# Patient Record
Sex: Male | Born: 1935 | Race: White | Hispanic: No | Smoking: Former smoker
Health system: Southern US, Community
[De-identification: ages and names within clinical notes are randomized; demographics above are authoritative.]

## PROBLEM LIST (undated history)

## (undated) DIAGNOSIS — I10 Essential (primary) hypertension: Secondary | ICD-10-CM

## (undated) DIAGNOSIS — K219 Gastro-esophageal reflux disease without esophagitis: Secondary | ICD-10-CM

## (undated) HISTORY — PX: INNER EAR SURGERY: SHX679

## (undated) HISTORY — PX: TONSILLECTOMY: SHX5217

## (undated) HISTORY — DX: Gastro-esophageal reflux disease without esophagitis: K21.9

## (undated) HISTORY — DX: Essential (primary) hypertension: I10

---

## 2007-04-30 ENCOUNTER — Ambulatory Visit: Payer: Self-pay | Admitting: Gastroenterology

## 2008-07-25 HISTORY — PX: COLONOSCOPY: SHX174

## 2009-11-05 ENCOUNTER — Observation Stay: Payer: Self-pay | Admitting: Internal Medicine

## 2011-04-11 ENCOUNTER — Ambulatory Visit: Payer: Self-pay | Admitting: Surgery

## 2011-04-25 ENCOUNTER — Ambulatory Visit: Payer: Self-pay | Admitting: Surgery

## 2012-02-06 ENCOUNTER — Ambulatory Visit: Payer: Self-pay | Admitting: Family Medicine

## 2012-02-22 ENCOUNTER — Ambulatory Visit: Payer: Self-pay | Admitting: Family Medicine

## 2015-05-18 ENCOUNTER — Ambulatory Visit (INDEPENDENT_AMBULATORY_CARE_PROVIDER_SITE_OTHER): Payer: Medicare Other | Admitting: Family Medicine

## 2015-05-18 ENCOUNTER — Encounter: Payer: Self-pay | Admitting: Family Medicine

## 2015-05-18 VITALS — BP 118/80 | HR 78 | Ht 73.0 in | Wt 176.0 lb

## 2015-05-18 DIAGNOSIS — K219 Gastro-esophageal reflux disease without esophagitis: Secondary | ICD-10-CM

## 2015-05-18 DIAGNOSIS — Z23 Encounter for immunization: Secondary | ICD-10-CM

## 2015-05-18 DIAGNOSIS — I10 Essential (primary) hypertension: Secondary | ICD-10-CM

## 2015-05-18 DIAGNOSIS — R079 Chest pain, unspecified: Secondary | ICD-10-CM

## 2015-05-18 DIAGNOSIS — F430 Acute stress reaction: Secondary | ICD-10-CM

## 2015-05-18 MED ORDER — RANITIDINE HCL 300 MG PO CAPS: 300.0000 mg | ORAL_CAPSULE | Freq: Every evening | ORAL | Status: DC

## 2015-05-18 MED ORDER — LISINOPRIL 10 MG PO TABS: 10.0000 mg | ORAL_TABLET | Freq: Every day | ORAL | Status: DC

## 2015-05-18 NOTE — Progress Notes (Signed)
Name: Jonathan Salinas   MRN: [id number]    DOB: 09-Mar-1936   Date:05/18/2015       Progress Note  Subjective  Chief Complaint  Chief Complaint  Patient presents with  . Depression  . Gastroesophageal Reflux    started taking acid reflux med- improved symptoms  . Chest Pain    Depression        This is a chronic problem.  The current episode started more than 1 month ago.   The onset quality is gradual.   The problem occurs daily.  The problem has been waxing and waning since onset.  Associated symptoms include irritable, indigestion and sad.  Associated symptoms include no decreased concentration, no fatigue, no helplessness, no hopelessness, does not have insomnia, no restlessness, no decreased interest, no appetite change, no body aches, no myalgias, no headaches and no suicidal ideas.( irritated with political circumstances)     The symptoms are aggravated by family issues.  Past treatments include nothing.  Past medical history includes anxiety.     Pertinent negatives include no chronic fatigue syndrome and no chronic pain. Gastroesophageal Reflux He complains of chest pain, coughing, heartburn and wheezing. He reports no abdominal pain, no belching, no choking, no dysphagia, no early satiety, no globus sensation, no hoarse voice, no nausea, no sore throat or no stridor. This is a recurrent problem. The current episode started more than 1 year ago. The problem occurs occasionally. The problem has been waxing and waning. The heartburn duration is several minutes. The symptoms are aggravated by certain foods. Pertinent negatives include no anemia, fatigue, melena, muscle weakness, orthopnea or weight loss. He has tried a histamine-2 antagonist for the symptoms. The treatment provided moderate relief. Past procedures include an EGD. 2008.  Chest Pain  This is a recurrent problem. The current episode started in the past 7 days. The onset quality is gradual. The problem occurs intermittently. The  problem has been waxing and waning. The pain is present in the lateral region. Quality: soreness. Associated symptoms include a cough. Pertinent negatives include no abdominal pain, back pain, claudication, diaphoresis, dizziness, exertional chest pressure, fever, headaches, hemoptysis, irregular heartbeat, leg pain, lower extremity edema, malaise/fatigue, nausea, near-syncope, numbness, orthopnea, palpitations, PND, shortness of breath, sputum production, syncope, vomiting or weakness. The cough is non-productive. Nothing worsens the cough.  His past medical history is significant for hypertension.  Pertinent negatives for past medical history include no muscle weakness and no PVD.  Hypertension This is a chronic problem. The current episode started more than 1 year ago. The problem has been gradually improving since onset. The problem is controlled. Associated symptoms include anxiety and chest pain. Pertinent negatives include no blurred vision, headaches, malaise/fatigue, neck pain, orthopnea, palpitations, peripheral edema, PND, shortness of breath or sweats. There are no associated agents to hypertension. Past treatments include ACE inhibitors. The current treatment provides moderate improvement. There are no compliance problems.  There is no history of angina, kidney disease, CVA, heart failure, PVD or retinopathy.    No problem-specific assessment & plan notes found for this encounter.   Past Medical History  Diagnosis Date  . GERD (gastroesophageal reflux disease)   . Hypertension     Past Surgical History  Procedure Laterality Date  . Inner ear surgery      had a perforated eardrum  . Tonsillectomy    . Colonoscopy  2010    cleared for 10 yrs    Family History  Problem Relation Age of Onset  .  Cancer Mother   . Cancer Father     Social History   Social History  . Marital Status: Divorced    Spouse Name: N/A  . Number of Children: N/A  . Years of Education: N/A    Occupational History  . Not on file.   Social History Main Topics  . Smoking status: Former Games developermoker  . Smokeless tobacco: Not on file  . Alcohol Use: No  . Drug Use: No  . Sexual Activity: Yes   Other Topics Concern  . Not on file   Social History Narrative  . No narrative on file    Allergies  Allergen Reactions  . Penicillins      Review of Systems  Constitutional: Negative for fever, chills, weight loss, malaise/fatigue, diaphoresis, appetite change and fatigue.  HENT: Negative for hoarse voice and sore throat.   Eyes: Negative for blurred vision.  Respiratory: Positive for cough and wheezing. Negative for hemoptysis, sputum production, choking and shortness of breath.   Cardiovascular: Positive for chest pain. Negative for palpitations, orthopnea, claudication, leg swelling, syncope, PND and near-syncope.  Gastrointestinal: Positive for heartburn. Negative for dysphagia, nausea, vomiting, abdominal pain, blood in stool and melena.  Musculoskeletal: Negative for myalgias, back pain, muscle weakness and neck pain.  Skin: Negative for rash.  Neurological: Negative for dizziness, weakness, numbness and headaches.  Psychiatric/Behavioral: Positive for depression. Negative for suicidal ideas and decreased concentration. The patient does not have insomnia.      Objective  Filed Vitals:   05/18/15 0931  BP: 118/80  Pulse: 78  Height: 6\' 1"  (1.854 m)  Weight: 176 lb (79.833 kg)    Physical Exam  Constitutional: He is oriented to person, place, and time and well-developed, well-nourished, and in no distress. He is irritable.  HENT:  Head: Normocephalic.  Right Ear: External ear normal.  Left Ear: External ear normal.  Nose: Nose normal.  Mouth/Throat: Oropharynx is clear and moist.  Eyes: Conjunctivae and EOM are normal. Pupils are equal, round, and reactive to light. Right eye exhibits no discharge. Left eye exhibits no discharge. No scleral icterus.  Neck:  Normal range of motion. Neck supple. No JVD present. No tracheal deviation present. No thyromegaly present.  Cardiovascular: Normal rate, regular rhythm, normal heart sounds and intact distal pulses.  Exam reveals no gallop and no friction rub.   No murmur heard. Pulmonary/Chest: Breath sounds normal. No respiratory distress. He has no wheezes. He has no rales.  Abdominal: Soft. Bowel sounds are normal. He exhibits no mass. There is no hepatosplenomegaly. There is no tenderness. There is no rebound, no guarding and no CVA tenderness.  Musculoskeletal: Normal range of motion. He exhibits no edema or tenderness.  Lymphadenopathy:    He has no cervical adenopathy.  Neurological: He is alert and oriented to person, place, and time. He has normal sensation, normal strength, normal reflexes and intact cranial nerves. No cranial nerve deficit.  Skin: Skin is warm. No rash noted.  Psychiatric: Mood and affect normal.      Assessment & Plan  Problem List Items Addressed This Visit    None    Visit Diagnoses    Chest pain, unspecified chest pain type    -  Primary    Relevant Orders    EKG 12-Lead    Gastroesophageal reflux disease, esophagitis presence not specified        Relevant Medications    ranitidine (ZANTAC) 300 MG capsule    Essential hypertension  Relevant Medications    lisinopril (PRINIVIL,ZESTRIL) 10 MG tablet    Other Relevant Orders    Renal Function Panel    Transient situational disturbance        death of brother    Need for influenza vaccination        Relevant Orders    Flu Vaccine QUAD 36+ mos PF IM (Fluarix & Fluzone Quad PF)         Dr. Hayden [facility]  05/18/2015

## 2015-05-19 LAB — RENAL FUNCTION PANEL
ALBUMIN: 4.6 g/dL (ref 3.5–4.8)
BUN/Creatinine Ratio: 13 (ref 10–22)
BUN: 19 mg/dL (ref 8–27)
CO2: 21 mmol/L (ref 18–29)
Calcium: 9.4 mg/dL (ref 8.6–10.2)
Chloride: 102 mmol/L (ref 97–106)
Creatinine, Ser: 1.41 mg/dL — ABNORMAL HIGH (ref 0.76–1.27)
GFR calc non Af Amer: 48 mL/min/{1.73_m2} — ABNORMAL LOW (ref >59)
GFR, EST AFRICAN AMERICAN: 55 mL/min/{1.73_m2} — AB (ref >59)
Glucose: 91 mg/dL (ref 65–99)
PHOSPHORUS: 2.5 mg/dL (ref 2.5–4.5)
Potassium: 4 mmol/L (ref 3.5–5.2)
Sodium: 141 mmol/L (ref 136–144)

## 2015-08-25 ENCOUNTER — Ambulatory Visit (INDEPENDENT_AMBULATORY_CARE_PROVIDER_SITE_OTHER): Payer: Medicare Other | Admitting: Family Medicine

## 2015-08-25 ENCOUNTER — Encounter: Payer: Self-pay | Admitting: Family Medicine

## 2015-08-25 VITALS — BP 120/80 | HR 80 | Temp 97.8°F | Ht 73.0 in | Wt 175.0 lb

## 2015-08-25 DIAGNOSIS — J40 Bronchitis, not specified as acute or chronic: Secondary | ICD-10-CM

## 2015-08-25 DIAGNOSIS — J0100 Acute maxillary sinusitis, unspecified: Secondary | ICD-10-CM

## 2015-08-25 MED ORDER — AZITHROMYCIN 250 MG PO TABS: ORAL_TABLET | ORAL | Status: DC

## 2015-08-25 NOTE — Progress Notes (Signed)
Name: Jonathan Salinas   MRN: [id number]    DOB: 1935/09/06   Date:08/25/2015       Progress Note  Subjective  Chief Complaint  Chief Complaint  Patient presents with  . Sinusitis    cough and cong- clear/ thick production and headache across front of forehead on brow line    Sinusitis This is a recurrent problem. The current episode started 1 to 4 weeks ago. The problem has been waxing and waning since onset. There has been no fever. The pain is mild. Pertinent negatives include no chills, congestion, coughing, diaphoresis, ear pain, headaches, hoarse voice, neck pain, shortness of breath, sinus pressure, sneezing, sore throat or swollen glands. Past treatments include nothing. The treatment provided moderate relief.  Cough This is a new problem. The current episode started in the past 7 days. The problem has been gradually worsening. The cough is non-productive. Associated symptoms include nasal congestion. Pertinent negatives include no chest pain, chills, ear congestion, ear pain, fever, headaches, heartburn, hemoptysis, myalgias, rash, sore throat, shortness of breath, weight loss or wheezing. The treatment provided mild relief. There is no history of environmental allergies.    No problem-specific assessment & plan notes found for this encounter.   Past Medical History  Diagnosis Date  . GERD (gastroesophageal reflux disease)   . Hypertension     Past Surgical History  Procedure Laterality Date  . Inner ear surgery      had a perforated eardrum  . Tonsillectomy    . Colonoscopy  2010    cleared for 10 yrs    Family History  Problem Relation Age of Onset  . Cancer Mother   . Cancer Father     Social History   Social History  . Marital Status: Divorced    Spouse Name: N/A  . Number of Children: N/A  . Years of Education: N/A   Occupational History  . Not on file.   Social History Main Topics  . Smoking status: Former Games developer  . Smokeless tobacco: Not on file   . Alcohol Use: No  . Drug Use: No  . Sexual Activity: Yes   Other Topics Concern  . Not on file   Social History Narrative    Allergies  Allergen Reactions  . Penicillins      Review of Systems  Constitutional: Negative for fever, chills, weight loss, malaise/fatigue and diaphoresis.  HENT: Negative for congestion, ear discharge, ear pain, hoarse voice, sinus pressure, sneezing and sore throat.   Eyes: Negative for blurred vision.  Respiratory: Negative for cough, hemoptysis, sputum production, shortness of breath and wheezing.   Cardiovascular: Negative for chest pain, palpitations and leg swelling.  Gastrointestinal: Negative for heartburn, nausea, abdominal pain, diarrhea, constipation, blood in stool and melena.  Genitourinary: Negative for dysuria, urgency, frequency and hematuria.  Musculoskeletal: Negative for myalgias, back pain, joint pain and neck pain.  Skin: Negative for rash.  Neurological: Negative for dizziness, tingling, sensory change, focal weakness and headaches.  Endo/Heme/Allergies: Negative for environmental allergies and polydipsia. Does not bruise/bleed easily.  Psychiatric/Behavioral: Negative for depression and suicidal ideas. The patient is not nervous/anxious and does not have insomnia.      Objective  Filed Vitals:   08/25/15 0941  BP: 120/80  Pulse: 80  Temp: 97.8 F (36.6 C)  TempSrc: Oral  Height:  (1.854 m)  Weight: 175 lb (79.379 kg)    Physical Exam  Constitutional: He is oriented to person, place, and time and well-developed, well-nourished,  and in no distress.  HENT:  Head: Normocephalic.  Right Ear: External ear normal.  Left Ear: External ear normal.  Nose: Nose normal.  Mouth/Throat: Oropharynx is clear and moist.  Eyes: Conjunctivae and EOM are normal. Pupils are equal, round, and reactive to light. Right eye exhibits no discharge. Left eye exhibits no discharge. No scleral icterus.  Neck: Normal range of motion.  Neck supple. No JVD present. No tracheal deviation present. No thyromegaly present.  Cardiovascular: Normal rate, regular rhythm, normal heart sounds and intact distal pulses.  Exam reveals no gallop and no friction rub.   No murmur heard. Pulmonary/Chest: Breath sounds normal. No respiratory distress. He has no wheezes. He has no rales.  Abdominal: Soft. Bowel sounds are normal. He exhibits no mass. There is no hepatosplenomegaly. There is no tenderness. There is no rebound, no guarding and no CVA tenderness.  Musculoskeletal: Normal range of motion. He exhibits no edema or tenderness.  Lymphadenopathy:    He has no cervical adenopathy.  Neurological: He is alert and oriented to person, place, and time. He has normal sensation, normal strength, normal reflexes and intact cranial nerves. No cranial nerve deficit.  Skin: Skin is warm. No rash noted.  Psychiatric: Mood and affect normal.  Nursing note and vitals reviewed.     Assessment & Plan  Problem List Items Addressed This Visit    None    Visit Diagnoses    Acute maxillary sinusitis, recurrence not specified    -  Primary    Relevant Medications    azithromycin (ZITHROMAX) 250 MG tablet    Bronchitis        Relevant Medications    azithromycin (ZITHROMAX) 250 MG tablet         Dr. Hayden [facility]  08/25/2015

## 2015-11-03 ENCOUNTER — Other Ambulatory Visit: Payer: Self-pay | Admitting: Family Medicine

## 2015-11-20 ENCOUNTER — Ambulatory Visit (INDEPENDENT_AMBULATORY_CARE_PROVIDER_SITE_OTHER): Payer: Medicare Other | Admitting: Family Medicine

## 2015-11-20 ENCOUNTER — Encounter: Payer: Self-pay | Admitting: Family Medicine

## 2015-11-20 VITALS — BP 130/80 | HR 76 | Ht 73.0 in | Wt 175.0 lb

## 2015-11-20 DIAGNOSIS — S338XXA Sprain of other parts of lumbar spine and pelvis, initial encounter: Secondary | ICD-10-CM | POA: Diagnosis not present

## 2015-11-20 DIAGNOSIS — S39012A Strain of muscle, fascia and tendon of lower back, initial encounter: Secondary | ICD-10-CM

## 2015-11-20 LAB — POCT URINALYSIS DIPSTICK
BILIRUBIN UA: NEGATIVE
Blood, UA: NEGATIVE
GLUCOSE UA: NEGATIVE
Ketones, UA: NEGATIVE
LEUKOCYTES UA: NEGATIVE
NITRITE UA: NEGATIVE
PH UA: 6
PROTEIN UA: NEGATIVE
Spec Grav, UA: 1.02
Urobilinogen, UA: 0.2

## 2015-11-20 MED ORDER — MELOXICAM 15 MG PO TABS: 15.0000 mg | ORAL_TABLET | Freq: Every day | ORAL | Status: DC

## 2015-11-20 MED ORDER — CYCLOBENZAPRINE HCL 10 MG PO TABS: 10.0000 mg | ORAL_TABLET | Freq: Three times a day (TID) | ORAL | Status: DC | PRN

## 2015-11-20 NOTE — Progress Notes (Signed)
Name: Jonathan Salinas   MRN: [id number]    DOB: [birth year]   Date:11/20/2015       Progress Note  Subjective  Chief Complaint  Chief Complaint  Patient presents with  . Back Pain    "bending over a car to put a motor in on Monday"- has hurt in lower back around to front    Back Pain This is a new problem. The current episode started in the past 7 days. The problem occurs daily. The problem has been gradually worsening since onset. The pain is present in the lumbar spine. The quality of the pain is described as aching. The pain does not radiate. The pain is at a severity of 8/10. The pain is mild. The pain is the same all the time. The symptoms are aggravated by bending, sitting and twisting. Stiffness is present in the morning. Pertinent negatives include no abdominal pain, bladder incontinence, bowel incontinence, chest pain, dysuria, fever, headaches, numbness, paresis, paresthesias, tingling, weakness or weight loss. He has tried NSAIDs and muscle relaxant for the symptoms. The treatment provided no relief.    No problem-specific assessment & plan notes found for this encounter.   Past Medical History  Diagnosis Date  . GERD (gastroesophageal reflux disease)   . Hypertension     Past Surgical History  Procedure Laterality Date  . Inner ear surgery      had a perforated eardrum  . Tonsillectomy    . Colonoscopy  2010    cleared for 10 yrs    Family History  Problem Relation Age of Onset  . Cancer Mother   . Cancer Father     Social History   Social History  . Marital Status: Divorced    Spouse Name: N/A  . Number of Children: N/A  . Years of Education: N/A   Occupational History  . Not on file.   Social History Main Topics  . Smoking status: Former Games developermoker  . Smokeless tobacco: Not on file  . Alcohol Use: No  . Drug Use: No  . Sexual Activity: Yes   Other Topics Concern  . Not on file   Social History Narrative    Allergies  Allergen Reactions  .  Penicillins      Review of Systems  Constitutional: Negative for fever, chills, weight loss and malaise/fatigue.  HENT: Negative for ear discharge, ear pain and sore throat.   Eyes: Negative for blurred vision.  Respiratory: Negative for cough, sputum production, shortness of breath and wheezing.   Cardiovascular: Negative for chest pain, palpitations and leg swelling.  Gastrointestinal: Negative for heartburn, nausea, abdominal pain, diarrhea, constipation, blood in stool, melena and bowel incontinence.  Genitourinary: Negative for bladder incontinence, dysuria, urgency, frequency and hematuria.  Musculoskeletal: Positive for back pain. Negative for myalgias, joint pain and neck pain.  Skin: Negative for rash.  Neurological: Negative for dizziness, tingling, sensory change, focal weakness, weakness, numbness, headaches and paresthesias.  Endo/Heme/Allergies: Negative for environmental allergies and polydipsia. Does not bruise/bleed easily.  Psychiatric/Behavioral: Negative for depression and suicidal ideas. The patient is not nervous/anxious and does not have insomnia.      Objective  Filed Vitals:   11/20/15 1429  BP: 130/80  Pulse: 76  Height: 6\' 1"  (1.854 m)  Weight: 175 lb (79.379 kg)    Physical Exam  Constitutional: He is oriented to person, place, and time and well-developed, well-nourished, and in no distress.  HENT:  Head: Normocephalic.  Right Ear: External ear normal.  Left  Ear: External ear normal.  Nose: Nose normal.  Mouth/Throat: Oropharynx is clear and moist.  Eyes: Conjunctivae and EOM are normal. Pupils are equal, round, and reactive to light. Right eye exhibits no discharge. Left eye exhibits no discharge. No scleral icterus.  Neck: Normal range of motion. Neck supple. No JVD present. No tracheal deviation present. No thyromegaly present.  Cardiovascular: Normal rate, regular rhythm, normal heart sounds and intact distal pulses.  Exam reveals no gallop and  no friction rub.   No murmur heard. Pulmonary/Chest: Breath sounds normal. No respiratory distress. He has no wheezes. He has no rales.  Abdominal: Soft. Bowel sounds are normal. He exhibits no mass. There is no hepatosplenomegaly. There is no tenderness. There is no rebound, no guarding and no CVA tenderness.  Genitourinary: Testes/scrotum normal.  Musculoskeletal: Normal range of motion. He exhibits no edema or tenderness.       Lumbar back: He exhibits spasm. He exhibits no tenderness.  Lymphadenopathy:    He has no cervical adenopathy.  Neurological: He is alert and oriented to person, place, and time. He has normal sensation, normal strength, normal reflexes and intact cranial nerves. No cranial nerve deficit.  Skin: Skin is warm. No rash noted.  Psychiatric: Mood and affect normal.  Nursing note and vitals reviewed.     Assessment & Plan  Problem List Items Addressed This Visit    None    Visit Diagnoses    Lumbosacral strain, initial encounter    -  Primary    Relevant Medications    meloxicam (MOBIC) 15 MG tablet    cyclobenzaprine (FLEXERIL) 10 MG tablet    Other Relevant Orders    POCT Urinalysis Dipstick (Completed)         Dr. Hayden [facility]  11/20/2015

## 2015-11-21 ENCOUNTER — Other Ambulatory Visit: Payer: Self-pay | Admitting: Family Medicine

## 2015-12-20 ENCOUNTER — Other Ambulatory Visit: Payer: Self-pay | Admitting: Family Medicine

## 2016-01-27 ENCOUNTER — Other Ambulatory Visit: Payer: Self-pay | Admitting: Family Medicine

## 2016-02-10 ENCOUNTER — Other Ambulatory Visit: Payer: Self-pay

## 2016-02-10 MED ORDER — MELOXICAM 15 MG PO TABS: 15.0000 mg | ORAL_TABLET | Freq: Every day | ORAL | Status: DC

## 2016-03-09 ENCOUNTER — Other Ambulatory Visit: Payer: Self-pay | Admitting: Family Medicine

## 2016-04-06 ENCOUNTER — Other Ambulatory Visit: Payer: Self-pay | Admitting: Family Medicine

## 2016-05-04 ENCOUNTER — Other Ambulatory Visit: Payer: Self-pay | Admitting: Family Medicine

## 2016-05-05 ENCOUNTER — Other Ambulatory Visit: Payer: Self-pay | Admitting: Family Medicine

## 2016-05-10 ENCOUNTER — Other Ambulatory Visit: Payer: Self-pay | Admitting: Family Medicine

## 2016-05-10 ENCOUNTER — Other Ambulatory Visit: Payer: Self-pay

## 2016-05-10 MED ORDER — LISINOPRIL 10 MG PO TABS: 10.0000 mg | ORAL_TABLET | Freq: Every day | ORAL | 0 refills | Status: DC

## 2016-06-08 ENCOUNTER — Other Ambulatory Visit: Payer: Self-pay | Admitting: Family Medicine

## 2016-06-28 ENCOUNTER — Ambulatory Visit (INDEPENDENT_AMBULATORY_CARE_PROVIDER_SITE_OTHER): Payer: Medicare Other | Admitting: Family Medicine

## 2016-06-28 ENCOUNTER — Encounter: Payer: Self-pay | Admitting: Family Medicine

## 2016-06-28 VITALS — BP 136/82 | HR 76 | Temp 98.0°F | Ht 73.0 in | Wt 174.0 lb

## 2016-06-28 DIAGNOSIS — I10 Essential (primary) hypertension: Secondary | ICD-10-CM | POA: Diagnosis not present

## 2016-06-28 DIAGNOSIS — Z1211 Encounter for screening for malignant neoplasm of colon: Secondary | ICD-10-CM | POA: Diagnosis not present

## 2016-06-28 DIAGNOSIS — N400 Enlarged prostate without lower urinary tract symptoms: Secondary | ICD-10-CM | POA: Diagnosis not present

## 2016-06-28 LAB — HEMOCCULT GUIAC POC 1CARD (OFFICE): Fecal Occult Blood, POC: POSITIVE — AB

## 2016-06-28 MED ORDER — LISINOPRIL 10 MG PO TABS: 10.0000 mg | ORAL_TABLET | Freq: Every day | ORAL | 3 refills | Status: DC

## 2016-06-28 NOTE — Progress Notes (Signed)
Name: Virgilio BellingJackie C Reece   MRN: [id number]    DOB: 11-22-1935   Date:06/28/2016       Progress Note  Subjective  Chief Complaint  Chief Complaint  Patient presents with  . Medication Refill    refill Rx lisinopril    Medication Refill  This is a chronic problem. The current episode started more than 1 year ago. The problem occurs daily. The problem has been unchanged. Pertinent negatives include no abdominal pain, chest pain, chills, coughing, fever, headaches, myalgias, nausea, neck pain, rash or sore throat. Nothing aggravates the symptoms. He has tried nothing for the symptoms. The treatment provided mild relief.  Hypertension  This is a chronic problem. The current episode started more than 1 year ago. The problem has been gradually improving since onset. The problem is controlled. Pertinent negatives include no anxiety, blurred vision, chest pain, headaches, malaise/fatigue, neck pain, orthopnea, palpitations, peripheral edema, PND, shortness of breath or sweats. There are no associated agents to hypertension. There are no known risk factors for coronary artery disease. Past treatments include ACE inhibitors. The current treatment provides mild improvement. There are no compliance problems.  There is no history of angina, kidney disease, CAD/MI, CVA, heart failure, left ventricular hypertrophy, PVD, renovascular disease or retinopathy. There is no history of chronic renal disease or a hypertension causing med.    No problem-specific Assessment & Plan notes found for this encounter.   Past Medical History:  Diagnosis Date  . GERD (gastroesophageal reflux disease)   . Hypertension     Past Surgical History:  Procedure Laterality Date  . COLONOSCOPY  2010   cleared for 10 yrs  . INNER EAR SURGERY     had a perforated eardrum  . TONSILLECTOMY      Family History  Problem Relation Age of Onset  . Cancer Mother   . Cancer Father     Social History   Social History  . Marital  status: Divorced    Spouse name: N/A  . Number of children: N/A  . Years of education: N/A   Occupational History  . Not on file.   Social History Main Topics  . Smoking status: Former Games developermoker  . Smokeless tobacco: Not on file  . Alcohol use No  . Drug use: No  . Sexual activity: Yes   Other Topics Concern  . Not on file   Social History Narrative  . No narrative on file    Allergies  Allergen Reactions  . Penicillins      Review of Systems  Constitutional: Negative for chills, fever, malaise/fatigue and weight loss.  HENT: Negative for ear discharge, ear pain and sore throat.   Eyes: Negative for blurred vision.  Respiratory: Negative for cough, sputum production, shortness of breath and wheezing.   Cardiovascular: Negative for chest pain, palpitations, orthopnea, leg swelling and PND.  Gastrointestinal: Negative for abdominal pain, blood in stool, constipation, diarrhea, heartburn, melena and nausea.  Genitourinary: Negative for dysuria, frequency, hematuria and urgency.  Musculoskeletal: Negative for back pain, joint pain, myalgias and neck pain.  Skin: Negative for rash.  Neurological: Negative for dizziness, tingling, sensory change, focal weakness and headaches.  Endo/Heme/Allergies: Negative for environmental allergies and polydipsia. Does not bruise/bleed easily.  Psychiatric/Behavioral: Negative for depression and suicidal ideas. The patient is not nervous/anxious and does not have insomnia.      Objective  Vitals:   06/28/16 1006  BP: 136/82  Pulse: 76  Temp: 98 F (36.7 C)  Weight: 174  lb (78.9 kg)  Height: 6\' 1"  (1.854 m)    Physical Exam  Constitutional: He is oriented to person, place, and time and well-developed, well-nourished, and in no distress.  HENT:  Head: Normocephalic.  Right Ear: External ear normal.  Left Ear: External ear normal.  Nose: Nose normal.  Mouth/Throat: Oropharynx is clear and moist.  Eyes: Conjunctivae and EOM are  normal. Pupils are equal, round, and reactive to light. Right eye exhibits no discharge. Left eye exhibits no discharge. No scleral icterus.  Neck: Normal range of motion. Neck supple. No JVD present. No tracheal deviation present. No thyromegaly present.  Cardiovascular: Normal rate, regular rhythm, normal heart sounds and intact distal pulses.  Exam reveals no gallop and no friction rub.   No murmur heard. Pulmonary/Chest: Breath sounds normal. No respiratory distress. He has no wheezes. He has no rales.  Abdominal: Soft. Bowel sounds are normal. He exhibits no mass. There is no hepatosplenomegaly. There is no tenderness. There is no rebound, no guarding and no CVA tenderness.  Genitourinary: Rectum normal. Prostate is enlarged.  Musculoskeletal: Normal range of motion. He exhibits no edema or tenderness.  Lymphadenopathy:    He has no cervical adenopathy.  Neurological: He is alert and oriented to person, place, and time. He has normal sensation, normal strength, normal reflexes and intact cranial nerves. No cranial nerve deficit.  Skin: Skin is warm. No rash noted.  Psychiatric: Mood and affect normal.  Nursing note and vitals reviewed.     Assessment & Plan  Problem List Items Addressed This Visit    None    Visit Diagnoses    Essential hypertension    -  Primary   Relevant Medications   lisinopril (PRINIVIL,ZESTRIL) 10 MG tablet   Other Relevant Orders   Renal Function Panel   Enlarged prostate       Relevant Orders   PSA   Screen for colon cancer       pt desires to wait before referral   Relevant Orders   POCT Occult Blood Stool (Completed)        Dr. Elizabeth Sauereanna Mollye [facility]  06/28/16

## 2016-06-29 LAB — RENAL FUNCTION PANEL
ALBUMIN: 4.4 g/dL (ref 3.5–4.8)
BUN/Creatinine Ratio: 14 (ref 10–24)
BUN: 19 mg/dL (ref 8–27)
CALCIUM: 9.5 mg/dL (ref 8.6–10.2)
CHLORIDE: 103 mmol/L (ref 96–106)
CO2: 24 mmol/L (ref 18–29)
Creatinine, Ser: 1.38 mg/dL — ABNORMAL HIGH (ref 0.76–1.27)
GFR calc Af Amer: 56 mL/min/{1.73_m2} — ABNORMAL LOW (ref >59)
GFR calc non Af Amer: 49 mL/min/{1.73_m2} — ABNORMAL LOW (ref >59)
GLUCOSE: 98 mg/dL (ref 65–99)
PHOSPHORUS: 2.6 mg/dL (ref 2.5–4.5)
POTASSIUM: 4.5 mmol/L (ref 3.5–5.2)
Sodium: 141 mmol/L (ref 134–144)

## 2016-06-29 LAB — PSA: PROSTATE SPECIFIC AG, SERUM: 5.1 ng/mL — AB (ref 0.0–4.0)

## 2016-06-30 ENCOUNTER — Other Ambulatory Visit: Payer: Self-pay | Admitting: Family Medicine

## 2016-06-30 ENCOUNTER — Other Ambulatory Visit: Payer: Self-pay | Admitting: *Deleted

## 2016-06-30 DIAGNOSIS — N400 Enlarged prostate without lower urinary tract symptoms: Secondary | ICD-10-CM

## 2016-07-04 ENCOUNTER — Other Ambulatory Visit: Payer: Self-pay

## 2016-07-04 DIAGNOSIS — Z1211 Encounter for screening for malignant neoplasm of colon: Secondary | ICD-10-CM

## 2016-07-29 ENCOUNTER — Other Ambulatory Visit: Payer: Self-pay | Admitting: Family Medicine

## 2016-07-29 ENCOUNTER — Encounter: Payer: Self-pay | Admitting: Urology

## 2016-07-29 ENCOUNTER — Ambulatory Visit (INDEPENDENT_AMBULATORY_CARE_PROVIDER_SITE_OTHER): Payer: Medicare Other | Admitting: Urology

## 2016-07-29 VITALS — BP 143/91 | HR 94 | Ht 72.0 in | Wt 175.0 lb

## 2016-07-29 DIAGNOSIS — R972 Elevated prostate specific antigen [PSA]: Secondary | ICD-10-CM | POA: Diagnosis not present

## 2016-07-29 DIAGNOSIS — N400 Enlarged prostate without lower urinary tract symptoms: Secondary | ICD-10-CM

## 2016-07-29 DIAGNOSIS — R351 Nocturia: Secondary | ICD-10-CM | POA: Diagnosis not present

## 2016-07-29 NOTE — Progress Notes (Signed)
07/29/2016 2:42 PM   Virgilio BellingJackie C Hanrahan [birth year] [id number]  Referring provider: Duanne Limerickeanna C Jones, MD [address] [place], [place] [address]  Chief Complaint  Patient presents with  . New Patient (Initial Visit)    HPI:  81 year old male referred for further evaluation of elevated PSA and enlarged prostate on rectal exam. He underwent routine screening by his primary care physician, Dr. Yetta BarreJones at which time an enlarged prostate was noted on digital rectal exam. In addition, he is found to have a PSA of 5.1 on 06/28/2016.  Unfortunately, there are no previous PSA values to review for comparison.  He notes that he had one several years ago which was not elevated.  No family history of prostate cancer.  He denies any significant urinary symptoms. He does have nocturia 2-3 but states this is from drinking water just before bedtime. He has minimal bother from this. Excellent urinary stream, he believes he empties his bladder well.  No urgency or frequency. No gross hematuria or urinary tract infections. No issues with bladder stones.  He does have baseline CKD stage III, which is stable 1.38, stable from 1.4 year ago.   PMH: Past Medical History:  Diagnosis Date  . GERD (gastroesophageal reflux disease)   . Hypertension     Surgical History: Past Surgical History:  Procedure Laterality Date  . COLONOSCOPY  2010   cleared for 10 yrs  . INNER EAR SURGERY     had a perforated eardrum  . TONSILLECTOMY      Home Medications:  Allergies as of 07/29/2016      Reactions   Penicillins       Medication List       Accurate as of 07/29/16  2:42 PM. Always use your most recent med list.          cyclobenzaprine 10 MG tablet Commonly known as:  FLEXERIL Take 1 tablet (10 mg total) by mouth 3 (three) times daily as needed for muscle spasms.   lisinopril 10 MG tablet Commonly known as:  PRINIVIL,ZESTRIL Take 1 tablet (10 mg total) by mouth daily.   meloxicam 15 MG  tablet Commonly known as:  MOBIC TAKE 1 TABLET (15 MG TOTAL) BY MOUTH DAILY.   ranitidine 300 MG capsule Commonly known as:  ZANTAC Take 1 capsule (300 mg total) by mouth every evening.   ranitidine 300 MG tablet Commonly known as:  ZANTAC TAKE 1 TABLET (300 MG TOTAL) BY MOUTH EVERY EVENING.       Allergies:  Allergies  Allergen Reactions  . Penicillins     Family History: Family History  Problem Relation Age of Onset  . Cancer Mother   . Cancer Father   . Prostate cancer Neg Hx   . Bladder Cancer Neg Hx   . Kidney cancer Neg Hx     Social History:  reports that he has quit smoking. He has never used smokeless tobacco. He reports that he does not drink alcohol or use drugs.  ROS: UROLOGY Frequent Urination?: No Hard to postpone urination?: No Burning/pain with urination?: No Get up at night to urinate?: No Leakage of urine?: No Urine stream starts and stops?: No Trouble starting stream?: No Do you have to strain to urinate?: No Blood in urine?: No Urinary tract infection?: No Sexually transmitted disease?: No Injury to kidneys or bladder?: No Painful intercourse?: No Weak stream?: No Erection problems?: No Penile pain?: No  Gastrointestinal Nausea?: No Vomiting?: No Indigestion/heartburn?: No Diarrhea?: No Constipation?:  No  Constitutional Fever: No Night sweats?: No Weight loss?: No Fatigue?: No  Skin Skin rash/lesions?: No Itching?: No  Eyes Blurred vision?: No Double vision?: No  Ears/Nose/Throat Sore throat?: No Sinus problems?: No  Hematologic/Lymphatic Swollen glands?: No Easy bruising?: No  Cardiovascular Leg swelling?: No Chest pain?: No  Respiratory Cough?: No Shortness of breath?: No  Endocrine Excessive thirst?: No  Musculoskeletal Back pain?: No Joint pain?: No  Neurological Headaches?: No Dizziness?: No  Psychologic Depression?: No Anxiety?: No  Physical Exam: BP (!) 143/91 (BP Location: Right Arm,  Patient Position: Sitting, Cuff Size: Normal)   Pulse 94   Ht 6' (1.829 m)   Wt 175 lb (79.4 kg)   BMI 23.73 kg/m   Constitutional:  Alert and oriented, No acute distress. HEENT:  AT, moist mucus membranes.  Trachea midline, no masses. Cardiovascular: No clubbing, cyanosis, or edema. Respiratory: Normal respiratory effort, no increased work of breathing. GI: Abdomen is soft, nontender, nondistended, no abdominal masses GU: No CVA tenderness.  Rectal exam: Normal sphincter tone. Enlarged 50+ cc prostate, nontender, no nodules. Skin: No rashes, bruises or suspicious lesions. Neurologic: Grossly intact, no focal deficits, moving all 4 extremities. Psychiatric: Normal mood and affect.  Laboratory Data:  Lab Results  Component Value Date   CREATININE 1.38 (H) 06/28/2016   Component     Latest Ref Rng & Units 06/28/2016  PSA     0.0 - 4.0 ng/mL 5.1 (H)    Urinalysis    Component Value Date/Time   BILIRUBINUR neg 11/20/2015 1511   PROTEINUR neg 11/20/2015 1511   UROBILINOGEN 0.2 11/20/2015 1511   NITRITE neg 11/20/2015 1511   LEUKOCYTESUR Negative 11/20/2015 1511    Pertinent Imaging: n/a  Assessment & Plan:    1. Elevated PSA Elevated PSA to 5.1, no previous PSA values for comparison   We reviewed the implications of an elevated PSA and the uncertainty surrounding it. In general, a man's PSA increases with age and is produced by both normal and cancerous prostate tissue. Differential for elevated PSA is BPH, prostate cancer, infection, recent intercourse/ejaculation, prostate infarction, recent urethroscopic manipulation (foley placement/cystoscopy) and prostatitis. Management of an elevated PSA can include observation or prostate biopsy and wediscussed this in detail.  We discussed that indications for prostate biopsy are defined by age and race specific PSA cutoffs as well as a PSA velocity of 0.75/year.  Generally, PSA screening is stopped around age 90-75 based on  the patient's overall health. In this situation with a slightly elevated PSA and unknown previous trend, I recommended follow-up PSA in 6 months for further observation. Rectal exam was enlarged, may be an appropriate PSA density.  He is agreeable with this plan.   2. Benign prostatic hyperplasia without lower urinary tract symptoms Enlarged gland on exam today, otherwise asymptomatic No intervention recommended given lack of symptoms  3. Nocturia Voids 2-3 times nightly, likely mostly behavioral Minimal bother    Return in about 6 months (around 01/26/2017) for PSA/ DRE (check labs 1 week prior to apt).  Vanna Scotland, MD  [facility] [address], [address] [place], [place] [address] [phone]

## 2017-02-09 ENCOUNTER — Other Ambulatory Visit
Admission: RE | Admit: 2017-02-09 | Discharge: 2017-02-09 | Disposition: A | Discharge status: Home / Self Care | Payer: Medicare Other | Source: Ambulatory Visit | Attending: Urology | Admitting: Urology

## 2017-02-09 DIAGNOSIS — R972 Elevated prostate specific antigen [PSA]: Secondary | ICD-10-CM | POA: Insufficient documentation

## 2017-02-09 LAB — PSA: Prostatic Specific Antigen: 4.78 ng/mL — ABNORMAL HIGH (ref 0.00–4.00)

## 2017-02-10 ENCOUNTER — Ambulatory Visit (INDEPENDENT_AMBULATORY_CARE_PROVIDER_SITE_OTHER): Payer: Medicare Other | Admitting: Urology

## 2017-02-10 ENCOUNTER — Encounter: Payer: Self-pay | Admitting: Urology

## 2017-02-10 VITALS — BP 140/77 | HR 80 | Ht 73.0 in | Wt 175.0 lb

## 2017-02-10 DIAGNOSIS — N400 Enlarged prostate without lower urinary tract symptoms: Secondary | ICD-10-CM

## 2017-02-10 DIAGNOSIS — R351 Nocturia: Secondary | ICD-10-CM | POA: Diagnosis not present

## 2017-02-10 DIAGNOSIS — R972 Elevated prostate specific antigen [PSA]: Secondary | ICD-10-CM | POA: Diagnosis not present

## 2017-02-10 NOTE — Progress Notes (Signed)
02/10/2017 1:55 PM   Jonathan Salinas 07-11-1936 [id number]  Referring provider: Duanne LimerickJones, Deanna C, MD [address] [place], [place] [address]  Chief Complaint  Patient presents with  . Elevated PSA    60month    HPI:  [age] year old male referred in 07/2016 for evaluation of elevated PSA .  At the time, his PSA of 5.1 was 06/28/2016.  Repeat PSA today is no 4.78, 6 months later. He returns today for routine follow-up.  Rectal exam on 07/2016 showed an enlarged 50+ cc prostate, nonnodular.  No family history of prostate cancer.  He denies any significant urinary symptoms. He does have nocturia 2-3 but states this is from drinking water just before bedtime. He has minimal bother from this. Excellent urinary stream, he believes he empties his bladder well.  No urgency or frequency. No gross hematuria or urinary tract infections. No issues with bladder stones.  This is unchanged from previous visit.  He does have baseline CKD stage III, which is stable 1.38, stable from 1.4 year ago.  He believes that his PSAs down because he stopped taking his other medicines except for lisinopril.  PMH: Past Medical History:  Diagnosis Date  . GERD (gastroesophageal reflux disease)   . Hypertension     Surgical History: Past Surgical History:  Procedure Laterality Date  . COLONOSCOPY  2010   cleared for 10 yrs  . INNER EAR SURGERY     had a perforated eardrum  . TONSILLECTOMY      Home Medications:  Allergies as of 02/10/2017      Reactions   Penicillins       Medication List       Accurate as of 02/10/17  1:55 PM. Always use your most recent med list.          lisinopril 10 MG tablet Commonly known as:  PRINIVIL,ZESTRIL Take 1 tablet (10 mg total) by mouth daily.   ranitidine 300 MG tablet Commonly known as:  ZANTAC TAKE 1 TABLET (300 MG TOTAL) BY MOUTH EVERY EVENING.       Allergies:  Allergies  Allergen Reactions  . Penicillins     Family  History: Family History  Problem Relation Age of Onset  . Cancer Mother   . Cancer Father   . Prostate cancer Neg Hx   . Bladder Cancer Neg Hx   . Kidney cancer Neg Hx     Social History:  reports that he has quit smoking. He has never used smokeless tobacco. He reports that he does not drink alcohol or use drugs.  ROS: UROLOGY Frequent Urination?: No Hard to postpone urination?: No Burning/pain with urination?: No Get up at night to urinate?: No Leakage of urine?: No Urine stream starts and stops?: No Trouble starting stream?: No Do you have to strain to urinate?: No Blood in urine?: No Urinary tract infection?: No Sexually transmitted disease?: No Injury to kidneys or bladder?: No Painful intercourse?: No Weak stream?: No Erection problems?: No Penile pain?: No  Gastrointestinal Nausea?: No Vomiting?: No Indigestion/heartburn?: No Diarrhea?: No Constipation?: No  Constitutional Fever: No Night sweats?: No Weight loss?: No Fatigue?: No  Skin Skin rash/lesions?: No Itching?: No  Eyes Blurred vision?: No Double vision?: No  Ears/Nose/Throat Sore throat?: No Sinus problems?: No  Hematologic/Lymphatic Swollen glands?: No Easy bruising?: No  Cardiovascular Leg swelling?: No Chest pain?: No  Respiratory Cough?: No Shortness of breath?: No  Endocrine Excessive thirst?: No  Musculoskeletal Back pain?: No Joint pain?:  No  Neurological Headaches?: No Dizziness?: No  Psychologic Depression?: No Anxiety?: No  Physical Exam: BP 140/77   Pulse 80   Ht 6\' 1"  (1.854 m)   Wt 175 lb (79.4 kg)   BMI 23.09 kg/m   Constitutional:  Alert and oriented, No acute distress. HEENT: [place] AT, moist mucus membranes.  Trachea midline, no masses. Cardiovascular: No clubbing, cyanosis, or edema. Respiratory: Normal respiratory effort, no increased work of breathing. GU: No CVA tenderness.  Skin: No rashes, bruises or suspicious lesions. Neurologic:  Grossly intact, no focal deficits, moving all 4 extremities. Psychiatric: Normal mood and affect.  Laboratory Data:  Lab Results  Component Value Date   CREATININE 1.38 (H) 06/28/2016   Component     Latest Ref Rng & Units 06/28/2016  PSA     0.0 - 4.0 ng/mL 5.1 (H)   Component     Latest Ref Rng & Units 02/09/2017  Prostatic Specific Antigen     0.00 - 4.00 ng/mL 4.78 (H)    Urinalysis    Component Value Date/Time   BILIRUBINUR neg 11/20/2015 1511   PROTEINUR neg 11/20/2015 1511   UROBILINOGEN 0.2 11/20/2015 1511   NITRITE neg 11/20/2015 1511   LEUKOCYTESUR Negative 11/20/2015 1511    Pertinent Imaging: n/a  Assessment & Plan:    1. Elevated PSA Elevated PSA to 5.1, repeat 4.78 -6 months later Prostate enlargement rectal exam Based on his gland size, suspect this is normal PSA density Will recheck in 1 year He is agreeable witht this plan  2. Benign prostatic hyperplasia without lower urinary tract symptoms Enlarged gland on exam today, otherwise asymptomatic No intervention recommended given lack of symptoms  3. Nocturia Voids 2-3 times nightly, likely mostly behavioral Minimal bother  Return in about 1 year (around 02/10/2018) for PSA / DRE.  Vanna Scotland, MD  [facility] [phone]

## 2017-07-04 ENCOUNTER — Other Ambulatory Visit: Payer: Self-pay | Admitting: Family Medicine

## 2017-07-04 DIAGNOSIS — I10 Essential (primary) hypertension: Secondary | ICD-10-CM

## 2017-07-17 ENCOUNTER — Other Ambulatory Visit: Payer: Self-pay | Admitting: Family Medicine

## 2017-07-17 DIAGNOSIS — I10 Essential (primary) hypertension: Secondary | ICD-10-CM

## 2017-07-19 ENCOUNTER — Ambulatory Visit (INDEPENDENT_AMBULATORY_CARE_PROVIDER_SITE_OTHER): Payer: Medicare Other | Admitting: Family Medicine

## 2017-07-19 ENCOUNTER — Other Ambulatory Visit: Payer: Self-pay

## 2017-07-19 ENCOUNTER — Encounter: Payer: Self-pay | Admitting: Family Medicine

## 2017-07-19 VITALS — BP 122/70 | HR 72 | Ht 73.0 in | Wt 174.0 lb

## 2017-07-19 DIAGNOSIS — I10 Essential (primary) hypertension: Secondary | ICD-10-CM | POA: Diagnosis not present

## 2017-07-19 MED ORDER — LISINOPRIL 10 MG PO TABS: ORAL_TABLET | ORAL | 1 refills | Status: DC

## 2017-07-19 NOTE — Progress Notes (Signed)
Name: Jonathan Salinas   MRN: [id number]    DOB: 1936/01/08   Date:07/19/2017       Progress Note  Subjective  Chief Complaint  Chief Complaint  Patient presents with  . Hypertension  . Gastroesophageal Reflux    Hypertension  This is a chronic problem. The current episode started more than 1 year ago. The problem has been waxing and waning since onset. The problem is controlled. Pertinent negatives include no anxiety, blurred vision, chest pain, headaches, malaise/fatigue, neck pain, orthopnea, palpitations, peripheral edema, PND, shortness of breath or sweats. There are no associated agents to hypertension. There are no known risk factors for coronary artery disease. Past treatments include ACE inhibitors. The current treatment provides moderate improvement. There are no compliance problems.  There is no history of angina, kidney disease, CAD/MI, CVA, heart failure, left ventricular hypertrophy, PVD or retinopathy. There is no history of chronic renal disease, a hypertension causing med or renovascular disease.  Gastroesophageal Reflux  He reports no abdominal pain, no belching, no chest pain, no choking, no coughing, no dysphagia, no early satiety, no globus sensation, no heartburn, no hoarse voice, no nausea, no sore throat, no stridor, no tooth decay or no wheezing. This is a chronic problem. The problem has been gradually improving. The symptoms are aggravated by certain foods. Pertinent negatives include no melena or weight loss.    No problem-specific Assessment & Plan notes found for this encounter.   Past Medical History:  Diagnosis Date  . GERD (gastroesophageal reflux disease)   . Hypertension     Past Surgical History:  Procedure Laterality Date  . COLONOSCOPY  2010   cleared for 10 yrs  . INNER EAR SURGERY     had a perforated eardrum  . TONSILLECTOMY      Family History  Problem Relation Age of Onset  . Cancer Mother   . Cancer Father   . Prostate cancer Neg Hx    . Bladder Cancer Neg Hx   . Kidney cancer Neg Hx     Social History   Socioeconomic History  . Marital status: Divorced    Spouse name: Not on file  . Number of children: Not on file  . Years of education: Not on file  . Highest education level: Not on file  Social Needs  . Financial resource strain: Not on file  . Food insecurity - worry: Not on file  . Food insecurity - inability: Not on file  . Transportation needs - medical: Not on file  . Transportation needs - non-medical: Not on file  Occupational History  . Not on file  Tobacco Use  . Smoking status: Former Games developermoker  . Smokeless tobacco: Never Used  Substance and Sexual Activity  . Alcohol use: No    Alcohol/week: 0.0 oz  . Drug use: No  . Sexual activity: Yes  Other Topics Concern  . Not on file  Social History Narrative  . Not on file    Allergies  Allergen Reactions  . Penicillins     Outpatient Medications Prior to Visit  Medication Sig Dispense Refill  . ranitidine (ZANTAC) 300 MG tablet TAKE 1 TABLET (300 MG TOTAL) BY MOUTH EVERY EVENING. 90 tablet 1  . lisinopril (PRINIVIL,ZESTRIL) 10 MG tablet TAKE 1 TABLET (10 MG TOTAL) BY MOUTH DAILY.NEEDS APPT 15 tablet 0   No facility-administered medications prior to visit.     Review of Systems  Constitutional: Negative for chills, fever, malaise/fatigue and weight loss.  HENT:  Negative for ear discharge, ear pain, hoarse voice and sore throat.   Eyes: Negative for blurred vision.  Respiratory: Negative for cough, sputum production, choking, shortness of breath and wheezing.   Cardiovascular: Negative for chest pain, palpitations, orthopnea, leg swelling and PND.  Gastrointestinal: Negative for abdominal pain, blood in stool, constipation, diarrhea, dysphagia, heartburn, melena and nausea.  Genitourinary: Negative for dysuria, frequency, hematuria and urgency.  Musculoskeletal: Negative for back pain, joint pain, myalgias and neck pain.  Skin: Negative for  rash.  Neurological: Negative for dizziness, tingling, sensory change, focal weakness and headaches.  Endo/Heme/Allergies: Negative for environmental allergies and polydipsia. Does not bruise/bleed easily.  Psychiatric/Behavioral: Negative for depression and suicidal ideas. The patient is not nervous/anxious and does not have insomnia.      Objective  Vitals:   07/19/17 0944  BP: 122/70  Pulse: 72  Weight: 174 lb (78.9 kg)  Height: 6\' 1"  (1.854 m)    Physical Exam  Constitutional: He is oriented to person, place, and time and well-developed, well-nourished, and in no distress.  HENT:  Head: Normocephalic.  Right Ear: External ear normal.  Left Ear: External ear normal.  Nose: Nose normal.  Mouth/Throat: Oropharynx is clear and moist.  Eyes: Conjunctivae and EOM are normal. Pupils are equal, round, and reactive to light. Right eye exhibits no discharge. Left eye exhibits no discharge. No scleral icterus.  Neck: Normal range of motion. Neck supple. No JVD present. No tracheal deviation present. No thyromegaly present.  Cardiovascular: Normal rate, regular rhythm, normal heart sounds and intact distal pulses. Exam reveals no gallop and no friction rub.  No murmur heard. Pulmonary/Chest: Breath sounds normal. No respiratory distress. He has no wheezes. He has no rales.  Abdominal: Soft. Bowel sounds are normal. He exhibits no mass. There is no hepatosplenomegaly. There is no tenderness. There is no rebound, no guarding and no CVA tenderness.  Musculoskeletal: Normal range of motion. He exhibits no edema or tenderness.  Lymphadenopathy:    He has no cervical adenopathy.  Neurological: He is alert and oriented to person, place, and time. He has normal sensation, normal strength, normal reflexes and intact cranial nerves. No cranial nerve deficit.  Skin: Skin is warm. No rash noted.  Psychiatric: Mood and affect normal.  Nursing note and vitals reviewed.     Assessment &  Plan  Problem List Items Addressed This Visit    None    Visit Diagnoses    Essential hypertension    -  Primary   Relevant Medications   lisinopril (PRINIVIL,ZESTRIL) 10 MG tablet   Other Relevant Orders   Renal function panel      Meds ordered this encounter  Medications  . lisinopril (PRINIVIL,ZESTRIL) 10 MG tablet    Sig: TAKE 1 TABLET (10 MG TOTAL) BY MOUTH DAILY.NEEDS APPT    Dispense:  90 tablet    Refill:  1    Needs appt      Dr. Elizabeth Sauereanna Jones [facility]  07/19/17

## 2017-07-20 LAB — RENAL FUNCTION PANEL
Albumin: 4.3 g/dL (ref 3.5–4.8)
BUN / CREAT RATIO: 17 (ref 10–24)
BUN: 21 mg/dL (ref 8–27)
CALCIUM: 9.5 mg/dL (ref 8.6–10.2)
CO2: 21 mmol/L (ref 20–29)
CREATININE: 1.27 mg/dL (ref 0.76–1.27)
Chloride: 106 mmol/L (ref 96–106)
GFR, EST AFRICAN AMERICAN: 62 mL/min/{1.73_m2} (ref >59)
GFR, EST NON AFRICAN AMERICAN: 53 mL/min/{1.73_m2} — AB (ref >59)
Glucose: 90 mg/dL (ref 65–99)
Phosphorus: 2.9 mg/dL (ref 2.5–4.5)
Potassium: 4.7 mmol/L (ref 3.5–5.2)
SODIUM: 143 mmol/L (ref 134–144)

## 2017-10-23 ENCOUNTER — Ambulatory Visit: Payer: Self-pay | Admitting: Family Medicine

## 2017-11-06 ENCOUNTER — Encounter: Payer: Self-pay | Admitting: Family Medicine

## 2017-11-06 ENCOUNTER — Ambulatory Visit (INDEPENDENT_AMBULATORY_CARE_PROVIDER_SITE_OTHER): Payer: Medicare Other | Admitting: Family Medicine

## 2017-11-06 VITALS — BP 138/80 | HR 68 | Ht 73.0 in | Wt 171.0 lb

## 2017-11-06 DIAGNOSIS — F329 Major depressive disorder, single episode, unspecified: Secondary | ICD-10-CM

## 2017-11-06 DIAGNOSIS — F430 Acute stress reaction: Secondary | ICD-10-CM

## 2017-11-06 DIAGNOSIS — F411 Generalized anxiety disorder: Secondary | ICD-10-CM

## 2017-11-06 MED ORDER — SERTRALINE HCL 50 MG PO TABS: 50.0000 mg | ORAL_TABLET | Freq: Every day | ORAL | 3 refills | Status: DC

## 2017-11-06 MED ORDER — ALPRAZOLAM 0.25 MG PO TABS: 0.2500 mg | ORAL_TABLET | Freq: Two times a day (BID) | ORAL | 0 refills | Status: DC | PRN

## 2017-11-06 NOTE — Patient Instructions (Signed)
Stress and Stress Management Stress is a normal reaction to life events. It is what you feel when life demands more than you are used to or more than you can handle. Some stress can be useful. For example, the stress reaction can help you catch the last bus of the day, study for a test, or meet a deadline at work. But stress that occurs too often or for too long can cause problems. It can affect your emotional health and interfere with relationships and normal daily activities. Too much stress can weaken your immune system and increase your risk for physical illness. If you already have a medical problem, stress can make it worse. What are the causes? All sorts of life events may cause stress. An event that causes stress for one person may not be stressful for another person. Major life events commonly cause stress. These may be positive or negative. Examples include losing your job, moving into a new home, getting married, having a baby, or losing a loved one. Less obvious life events may also cause stress, especially if they occur day after day or in combination. Examples include working long hours, driving in traffic, caring for children, being in debt, or being in a difficult relationship. What are the signs or symptoms? Stress may cause emotional symptoms including, the following:  Anxiety. This is feeling worried, afraid, on edge, overwhelmed, or out of control.  Anger. This is feeling irritated or impatient.  Depression. This is feeling sad, down, helpless, or guilty.  Difficulty focusing, remembering, or making decisions.  Stress may cause physical symptoms, including the following:  Aches and pains. These may affect your head, neck, back, stomach, or other areas of your body.  Tight muscles or clenched jaw.  Low energy or trouble sleeping.  Stress may cause unhealthy behaviors, including the following:  Eating to feel better (overeating) or skipping meals.  Sleeping too little,  too much, or both.  Working too much or putting off tasks (procrastination).  Smoking, drinking alcohol, or using drugs to feel better.  How is this diagnosed? Stress is diagnosed through an assessment by your health care provider. Your health care provider will ask questions about your symptoms and any stressful life events.Your health care provider will also ask about your medical history and may order blood tests or other tests. Certain medical conditions and medicine can cause physical symptoms similar to stress. Mental illness can cause emotional symptoms and unhealthy behaviors similar to stress. Your health care provider may refer you to a mental health professional for further evaluation. How is this treated? Stress management is the recommended treatment for stress.The goals of stress management are reducing stressful life events and coping with stress in healthy ways. Techniques for reducing stressful life events include the following:  Stress identification. Self-monitor for stress and identify what causes stress for you. These skills may help you to avoid some stressful events.  Time management. Set your priorities, keep a calendar of events, and learn to say "no." These tools can help you avoid making too many commitments.  Techniques for coping with stress include the following:  Rethinking the problem. Try to think realistically about stressful events rather than ignoring them or overreacting. Try to find the positives in a stressful situation rather than focusing on the negatives.  Exercise. Physical exercise can release both physical and emotional tension. The key is to find a form of exercise you enjoy and do it regularly.  Relaxation techniques. These relax the body and  mind. Examples include yoga, meditation, tai chi, biofeedback, deep breathing, progressive muscle relaxation, listening to music, being out in nature, journaling, and other hobbies. Again, the key is to find  one or more that you enjoy and can do regularly.  Healthy lifestyle. Eat a balanced diet, get plenty of sleep, and do not smoke. Avoid using alcohol or drugs to relax.  Strong support network. Spend time with family, friends, or other people you enjoy being around.Express your feelings and talk things over with someone you trust.  Counseling or talktherapy with a mental health professional may be helpful if you are having difficulty managing stress on your own. Medicine is typically not recommended for the treatment of stress.Talk to your health care provider if you think you need medicine for symptoms of stress. Follow these instructions at home:  Keep all follow-up visits as directed by your health care provider.  Take all medicines as directed by your health care provider. Contact a health care provider if:  Your symptoms get worse or you start having new symptoms.  You feel overwhelmed by your problems and can no longer manage them on your own. Get help right away if:  You feel like hurting yourself or someone else. This information is not intended to replace advice given to you by your health care provider. Make sure you discuss any questions you have with your health care provider. Document Released: 01/04/2001 Document Revised: 12/17/2015 Document Reviewed: 03/05/2013 Elsevier Interactive Patient Education  2017 [employer]  

## 2017-11-06 NOTE — Progress Notes (Signed)
Name: Jonathan Salinas   MRN: [id number]    DOB: 1935-12-10   Date:11/06/2017       Progress Note  Subjective  Chief Complaint  Chief Complaint  Patient presents with  . Anxiety    Anxiety  Presents for initial visit. Onset was 1 to 4 weeks ago. The problem has been waxing and waning. Symptoms include excessive worry, nervous/anxious behavior and panic. Patient reports no chest pain, compulsions, confusion, decreased concentration, depressed mood, dizziness, dry mouth, feeling of choking, hyperventilation, impotence, insomnia, irritability, malaise, muscle tension, nausea, obsessions, palpitations, restlessness, shortness of breath or suicidal ideas. The severity of symptoms is mild. Exacerbated by: stress. The quality of sleep is good.   Risk factors include a major life event. His past medical history is significant for anxiety/panic attacks. Past treatments include nothing. Compliance with prior treatments has been good.    No problem-specific Assessment & Plan notes found for this encounter.   Past Medical History:  Diagnosis Date  . GERD (gastroesophageal reflux disease)   . Hypertension     Past Surgical History:  Procedure Laterality Date  . COLONOSCOPY  2010   cleared for 10 yrs  . INNER EAR SURGERY     had a perforated eardrum  . TONSILLECTOMY      Family History  Problem Relation Age of Onset  . Cancer Mother   . Cancer Father   . Prostate cancer Neg Hx   . Bladder Cancer Neg Hx   . Kidney cancer Neg Hx     Social History   Socioeconomic History  . Marital status: Divorced    Spouse name: Not on file  . Number of children: Not on file  . Years of education: Not on file  . Highest education level: Not on file  Occupational History  . Not on file  Social Needs  . Financial resource strain: Not on file  . Food insecurity:    Worry: Not on file    Inability: Not on file  . Transportation needs:    Medical: Not on file    Non-medical: Not on file   Tobacco Use  . Smoking status: Former Games developer  . Smokeless tobacco: Never Used  Substance and Sexual Activity  . Alcohol use: No    Alcohol/week: 0.0 oz  . Drug use: No  . Sexual activity: Yes  Lifestyle  . Physical activity:    Days per week: Not on file    Minutes per session: Not on file  . Stress: Not on file  Relationships  . Social connections:    Talks on phone: Not on file    Gets together: Not on file    Attends religious service: Not on file    Active member of club or organization: Not on file    Attends meetings of clubs or organizations: Not on file    Relationship status: Not on file  . Intimate partner violence:    Fear of current or ex partner: Not on file    Emotionally abused: Not on file    Physically abused: Not on file    Forced sexual activity: Not on file  Other Topics Concern  . Not on file  Social History Narrative  . Not on file    Allergies  Allergen Reactions  . Penicillins     Outpatient Medications Prior to Visit  Medication Sig Dispense Refill  . lisinopril (PRINIVIL,ZESTRIL) 10 MG tablet TAKE 1 TABLET (10 MG TOTAL) BY MOUTH DAILY.NEEDS APPT 90  tablet 1  . ranitidine (ZANTAC) 300 MG tablet TAKE 1 TABLET (300 MG TOTAL) BY MOUTH EVERY EVENING. 90 tablet 1   No facility-administered medications prior to visit.     Review of Systems  Constitutional: Negative for chills, fever, irritability, malaise/fatigue and weight loss.  HENT: Negative for ear discharge, ear pain and sore throat.   Eyes: Negative for blurred vision.  Respiratory: Negative for cough, sputum production, shortness of breath and wheezing.   Cardiovascular: Negative for chest pain, palpitations and leg swelling.  Gastrointestinal: Negative for abdominal pain, blood in stool, constipation, diarrhea, heartburn, melena and nausea.  Genitourinary: Negative for dysuria, frequency, hematuria, impotence and urgency.  Musculoskeletal: Negative for back pain, joint pain, myalgias  and neck pain.  Skin: Negative for rash.  Neurological: Negative for dizziness, tingling, sensory change, focal weakness and headaches.  Endo/Heme/Allergies: Negative for environmental allergies and polydipsia. Does not bruise/bleed easily.  Psychiatric/Behavioral: Negative for confusion, decreased concentration, depression and suicidal ideas. The patient is nervous/anxious. The patient does not have insomnia.      Objective  Vitals:   11/06/17 0854  BP: 138/80  Pulse: 68  Weight: 171 lb (77.6 kg)  Height: 6\' 1"  (1.854 m)    Physical Exam  Constitutional: He is oriented to person, place, and time.  HENT:  Head: Normocephalic.  Right Ear: External ear normal.  Left Ear: External ear normal.  Nose: Nose normal.  Mouth/Throat: Oropharynx is clear and moist.  Eyes: Pupils are equal, round, and reactive to light. Conjunctivae and EOM are normal. Right eye exhibits no discharge. Left eye exhibits no discharge. No scleral icterus.  Neck: Normal range of motion. Neck supple. No JVD present. No tracheal deviation present. No thyromegaly present.  Cardiovascular: Normal rate, regular rhythm, normal heart sounds and intact distal pulses. Exam reveals no gallop and no friction rub.  No murmur heard. Pulmonary/Chest: Breath sounds normal. No respiratory distress. He has no wheezes. He has no rales.  Abdominal: Soft. Bowel sounds are normal. He exhibits no mass. There is no hepatosplenomegaly. There is no tenderness. There is no rebound, no guarding and no CVA tenderness.  Musculoskeletal: Normal range of motion. He exhibits no edema or tenderness.  Lymphadenopathy:    He has no cervical adenopathy.  Neurological: He is alert and oriented to person, place, and time. He has normal strength and normal reflexes. No cranial nerve deficit.  Skin: Skin is warm. No rash noted.  Nursing note and vitals reviewed.     Assessment & Plan  Problem List Items Addressed This Visit    None    Visit  Diagnoses    Reactive depression    -  Primary   Relevant Medications   sertraline (ZOLOFT) 50 MG tablet   ALPRAZolam (XANAX) 0.25 MG tablet   Anxiety as acute reaction to exceptional stress       Relevant Medications   sertraline (ZOLOFT) 50 MG tablet   ALPRAZolam (XANAX) 0.25 MG tablet      Meds ordered this encounter  Medications  . sertraline (ZOLOFT) 50 MG tablet    Sig: Take 1 tablet (50 mg total) by mouth daily. Start at one half tablet for 10 days    Dispense:  30 tablet    Refill:  3  . ALPRAZolam (XANAX) 0.25 MG tablet    Sig: Take 1 tablet (0.25 mg total) by mouth 2 (two) times daily as needed for anxiety. Mat take one half    Dispense:  20 tablet  Refill:  0      Dr. [facility]  11/06/17

## 2017-12-03 ENCOUNTER — Other Ambulatory Visit: Payer: Self-pay | Admitting: Family Medicine

## 2017-12-03 DIAGNOSIS — I10 Essential (primary) hypertension: Secondary | ICD-10-CM

## 2018-02-09 ENCOUNTER — Ambulatory Visit: Payer: Self-pay | Admitting: Urology

## 2018-03-06 ENCOUNTER — Other Ambulatory Visit: Payer: Self-pay | Admitting: Family Medicine

## 2018-03-06 DIAGNOSIS — I10 Essential (primary) hypertension: Secondary | ICD-10-CM

## 2018-04-30 ENCOUNTER — Encounter: Payer: Self-pay | Admitting: Family Medicine

## 2018-04-30 ENCOUNTER — Ambulatory Visit (INDEPENDENT_AMBULATORY_CARE_PROVIDER_SITE_OTHER): Payer: Medicare Other | Admitting: Family Medicine

## 2018-04-30 VITALS — BP 134/100 | HR 68 | Ht 73.0 in | Wt 172.0 lb

## 2018-04-30 DIAGNOSIS — R1031 Right lower quadrant pain: Secondary | ICD-10-CM | POA: Diagnosis not present

## 2018-04-30 DIAGNOSIS — Z1211 Encounter for screening for malignant neoplasm of colon: Secondary | ICD-10-CM

## 2018-04-30 LAB — HEMOCCULT GUIAC POC 1CARD (OFFICE): Fecal Occult Blood, POC: NEGATIVE

## 2018-04-30 MED ORDER — MELOXICAM 15 MG PO TABS
15.0000 mg | ORAL_TABLET | Freq: Every day | ORAL | 0 refills | Status: DC
Start: 2018-04-30 — End: 2018-05-23

## 2018-04-30 NOTE — Progress Notes (Signed)
Date:  04/30/2018   Name:  Jonathan Salinas   DOB:  [birth year]   MRN:  [id number]   Chief Complaint: Groin Pain (R) groin pain that makes him nauseated. Sometimes is on the L). No problems going to the BR) Groin Pain  The patient's pertinent negatives include no genital injury, genital itching, genital lesions, pelvic pain, scrotal swelling or testicular pain. This is a new problem. The current episode started 1 to 4 weeks ago (2-3 weeks ago). The problem occurs intermittently. The pain is mild. Associated symptoms include nausea. Pertinent negatives include no abdominal pain, chest pain, chills, constipation, coughing, diarrhea, dysuria, fever, flank pain, frequency, headaches, hematuria, hesitancy, joint pain, rash, shortness of breath, sore throat or urgency. Nothing ("I cannot say") aggravates the symptoms. The treatment provided no relief. His past medical history is significant for an inguinal hernia. There is no history of BPH. (Repaired bilateral 2010-2012)    Review of Systems  Constitutional: Negative for chills and fever.  HENT: Negative for drooling, ear discharge, ear pain and sore throat.   Respiratory: Negative for cough, shortness of breath and wheezing.   Cardiovascular: Negative for chest pain, palpitations and leg swelling.  Gastrointestinal: Positive for nausea. Negative for abdominal pain, blood in stool, constipation and diarrhea.  Endocrine: Negative for polydipsia.  Genitourinary: Negative for dysuria, flank pain, frequency, hematuria, hesitancy, pelvic pain, scrotal swelling, testicular pain and urgency.  Musculoskeletal: Negative for back pain, joint pain, myalgias and neck pain.  Skin: Negative for rash.  Allergic/Immunologic: Negative for environmental allergies.  Neurological: Negative for dizziness and headaches.  Hematological: Does not bruise/bleed easily.  Psychiatric/Behavioral: Negative for suicidal ideas. The patient is not nervous/anxious.     There  are no active problems to display for this patient.   Allergies  Allergen Reactions  . Penicillins     Past Surgical History:  Procedure Laterality Date  . COLONOSCOPY  2010   cleared for 10 yrs  . INNER EAR SURGERY     had a perforated eardrum  . TONSILLECTOMY      Social History   Tobacco Use  . Smoking status: Former Games developer  . Smokeless tobacco: Never Used  Substance Use Topics  . Alcohol use: No    Alcohol/week: 0.0 standard drinks  . Drug use: No     Medication list has been reviewed and updated.  Current Meds  Medication Sig  . lisinopril (PRINIVIL,ZESTRIL) 10 MG tablet TAKE 1 TABLET (10 MG TOTAL) BY MOUTH DAILY.NEEDS APPT  . [DISCONTINUED] ALPRAZolam (XANAX) 0.25 MG tablet Take 1 tablet (0.25 mg total) by mouth 2 (two) times daily as needed for anxiety. Mat take one half    PHQ 2/9 Scores 04/30/2018 07/19/2017 08/25/2015 05/18/2015  PHQ - 2 Score 0 0 0 1  PHQ- 9 Score 1 0 - -    Physical Exam  Constitutional: He is oriented to person, place, and time.  HENT:  Head: Normocephalic.  Right Ear: External ear normal.  Left Ear: External ear normal.  Nose: Nose normal.  Mouth/Throat: Oropharynx is clear and moist.  Eyes: Pupils are equal, round, and reactive to light. Conjunctivae and EOM are normal. Right eye exhibits no discharge. Left eye exhibits no discharge. No scleral icterus.  Neck: Normal range of motion. Neck supple. No JVD present. No tracheal deviation present. No thyromegaly present.  Cardiovascular: Normal rate, regular rhythm, normal heart sounds and intact distal pulses. Exam reveals no gallop and no friction rub.  No murmur heard. Pulmonary/Chest:  Breath sounds normal. No respiratory distress. He has no wheezes. He has no rales.  Abdominal: Soft. Bowel sounds are normal. He exhibits no mass. There is no hepatosplenomegaly. There is no tenderness. There is no rebound, no guarding and no CVA tenderness. Hernia confirmed negative in the right  inguinal area and confirmed negative in the left inguinal area.  Genitourinary: Testes normal and penis normal. Right testis shows no mass, no swelling and no tenderness. Left testis shows no mass, no swelling and no tenderness.  Musculoskeletal: Normal range of motion. He exhibits no edema.       Right hip: He exhibits tenderness and deformity. He exhibits normal range of motion, normal strength, no bony tenderness and no swelling.       Legs: Lymphadenopathy:    He has no cervical adenopathy. No inguinal adenopathy noted on the right or left side.  Neurological: He is alert and oriented to person, place, and time. He has normal strength and normal reflexes. No cranial nerve deficit.  Skin: Skin is warm. No rash noted.  Nursing note and vitals reviewed.   BP (!) 134/100   Pulse 68   Ht 6\' 1"  (1.854 m)   Wt 172 lb (78 kg)   BMI 22.69 kg/m   Assessment and Plan:  1. Inguinal pain, right Previous inguinal hernia repair FROM of hip. Tenderness of right right ileo psoas area. Trial meloxicam 15 mg. - meloxicam (MOBIC) 15 MG tablet; Take 1 tablet (15 mg total) by mouth daily.  Dispense: 30 tablet; Refill: 0.   Dr. [facility]  04/30/2018

## 2018-04-30 NOTE — Addendum Note (Signed)
Addended by: Everitt Amber on: 04/30/2018 02:15 PM   Modules accepted: Orders

## 2018-05-23 ENCOUNTER — Other Ambulatory Visit: Payer: Self-pay | Admitting: Family Medicine

## 2018-05-23 DIAGNOSIS — R1031 Right lower quadrant pain: Secondary | ICD-10-CM

## 2018-05-25 ENCOUNTER — Other Ambulatory Visit: Payer: Self-pay | Admitting: Family Medicine

## 2018-05-25 DIAGNOSIS — I10 Essential (primary) hypertension: Secondary | ICD-10-CM

## 2018-06-19 ENCOUNTER — Ambulatory Visit (INDEPENDENT_AMBULATORY_CARE_PROVIDER_SITE_OTHER): Payer: Medicare Other | Admitting: Family Medicine

## 2018-06-19 ENCOUNTER — Encounter: Payer: Self-pay | Admitting: Family Medicine

## 2018-06-19 VITALS — BP 150/80 | HR 88 | Ht 73.0 in | Wt 176.0 lb

## 2018-06-19 DIAGNOSIS — R079 Chest pain, unspecified: Secondary | ICD-10-CM | POA: Diagnosis not present

## 2018-06-19 DIAGNOSIS — R0789 Other chest pain: Secondary | ICD-10-CM | POA: Diagnosis not present

## 2018-06-19 DIAGNOSIS — I4510 Unspecified right bundle-branch block: Secondary | ICD-10-CM

## 2018-06-19 NOTE — Progress Notes (Signed)
Date:  06/19/2018   Name:  Jonathan Salinas   DOB:  March 09, 1936   MRN:  [id number]   Chief Complaint: Shoulder Pain (hurting across shoulders and upper chest after carrying a back pack leaf blower around blowing leaves. Did experience some SOB, but that has resolved.) Chest Pain   This is a new problem. The current episode started in the past 7 days (Sunday). The onset quality is sudden. Episode frequency: one episodeduration 6-7 hrs. The problem has been unchanged. The pain is present in the substernal region (across chest). The pain is at a severity of 6/10. The pain is mild. Quality: soreness. The pain does not radiate. Associated symptoms include leg pain and shortness of breath. Pertinent negatives include no abdominal pain, back pain, claudication, cough, diaphoresis, dizziness, exertional chest pressure, fever, headaches, hemoptysis, irregular heartbeat, lower extremity edema, malaise/fatigue, nausea, near-syncope, numbness, orthopnea, palpitations, PND, sputum production, syncope, vomiting or weakness. Associated symptoms comments: Pain in calves. He has tried nothing for the symptoms.     Review of Systems  Constitutional: Negative for chills, diaphoresis, fever and malaise/fatigue.  HENT: Negative for drooling, ear discharge, ear pain and sore throat.   Respiratory: Positive for shortness of breath. Negative for cough, hemoptysis, sputum production and wheezing.   Cardiovascular: Positive for chest pain. Negative for palpitations, orthopnea, claudication, leg swelling, syncope, PND and near-syncope.  Gastrointestinal: Negative for abdominal pain, blood in stool, constipation, diarrhea, nausea and vomiting.  Endocrine: Negative for polydipsia.  Genitourinary: Negative for dysuria, frequency, hematuria and urgency.  Musculoskeletal: Negative for back pain, myalgias and neck pain.  Skin: Negative for rash.  Allergic/Immunologic: Negative for environmental allergies.  Neurological:  Negative for dizziness, weakness, numbness and headaches.  Hematological: Does not bruise/bleed easily.  Psychiatric/Behavioral: Negative for suicidal ideas. The patient is not nervous/anxious.     There are no active problems to display for this patient.   Allergies  Allergen Reactions  . Penicillins     Past Surgical History:  Procedure Laterality Date  . COLONOSCOPY  2010   cleared for 10 yrs  . INNER EAR SURGERY     had a perforated eardrum  . TONSILLECTOMY      Social History   Tobacco Use  . Smoking status: Former Games developermoker  . Smokeless tobacco: Never Used  Substance Use Topics  . Alcohol use: No    Alcohol/week: 0.0 standard drinks  . Drug use: No     Medication list has been reviewed and updated.  Current Meds  Medication Sig  . lisinopril (PRINIVIL,ZESTRIL) 10 MG tablet TAKE 1 TABLET (10 MG TOTAL) BY MOUTH DAILY.NEEDS APPT    PHQ 2/9 Scores 06/19/2018 04/30/2018 07/19/2017 08/25/2015  PHQ - 2 Score 0 0 0 0  PHQ- 9 Score 0 1 0 -    Physical Exam  Constitutional: He is oriented to person, place, and time.  HENT:  Head: Normocephalic.  Right Ear: External ear normal.  Left Ear: External ear normal.  Nose: Nose normal.  Mouth/Throat: Oropharynx is clear and moist.  Eyes: Pupils are equal, round, and reactive to light. Conjunctivae and EOM are normal. Right eye exhibits no discharge. Left eye exhibits no discharge. No scleral icterus.  Neck: Normal range of motion. Neck supple. No JVD present. No tracheal deviation present. No thyromegaly present.  Cardiovascular: Normal rate, regular rhythm, normal heart sounds and intact distal pulses. Exam reveals no gallop and no friction rub.  No murmur heard. Pulmonary/Chest: Breath sounds normal. No respiratory distress. He  has no wheezes. He has no rales.  Abdominal: Soft. Bowel sounds are normal. He exhibits no mass. There is no hepatosplenomegaly. There is no tenderness. There is no rebound, no guarding and no CVA  tenderness.  Musculoskeletal: Normal range of motion. He exhibits no edema or tenderness.  Lymphadenopathy:    He has no cervical adenopathy.  Neurological: He is alert and oriented to person, place, and time. He has normal strength and normal reflexes. No cranial nerve deficit.  Skin: Skin is warm. No rash noted.  Nursing note and vitals reviewed.   BP (!) 150/80   Pulse 88   Ht 6\' 1"  (1.854 m)   Wt 176 lb (79.8 kg)   BMI 23.22 kg/m   Assessment and Plan:  1. Chest pain, unspecified type EKG discussed/ refer to cardiology for further eval - EKG 12-Lead - Ambulatory referral to Cardiology  2. Atypical chest pain Referral placed for cardiology - Ambulatory referral to Cardiology  3. New onset right bundle branch block (RBBB) Referral placed for cardiology/ discussed EKG - Ambulatory referral to Cardiology   Dr. Elizabeth Sauer [facility]  06/19/2018

## 2018-06-24 ENCOUNTER — Other Ambulatory Visit: Payer: Self-pay | Admitting: Family Medicine

## 2018-06-24 DIAGNOSIS — I10 Essential (primary) hypertension: Secondary | ICD-10-CM

## 2018-06-25 DIAGNOSIS — I10 Essential (primary) hypertension: Secondary | ICD-10-CM | POA: Insufficient documentation

## 2018-07-25 ENCOUNTER — Other Ambulatory Visit: Payer: Self-pay | Admitting: Family Medicine

## 2018-07-25 DIAGNOSIS — I10 Essential (primary) hypertension: Secondary | ICD-10-CM

## 2018-08-19 ENCOUNTER — Other Ambulatory Visit: Payer: Self-pay | Admitting: Family Medicine

## 2018-08-19 DIAGNOSIS — I10 Essential (primary) hypertension: Secondary | ICD-10-CM

## 2018-09-27 ENCOUNTER — Telehealth: Payer: Self-pay | Admitting: Family Medicine

## 2018-09-27 NOTE — Telephone Encounter (Signed)
Called to schedule Medicare Annual Wellness Visit with the Nurse Health Advisor.  Patient states he DOES NOT want to have this visit.   Thank you! For any questions please contact: Trixie Rude at [phone] or Skype [email or web address]

## 2018-11-18 ENCOUNTER — Other Ambulatory Visit: Payer: Self-pay | Admitting: Family Medicine

## 2018-11-18 DIAGNOSIS — I10 Essential (primary) hypertension: Secondary | ICD-10-CM

## 2018-12-14 ENCOUNTER — Other Ambulatory Visit: Payer: Self-pay | Admitting: Family Medicine

## 2018-12-14 DIAGNOSIS — I10 Essential (primary) hypertension: Secondary | ICD-10-CM

## 2019-07-29 ENCOUNTER — Other Ambulatory Visit: Payer: Self-pay

## 2019-07-29 ENCOUNTER — Encounter: Payer: Self-pay | Admitting: Family Medicine

## 2019-07-29 ENCOUNTER — Ambulatory Visit (INDEPENDENT_AMBULATORY_CARE_PROVIDER_SITE_OTHER): Payer: Medicare Other | Admitting: Family Medicine

## 2019-07-29 VITALS — BP 140/88 | HR 88 | Resp 12 | Ht 73.0 in | Wt 174.0 lb

## 2019-07-29 DIAGNOSIS — I10 Essential (primary) hypertension: Secondary | ICD-10-CM

## 2019-07-29 DIAGNOSIS — K219 Gastro-esophageal reflux disease without esophagitis: Secondary | ICD-10-CM

## 2019-07-29 DIAGNOSIS — F419 Anxiety disorder, unspecified: Secondary | ICD-10-CM | POA: Diagnosis not present

## 2019-07-29 MED ORDER — LISINOPRIL 10 MG PO TABS: ORAL_TABLET | ORAL | 1 refills | Status: DC

## 2019-07-29 NOTE — Progress Notes (Signed)
Date:  07/29/2019   Name:  Jonathan Salinas   DOB:  [birth year]   MRN:  [id number]   Chief Complaint: Hypertension and Gastroesophageal Reflux  Hypertension This is a chronic problem. The current episode started more than 1 year ago. The problem has been gradually improving since onset. The problem is controlled (attributes to anxiety). Pertinent negatives include no anxiety, blurred vision, chest pain, headaches, malaise/fatigue, neck pain, orthopnea, palpitations, peripheral edema, PND, shortness of breath or sweats. There are no associated agents to hypertension. Risk factors for coronary artery disease include dyslipidemia. Past treatments include ACE inhibitors. The current treatment provides mild improvement. There are no compliance problems.  There is no history of angina, kidney disease, CAD/MI, CVA, heart failure, left ventricular hypertrophy, PVD or retinopathy. There is no history of chronic renal disease, a hypertension causing med or renovascular disease.  Gastroesophageal Reflux He reports no abdominal pain, no belching, no chest pain, no choking, no coughing, no dysphagia, no early satiety, no globus sensation, no heartburn, no hoarse voice, no nausea, no sore throat, no stridor, no tooth decay, no water brash or no wheezing. This is a new problem. The current episode started more than 1 year ago. The problem has been gradually improving. The heartburn is of mild intensity. Nothing aggravates the symptoms. Pertinent negatives include no anemia, fatigue, melena, muscle weakness, orthopnea or weight loss. There are no known risk factors.    Lab Results  Component Value Date   CREATININE 1.27 07/19/2017   BUN 21 07/19/2017   NA 143 07/19/2017   K 4.7 07/19/2017   CL 106 07/19/2017   CO2 21 07/19/2017   No results found for: CHOL, HDL, LDLCALC, LDLDIRECT, TRIG, CHOLHDL No results found for: TSH No results found for: HGBA1C   Review of Systems  Constitutional: Negative for  chills, fatigue, fever, malaise/fatigue and weight loss.  HENT: Negative for drooling, ear discharge, ear pain, hoarse voice, rhinorrhea and sore throat.   Eyes: Negative for blurred vision.  Respiratory: Negative for cough, choking, chest tightness, shortness of breath and wheezing.   Cardiovascular: Negative for chest pain, palpitations, orthopnea, leg swelling and PND.  Gastrointestinal: Negative for abdominal pain, blood in stool, constipation, diarrhea, dysphagia, heartburn, melena and nausea.  Endocrine: Negative for polydipsia.  Genitourinary: Negative for dysuria, frequency, hematuria and urgency.  Musculoskeletal: Negative for back pain, myalgias, muscle weakness and neck pain.  Skin: Negative for rash.  Allergic/Immunologic: Negative for environmental allergies.  Neurological: Positive for tremors. Negative for dizziness and headaches.  Hematological: Does not bruise/bleed easily.  Psychiatric/Behavioral: Negative for suicidal ideas. The patient is not nervous/anxious.     There are no problems to display for this patient.   Allergies  Allergen Reactions  . Penicillins     Past Surgical History:  Procedure Laterality Date  . COLONOSCOPY  2010   cleared for 10 yrs  . INNER EAR SURGERY     had a perforated eardrum  . TONSILLECTOMY      Social History   Tobacco Use  . Smoking status: Former Games developer  . Smokeless tobacco: Never Used  Substance Use Topics  . Alcohol use: No    Alcohol/week: 0.0 standard drinks  . Drug use: No     Medication list has been reviewed and updated.  Current Meds  Medication Sig  . lisinopril (ZESTRIL) 10 MG tablet TAKE 1 TABLET (10 MG TOTAL) BY MOUTH DAILY.NEEDS APPT    PHQ 2/9 Scores 07/29/2019 06/19/2018 04/30/2018 07/19/2017  PHQ -  2 Score 1 0 0 0  PHQ- 9 Score 1 0 1 0    BP Readings from Last 3 Encounters:  07/29/19 140/90  06/19/18 (!) 150/80  04/30/18 (!) 134/100    Physical Exam Vitals and nursing note reviewed.    HENT:     Head: Normocephalic.     Right Ear: Tympanic membrane, ear canal and external ear normal.     Left Ear: Tympanic membrane, ear canal and external ear normal.     Nose: Nose normal.  Eyes:     General: No scleral icterus.       Right eye: No discharge.        Left eye: No discharge.     Conjunctiva/sclera: Conjunctivae normal.     Pupils: Pupils are equal, round, and reactive to light.  Neck:     Thyroid: No thyromegaly.     Vascular: No JVD.     Trachea: No tracheal deviation.  Cardiovascular:     Rate and Rhythm: Normal rate and regular rhythm.     Heart sounds: Normal heart sounds, S1 normal and S2 normal. No murmur. No systolic murmur. No diastolic murmur. No friction rub. No gallop. No S3 or S4 sounds.   Pulmonary:     Effort: No respiratory distress.     Breath sounds: Normal breath sounds. No wheezing or rales.  Abdominal:     General: Bowel sounds are normal.     Palpations: Abdomen is soft. There is no mass.     Tenderness: There is no abdominal tenderness. There is no guarding or rebound.  Musculoskeletal:        General: No tenderness. Normal range of motion.     Cervical back: Normal range of motion and neck supple.  Lymphadenopathy:     Cervical: No cervical adenopathy.  Skin:    General: Skin is warm.     Findings: No rash.  Neurological:     Mental Status: He is alert and oriented to person, place, and time.     Cranial Nerves: No cranial nerve deficit.     Deep Tendon Reflexes: Reflexes are normal and symmetric.     Wt Readings from Last 3 Encounters:  07/29/19 174 lb (78.9 kg)  06/19/18 176 lb (79.8 kg)  04/30/18 172 lb (78 kg)    BP 140/90   Pulse 88   Ht 6\' 1"  (1.854 m)   Wt 174 lb (78.9 kg)   BMI 22.96 kg/m   Assessment and Plan: 1. Essential hypertension Chronic.  Controlled.  Blood pressure is elevated with anxiety.  Upon recheck it has come down slightly.  We will continue lisinopril 10 mg once a day and will check renal  function panel along with lipid panel.  Patient's previous labs were reviewed and we will repeat a recent renal function panel along with lipid panel.  We will recheck patient in 6 months. - lisinopril (ZESTRIL) 10 MG tablet; One a day  Dispense: 90 tablet; Refill: 1 - Renal Function Panel - Lipid Panel With LDL/HDL Ratio  2. Gastroesophageal reflux disease, unspecified whether esophagitis present Chronic.  Controlled.  Stable.  Resolved.  Patient no longer is having any reflux issues and is not needing the assistance of the PPI nor H2 blocker.  Patient will control what he eats and this is been what he is found to do the best for his situation.  3. Chronic anxiety Patient does have issues with chronic anxiety which is episodic.  Controlled within reason.  Stable.  Patient does not require any medications for control of anxiety and quite frankly at this point time seems to be very happy with situation and is in a very calm mood for his usual disposition.

## 2019-07-29 NOTE — Patient Instructions (Signed)

## 2019-07-30 LAB — RENAL FUNCTION PANEL
Albumin: 4.2 g/dL (ref 3.6–4.6)
BUN/Creatinine Ratio: 13 (ref 10–24)
BUN: 19 mg/dL (ref 8–27)
CO2: 21 mmol/L (ref 20–29)
Calcium: 9.2 mg/dL (ref 8.6–10.2)
Chloride: 106 mmol/L (ref 96–106)
Creatinine, Ser: 1.49 mg/dL — ABNORMAL HIGH (ref 0.76–1.27)
GFR calc Af Amer: 50 mL/min/{1.73_m2} — ABNORMAL LOW (ref >59)
GFR calc non Af Amer: 43 mL/min/{1.73_m2} — ABNORMAL LOW (ref >59)
Glucose: 96 mg/dL (ref 65–99)
Phosphorus: 3.4 mg/dL (ref 2.8–4.1)
Potassium: 4.6 mmol/L (ref 3.5–5.2)
Sodium: 141 mmol/L (ref 134–144)

## 2019-07-30 LAB — LIPID PANEL WITH LDL/HDL RATIO
Cholesterol, Total: 202 mg/dL — ABNORMAL HIGH (ref 100–199)
HDL: 52 mg/dL (ref >39)
LDL Chol Calc (NIH): 124 mg/dL — ABNORMAL HIGH (ref 0–99)
LDL/HDL Ratio: 2.4 ratio (ref 0.0–3.6)
Triglycerides: 145 mg/dL (ref 0–149)
VLDL Cholesterol Cal: 26 mg/dL (ref 5–40)

## 2019-11-05 ENCOUNTER — Telehealth: Payer: Self-pay | Admitting: Family Medicine

## 2019-11-05 NOTE — Telephone Encounter (Signed)
Left message for patient to call back and schedule Medicare Annual Wellness Visit (AWV) either virtually/audio only or in office. Whichever the patients preference is.  No history of AWV PER [facility]; please schedule at anytime with [facility]-Nurse Health Advisor. 

## 2019-11-11 ENCOUNTER — Encounter: Payer: Self-pay | Admitting: Family Medicine

## 2019-11-11 ENCOUNTER — Ambulatory Visit
Admission: RE | Admit: 2019-11-11 | Discharge: 2019-11-11 | Disposition: A | Discharge status: Home / Self Care | Payer: Medicare Other | Source: Ambulatory Visit | Attending: Family Medicine | Admitting: Family Medicine

## 2019-11-11 ENCOUNTER — Ambulatory Visit
Admission: RE | Admit: 2019-11-11 | Discharge: 2019-11-11 | Disposition: A | Discharge status: Home / Self Care | Payer: Medicare Other | Attending: Family Medicine | Admitting: Family Medicine

## 2019-11-11 ENCOUNTER — Ambulatory Visit (INDEPENDENT_AMBULATORY_CARE_PROVIDER_SITE_OTHER): Payer: Medicare Other | Admitting: Family Medicine

## 2019-11-11 ENCOUNTER — Other Ambulatory Visit: Payer: Self-pay

## 2019-11-11 VITALS — BP 138/80 | HR 72 | Ht 73.0 in | Wt 178.0 lb

## 2019-11-11 DIAGNOSIS — R0609 Other forms of dyspnea: Secondary | ICD-10-CM

## 2019-11-11 DIAGNOSIS — R0600 Dyspnea, unspecified: Secondary | ICD-10-CM

## 2019-11-11 DIAGNOSIS — I10 Essential (primary) hypertension: Secondary | ICD-10-CM | POA: Diagnosis not present

## 2019-11-11 NOTE — Progress Notes (Signed)
Date:  11/11/2019   Name:  Jonathan Salinas   DOB:  [birth year]   MRN:  [id number]   Chief Complaint: Shortness of Breath (started 1 1/2 weeks ago after mowing yard with "smoky lawnmower") and Gastroesophageal Reflux (needs something in the place of ranitidine)  Shortness of Breath This is a new problem. The current episode started more than 1 month ago (6 months). The problem occurs daily. The problem has been waxing and waning. Associated symptoms include sputum production. Pertinent negatives include no abdominal pain, chest pain, claudication, coryza, ear pain, fever, headaches, hemoptysis, leg pain, leg swelling, neck pain, orthopnea, PND, rash, rhinorrhea, sore throat, swollen glands, syncope, vomiting or wheezing. The symptoms are aggravated by exercise and any activity (climbing stairs). Treatments tried: ppi/ace. The treatment provided mild relief. There is no history of CAD or COPD.  Gastroesophageal Reflux He complains of heartburn. He reports no abdominal pain, no chest pain, no choking, no coughing, no dysphagia, no early satiety, no hoarse voice, no nausea, no sore throat or no wheezing. This is a chronic problem. The problem occurs occasionally. Associated symptoms include fatigue. Pertinent negatives include no muscle weakness or orthopnea. He has tried a PPI for the symptoms. The treatment provided mild relief.  Congestive Heart Failure Presents for initial visit. Associated symptoms include fatigue, shortness of breath and unexpected weight change. Pertinent negatives include no abdominal pain, chest pain, chest pressure, claudication, edema, muscle weakness, near-syncope, nocturia, orthopnea, palpitations or paroxysmal nocturnal dyspnea. Past treatments include ACE inhibitors. The treatment provided mild relief. There is no history of CAD.    Lab Results  Component Value Date   CREATININE 1.49 (H) 07/29/2019   BUN 19 07/29/2019   NA 141 07/29/2019   K 4.6 07/29/2019   CL 106  07/29/2019   CO2 21 07/29/2019   Lab Results  Component Value Date   CHOL 202 (H) 07/29/2019   HDL 52 07/29/2019   LDLCALC 124 (H) 07/29/2019   TRIG 145 07/29/2019   No results found for: TSH No results found for: HGBA1C No results found for: WBC, HGB, HCT, MCV, PLT No results found for: ALT, AST, GGT, ALKPHOS, BILITOT   Review of Systems  Constitutional: Positive for fatigue and unexpected weight change. Negative for chills and fever.       4 lb weight gain  HENT: Negative for drooling, ear discharge, ear pain, hoarse voice, rhinorrhea and sore throat.   Respiratory: Positive for sputum production and shortness of breath. Negative for cough, hemoptysis, choking and wheezing.   Cardiovascular: Negative for chest pain, palpitations, orthopnea, claudication, leg swelling, syncope, PND and near-syncope.  Gastrointestinal: Positive for heartburn. Negative for abdominal pain, blood in stool, constipation, diarrhea, dysphagia, nausea and vomiting.  Endocrine: Negative for polydipsia.  Genitourinary: Negative for dysuria, frequency, hematuria, nocturia and urgency.  Musculoskeletal: Negative for back pain, myalgias, muscle weakness and neck pain.  Skin: Negative for rash.  Allergic/Immunologic: Negative for environmental allergies.  Neurological: Negative for dizziness and headaches.  Hematological: Does not bruise/bleed easily.  Psychiatric/Behavioral: Negative for suicidal ideas. The patient is not nervous/anxious.     There are no problems to display for this patient.   Allergies  Allergen Reactions  . Penicillins     Past Surgical History:  Procedure Laterality Date  . COLONOSCOPY  2010   cleared for 10 yrs  . INNER EAR SURGERY     had a perforated eardrum  . TONSILLECTOMY      Social History  Tobacco Use  . Smoking status: Former Games developer  . Smokeless tobacco: Never Used  Substance Use Topics  . Alcohol use: No    Alcohol/week: 0.0 standard drinks  . Drug use:  No     Medication list has been reviewed and updated.  Current Meds  Medication Sig  . lisinopril (ZESTRIL) 10 MG tablet One a day    PHQ 2/9 Scores 07/29/2019 06/19/2018 04/30/2018 07/19/2017  PHQ - 2 Score 1 0 0 0  PHQ- 9 Score 1 0 1 0    BP Readings from Last 3 Encounters:  11/11/19 138/80  07/29/19 140/88  06/19/18 (!) 150/80    Physical Exam Vitals and nursing note reviewed.  HENT:     Head: Normocephalic.     Right Ear: External ear normal.     Left Ear: External ear normal.     Nose: Nose normal.     Mouth/Throat:     Mouth: Mucous membranes are moist.  Eyes:     General: No scleral icterus.       Right eye: No discharge.        Left eye: No discharge.     Conjunctiva/sclera: Conjunctivae normal.     Pupils: Pupils are equal, round, and reactive to light.  Neck:     Thyroid: No thyromegaly.     Vascular: No hepatojugular reflux or JVD.     Trachea: No tracheal deviation.  Cardiovascular:     Rate and Rhythm: Normal rate and regular rhythm.     Chest Wall: PMI is not displaced.     Pulses: Normal pulses.     Heart sounds: Normal heart sounds, S1 normal and S2 normal. No murmur. No systolic murmur. No diastolic murmur. No friction rub. No gallop. No S3 or S4 sounds.   Pulmonary:     Effort: No respiratory distress.     Breath sounds: Normal breath sounds. No decreased breath sounds, wheezing, rhonchi or rales.  Chest:     Chest wall: There is no dullness to percussion.     Comments: increased AP diameter Abdominal:     General: Bowel sounds are normal.     Palpations: Abdomen is soft. There is no mass.     Tenderness: There is no abdominal tenderness. There is no guarding or rebound.  Musculoskeletal:        General: No tenderness. Normal range of motion.     Cervical back: Normal range of motion and neck supple.     Right lower leg: No edema.     Left lower leg: No edema.  Lymphadenopathy:     Cervical: No cervical adenopathy.  Skin:    General: Skin  is warm.     Findings: No rash.  Neurological:     Mental Status: He is alert and oriented to person, place, and time.     Cranial Nerves: No cranial nerve deficit.     Deep Tendon Reflexes: Reflexes are normal and symmetric.     Wt Readings from Last 3 Encounters:  11/11/19 178 lb (80.7 kg)  07/29/19 174 lb (78.9 kg)  06/19/18 176 lb (79.8 kg)    BP 138/80   Pulse 72   Ht 6\' 1"  (1.854 m)   Wt 178 lb (80.7 kg)   SpO2 98%   BMI 23.48 kg/m   Assessment and Plan: 1. Dyspnea on exertion New onset.  Persistent.  Patient has noted dyspnea on exertion over the past 6 months which is recently been increasing in  frequency.  This may occur when climbing steps or walking briskly.  Patient relates no chest pressure.  Also there is no orthopnea, nocturia, nor PND.  Exam notes no rales nor gallop no JVD.  Reading the past cardiology note there was some mention of possible further investigation of cardiac concern which may involve stress testing or echo or combination thereof.  Patient will be referred back to Dr. Lady Gary for evaluation of possible early CHF.  We will also obtain a chest x-ray for evaluation cardiopulmonary concerns. - Ambulatory referral to Cardiology - DG Chest 2 View; Future  2. Essential hypertension Chronic.  Controlled.  Stable.  Current therapy lisinopril 10 mg once a day.  Review of previous renal function is unremarkable and blood pressure today is at 138/80 which is mildly elevated from usual baseline. Patient will continue lisinopril 10 mg once a day until evaluation by Dr. Lady Gary.

## 2019-11-12 ENCOUNTER — Telehealth: Payer: Self-pay | Admitting: Family Medicine

## 2019-11-12 NOTE — Telephone Encounter (Signed)
Copied from CRM [id number]. Topic: General - Other >> Nov 12, 2019  2:12 PM Gwenlyn Fudge wrote: Reason for CRM: Pt called and is requesting to speak with Tyra. Pt states that the message that was left on his vm was not clear. Please advise.

## 2019-11-12 NOTE — Telephone Encounter (Signed)
Pt informed of chest xray. Has appt with cardio on May4 @ 3:30

## 2020-01-18 ENCOUNTER — Other Ambulatory Visit: Payer: Self-pay | Admitting: Family Medicine

## 2020-01-18 DIAGNOSIS — I10 Essential (primary) hypertension: Secondary | ICD-10-CM

## 2020-01-18 NOTE — Telephone Encounter (Signed)
Requested Prescriptions  Pending Prescriptions Disp Refills  . lisinopril (ZESTRIL) 10 MG tablet [Pharmacy Med Name: LISINOPRIL 10 MG TABLET] 90 tablet 1    Sig: TAKE 1 TABLET BY MOUTH EVERY DAY     Cardiovascular:  ACE Inhibitors Failed - 01/18/2020  9:37 AM      Failed - Cr in normal range and within 180 days    Creatinine, Ser  Date Value Ref Range Status  07/29/2019 1.49 (H) 0.76 - 1.27 mg/dL Final         Passed - K in normal range and within 180 days    Potassium  Date Value Ref Range Status  07/29/2019 4.6 3.5 - 5.2 mmol/L Final         Passed - Patient is not pregnant      Passed - Last BP in normal range    BP Readings from Last 1 Encounters:  11/11/19 138/80         Passed - Valid encounter within last 6 months    Recent Outpatient Visits          2 months ago Dyspnea on exertion   [facility] [name], MD   5 months ago Essential hypertension   [facility] [name], MD   1 year ago Chest pain, unspecified type   [facility] [name], MD   1 year ago Inguinal pain, right   [facility] [name], MD   2 years ago Reactive depression   [facility] [name], MD      Future Appointments            In 1 week [name], MD [facility], [facility]

## 2020-01-30 ENCOUNTER — Ambulatory Visit: Payer: Medicare Other | Admitting: Family Medicine

## 2020-04-20 ENCOUNTER — Ambulatory Visit (INDEPENDENT_AMBULATORY_CARE_PROVIDER_SITE_OTHER): Payer: Medicare Other

## 2020-04-20 DIAGNOSIS — Z0000 Encounter for general adult medical examination without abnormal findings: Secondary | ICD-10-CM

## 2020-04-20 NOTE — Patient Instructions (Signed)
Jonathan Salinas , Thank you for taking time to come for your Medicare Wellness Visit. I appreciate your ongoing commitment to your health goals. Please review the following plan we discussed and let me know if I can assist you in the future.   Screening recommendations/referrals: Colonoscopy: no longer required Recommended yearly ophthalmology/optometry visit for glaucoma screening and checkup Recommended yearly dental visit for hygiene and checkup  Vaccinations: Influenza vaccine: declined Pneumococcal vaccine: declined Tdap vaccine: due Shingles vaccine: Shingrix discussed. Please contact your pharmacy for coverage information.  Covid-19: 1st dose 04/02/20  Conditions/risks identified: Recommend increasing physical activity   Next appointment: Follow up in one year for your annual wellness visit.   Preventive Care 76 Years and Older, Male Preventive care refers to lifestyle choices and visits with your health care provider that can promote health and wellness. What does preventive care include?  A yearly physical exam. This is also called an annual well check.  Dental exams once or twice a year.  Routine eye exams. Ask your health care provider how often you should have your eyes checked.  Personal lifestyle choices, including:  Daily care of your teeth and gums.  Regular physical activity.  Eating a healthy diet.  Avoiding tobacco and drug use.  Limiting alcohol use.  Practicing safe sex.  Taking low doses of aspirin every day.  Taking vitamin and mineral supplements as recommended by your health care provider. What happens during an annual well check? The services and screenings done by your health care provider during your annual well check will depend on your age, overall health, lifestyle risk factors, and family history of disease. Counseling  Your health care provider may ask you questions about your:  Alcohol use.  Tobacco use.  Drug use.  Emotional  well-being.  Home and relationship well-being.  Sexual activity.  Eating habits.  History of falls.  Memory and ability to understand (cognition).  Work and work Astronomer. Screening  You may have the following tests or measurements:  Height, weight, and BMI.  Blood pressure.  Lipid and cholesterol levels. These may be checked every 5 years, or more frequently if you are over 69 years old.  Skin check.  Lung cancer screening. You may have this screening every year starting at age 9 if you have a 30-pack-year history of smoking and currently smoke or have quit within the past 15 years.  Fecal occult blood test (FOBT) of the stool. You may have this test every year starting at age 10.  Flexible sigmoidoscopy or colonoscopy. You may have a sigmoidoscopy every 5 years or a colonoscopy every 10 years starting at age 101.  Prostate cancer screening. Recommendations will vary depending on your family history and other risks.  Hepatitis C blood test.  Hepatitis B blood test.  Sexually transmitted disease (STD) testing.  Diabetes screening. This is done by checking your blood sugar (glucose) after you have not eaten for a while (fasting). You may have this done every 1-3 years.  Abdominal aortic aneurysm (AAA) screening. You may need this if you are a current or former smoker.  Osteoporosis. You may be screened starting at age 34 if you are at high risk. Talk with your health care provider about your test results, treatment options, and if necessary, the need for more tests. Vaccines  Your health care provider may recommend certain vaccines, such as:  Influenza vaccine. This is recommended every year.  Tetanus, diphtheria, and acellular pertussis (Tdap, Td) vaccine. You may need a  Td booster every 10 years.  Zoster vaccine. You may need this after age 41.  Pneumococcal 13-valent conjugate (PCV13) vaccine. One dose is recommended after age 78.  Pneumococcal  polysaccharide (PPSV23) vaccine. One dose is recommended after age 81. Talk to your health care provider about which screenings and vaccines you need and how often you need them. This information is not intended to replace advice given to you by your health care provider. Make sure you discuss any questions you have with your health care provider. Document Released: 08/07/2015 Document Revised: 03/30/2016 Document Reviewed: 05/12/2015 Elsevier Interactive Patient Education  2017 [place] Prevention in the Home Falls can cause injuries. They can happen to people of all ages. There are many things you can do to make your home safe and to help prevent falls. What can I do on the outside of my home?  Regularly fix the edges of walkways and driveways and fix any cracks.  Remove anything that might make you trip as you walk through a door, such as a raised step or threshold.  Trim any bushes or trees on the path to your home.  Use bright outdoor lighting.  Clear any walking paths of anything that might make someone trip, such as rocks or tools.  Regularly check to see if handrails are loose or broken. Make sure that both sides of any steps have handrails.  Any raised decks and porches should have guardrails on the edges.  Have any leaves, snow, or ice cleared regularly.  Use sand or salt on walking paths during winter.  Clean up any spills in your garage right away. This includes oil or grease spills. What can I do in the bathroom?  Use night lights.  Install grab bars by the toilet and in the tub and shower. Do not use towel bars as grab bars.  Use non-skid mats or decals in the tub or shower.  If you need to sit down in the shower, use a plastic, non-slip stool.  Keep the floor dry. Clean up any water that spills on the floor as soon as it happens.  Remove soap buildup in the tub or shower regularly.  Attach bath mats securely with double-sided non-slip rug  tape.  Do not have throw rugs and other things on the floor that can make you trip. What can I do in the bedroom?  Use night lights.  Make sure that you have a light by your bed that is easy to reach.  Do not use any sheets or blankets that are too big for your bed. They should not hang down onto the floor.  Have a firm chair that has side arms. You can use this for support while you get dressed.  Do not have throw rugs and other things on the floor that can make you trip. What can I do in the kitchen?  Clean up any spills right away.  Avoid walking on wet floors.  Keep items that you use a lot in easy-to-reach places.  If you need to reach something above you, use a strong step stool that has a grab bar.  Keep electrical cords out of the way.  Do not use floor polish or wax that makes floors slippery. If you must use wax, use non-skid floor wax.  Do not have throw rugs and other things on the floor that can make you trip. What can I do with my stairs?  Do not leave any items on the stairs.  Make sure that there are handrails on both sides of the stairs and use them. Fix handrails that are broken or loose. Make sure that handrails are as long as the stairways.  Check any carpeting to make sure that it is firmly attached to the stairs. Fix any carpet that is loose or worn.  Avoid having throw rugs at the top or bottom of the stairs. If you do have throw rugs, attach them to the floor with carpet tape.  Make sure that you have a light switch at the top of the stairs and the bottom of the stairs. If you do not have them, ask someone to add them for you. What else can I do to help prevent falls?  Wear shoes that:  Do not have high heels.  Have rubber bottoms.  Are comfortable and fit you well.  Are closed at the toe. Do not wear sandals.  If you use a stepladder:  Make sure that it is fully opened. Do not climb a closed stepladder.  Make sure that both sides of the  stepladder are locked into place.  Ask someone to hold it for you, if possible.  Clearly mark and make sure that you can see:  Any grab bars or handrails.  First and last steps.  Where the edge of each step is.  Use tools that help you move around (mobility aids) if they are needed. These include:  Canes.  Walkers.  Scooters.  Crutches.  Turn on the lights when you go into a dark area. Replace any light bulbs as soon as they burn out.  Set up your furniture so you have a clear path. Avoid moving your furniture around.  If any of your floors are uneven, fix them.  If there are any pets around you, be aware of where they are.  Review your medicines with your doctor. Some medicines can make you feel dizzy. This can increase your chance of falling. Ask your doctor what other things that you can do to help prevent falls. This information is not intended to replace advice given to you by your health care provider. Make sure you discuss any questions you have with your health care provider. Document Released: 05/07/2009 Document Revised: 12/17/2015 Document Reviewed: 08/15/2014 Elsevier Interactive Patient Education  2017 [employer].

## 2020-04-20 NOTE — Progress Notes (Signed)
Subjective:   Jonathan Salinas is a 84 y.o. male who presents for an Initial Medicare Annual Wellness Visit.  Virtual Visit via Telephone Note  I connected with  [name] on 04/20/20 at  9:20 AM EDT by telephone and verified that I am speaking with the correct person using two identifiers.  Medicare Annual Wellness visit completed telephonically due to Covid-19 pandemic.   Location: Patient: home Provider: [facility]   I discussed the limitations, risks, security and privacy concerns of performing an evaluation and management service by telephone and the availability of in person appointments. The patient expressed understanding and agreed to proceed.  Unable to perform video visit due to video visit attempted and failed and/or patient does not have video capability.   Some vital signs may be absent or patient reported.   Reather Littler, LPN    Review of Systems     Cardiac Risk Factors include: advanced age (>65men, >15 women);male gender;hypertension     Objective:    There were no vitals filed for this visit. There is no height or weight on file to calculate BMI.  No flowsheet data found.  Current Medications (verified) Outpatient Encounter Medications as of 04/20/2020  Medication Sig  . lisinopril (ZESTRIL) 10 MG tablet TAKE 1 TABLET BY MOUTH EVERY DAY   No facility-administered encounter medications on file as of 04/20/2020.    Allergies (verified) Penicillins   History: Past Medical History:  Diagnosis Date  . GERD (gastroesophageal reflux disease)   . Hypertension    Past Surgical History:  Procedure Laterality Date  . COLONOSCOPY  2010   cleared for 10 yrs  . INNER EAR SURGERY     had a perforated eardrum  . TONSILLECTOMY     Family History  Problem Relation Age of Onset  . Cancer Mother   . Cancer Father   . Prostate cancer Neg Hx   . Bladder Cancer Neg Hx   . Kidney cancer Neg Hx    Social History   Socioeconomic History  . Marital  status: Divorced    Spouse name: Not on file  . Number of children: Not on file  . Years of education: Not on file  . Highest education level: Not on file  Occupational History  . Not on file  Tobacco Use  . Smoking status: Former Games developer  . Smokeless tobacco: Never Used  Vaping Use  . Vaping Use: Never used  Substance and Sexual Activity  . Alcohol use: No    Alcohol/week: 0.0 standard drinks  . Drug use: No  . Sexual activity: Yes  Other Topics Concern  . Not on file  Social History Narrative  . Not on file   Social Determinants of Health   Financial Resource Strain: Low Risk   . Difficulty of Paying Living Expenses: Not hard at all  Food Insecurity: No Food Insecurity  . Worried About Programme researcher, broadcasting/film/video in the Last Year: Never true  . Ran Out of Food in the Last Year: Never true  Transportation Needs: No Transportation Needs  . Lack of Transportation (Medical): No  . Lack of Transportation (Non-Medical): No  Physical Activity: Inactive  . Days of Exercise per Week: 0 days  . Minutes of Exercise per Session: 0 min  Stress: No Stress Concern Present  . Feeling of Stress : Not at all  Social Connections: Socially Isolated  . Frequency of Communication with Friends and Family: More than three times a week  .  Frequency of Social Gatherings with Friends and Family: More than three times a week  . Attends Religious Services: Never  . Active Member of Clubs or Organizations: No  . Attends Banker Meetings: Never  . Marital Status: Divorced    Tobacco Counseling Counseling given: Not Answered   Clinical Intake:  Pre-visit preparation completed: Yes  Pain : No/denies pain     Nutritional Risks: None  How often do you need to have someone help you when you read instructions, pamphlets, or other written materials from your doctor or pharmacy?: 1 - Never    Interpreter Needed?: No  Information entered by :: Reather Littler LPN   Activities of Daily  Living In your present state of health, do you have any difficulty performing the following activities: 04/20/2020  Hearing? Y  Comment pt wear hearing aids  Vision? N  Difficulty concentrating or making decisions? N  Walking or climbing stairs? N  Dressing or bathing? N  Doing errands, shopping? N  Preparing Food and eating ? N  Using the Toilet? N  In the past six months, have you accidently leaked urine? N  Do you have problems with loss of bowel control? N  Managing your Medications? N  Managing your Finances? N  Housekeeping or managing your Housekeeping? N  Some recent data might be hidden    Patient Care Team: [name], MD as PCP - General (Family Medicine)  Indicate any recent Medical Services you may have received from other than [facility] providers in the past year (date may be approximate).     Assessment:   This is a routine wellness examination for Jonh.  Hearing/Vision screen  Hearing Screening   125Hz  250Hz  500Hz  1000Hz  2000Hz  3000Hz  4000Hz  6000Hz  8000Hz   Right ear:           Left ear:           Comments: Pt wears hearing aids   Vision Screening Comments: Past due for eye exam. Declines referral.   Dietary issues and exercise activities discussed: Current Exercise Habits: The patient does not participate in regular exercise at present, Exercise limited by: None identified  Goals    . Increase physical activity     Recommend increasing physical activity to at least 3 days per week.       Depression Screen PHQ 2/9 Scores 04/20/2020 07/29/2019 06/19/2018 04/30/2018 07/19/2017 08/25/2015 05/18/2015  PHQ - 2 Score 0 1 0 0 0 0 1  PHQ- 9 Score - 1 0 1 0 - -    Fall Risk Fall Risk  04/20/2020 07/29/2019 07/19/2017 08/25/2015 05/18/2015  Falls in the past year? 0 0 No No No  Number falls in past yr: 0 - - - -  Injury with Fall? 0 - - - -  Risk for fall due to : No Fall Risks - - - -  Follow up Falls prevention discussed Falls evaluation completed - - -     Any stairs in or around the home? No  If so, are there any without handrails? No  Home free of loose throw rugs in walkways, pet beds, electrical cords, etc? Yes  Adequate lighting in your home to reduce risk of falls? Yes   ASSISTIVE DEVICES UTILIZED TO PREVENT FALLS:   Life alert? No  Use of a cane, walker or w/c? No  Grab bars in the bathroom? No  Shower chair or bench in shower? No  Elevated toilet seat or a handicapped toilet? No  TIMED UP AND GO:  Was the test performed? No . Telephonic visit.   Cognitive Function: pt declined 6CIT for 2021 AWV; states no memory issues.         Immunizations Immunization History  Administered Date(s) Administered  . Moderna SARS-COVID-2 Vaccination 04/02/2020    TDAP status: Due, Education has been provided regarding the importance of this vaccine. Advised may receive this vaccine at local pharmacy or Health Dept. Aware to provide a copy of the vaccination record if obtained from local pharmacy or Health Dept. Verbalized acceptance and understanding.   Flu Vaccine status: Declined, Education has been provided regarding the importance of this vaccine but patient still declined. Advised may receive this vaccine at local pharmacy or Health Dept. Aware to provide a copy of the vaccination record if obtained from local pharmacy or Health Dept. Verbalized acceptance and understanding.  / Pneumococcal vaccine status: Declined,  Education has been provided regarding the importance of this vaccine but patient still declined. Advised may receive this vaccine at local pharmacy or Health Dept. Aware to provide a copy of the vaccination record if obtained from local pharmacy or Health Dept. Verbalized acceptance and understanding.    Covid-19 vaccine status: Completed vaccines - scheduled for second dose on /  Qualifies for Shingles Vaccine? Yes   Zostavax completed No   Shingrix Completed?: No.    Education has been provided regarding the  importance of this vaccine. Patient has been advised to call insurance company to determine out of pocket expense if they have not yet received this vaccine. Advised may also receive vaccine at local pharmacy or Health Dept. Verbalized acceptance and understanding.  Screening Tests Health Maintenance  Topic Date Due  . INFLUENZA VACCINE  02/23/2020  . TETANUS/TDAP  07/28/2020 (Originally 10/15/1954)  . PNA vac Low Risk Adult (2 of 2 - PPSV23) 07/28/2020 (Originally 08/24/2016)  . COVID-19 Vaccine (2 - Moderna 2-dose series) 04/30/2020    Health Maintenance  Health Maintenance Due  Topic Date Due  . INFLUENZA VACCINE  02/23/2020    Colorectal cancer screening: No longer required.   Lung Cancer Screening: (Low Dose CT Chest recommended if Age 81-80 years, 30 pack-year currently smoking OR have quit w/in 15years.) does not qualify.   Additional Screening:  Hepatitis C Screening: does not qualify  Vision Screening: Recommended annual ophthalmology exams for early detection of glaucoma and other disorders of the eye. Is the patient up to date with their annual eye exam?  No  Who is the provider or what is the name of the office in which the patient attends annual eye exams? Not established If pt is not established with a provider, would they like to be referred to a provider to establish care? No .  - pt declined  Dental Screening: Recommended annual dental exams for proper oral hygiene  Community Resource Referral / Chronic Care Management: CRR required this visit?  No   CCM required this visit?  No       Plan:     I have personally reviewed and noted the following in the patient's chart:   . Medical and social history . Use of alcohol, tobacco or illicit drugs  . Current medications and supplements . Functional ability and status . Nutritional status . Physical activity . Advanced directives . List of other physicians . Hospitalizations, surgeries, and ER visits in  previous 12 months . Vitals . Screenings to include cognitive, depression, and falls . Referrals and appointments  In addition, I  have reviewed and discussed with patient certain preventive protocols, quality metrics, and best practice recommendations. A written personalized care plan for preventive services as well as general preventive health recommendations were provided to patient.     Reather LittlerKasey Naasir Carreira, LPN   1/[id number]/47829/27/2021   Nurse Notes: pt advised to schedule appt in January due for labs at that time.

## 2020-07-09 ENCOUNTER — Other Ambulatory Visit: Payer: Self-pay | Admitting: Family Medicine

## 2020-07-09 DIAGNOSIS — I10 Essential (primary) hypertension: Secondary | ICD-10-CM

## 2020-07-09 NOTE — Telephone Encounter (Signed)
Requested medications are due for refill today yes  Requested medications are on the active medication list yes  Last refill 9/21  Last visit 4/21  Future visit scheduled no  Notes to clinic Failed protocol due to no valid visit within 6  months. No upcoming appt scheduled.

## 2020-07-13 ENCOUNTER — Ambulatory Visit: Payer: Medicare Other | Admitting: Family Medicine

## 2021-04-26 ENCOUNTER — Encounter: Payer: Self-pay | Admitting: Emergency Medicine

## 2021-04-26 ENCOUNTER — Ambulatory Visit
Admission: EM | Admit: 2021-04-26 | Discharge: 2021-04-26 | Disposition: A | Discharge status: Home / Self Care | Payer: Medicare Other | Attending: Family Medicine | Admitting: Family Medicine

## 2021-04-26 ENCOUNTER — Ambulatory Visit (INDEPENDENT_AMBULATORY_CARE_PROVIDER_SITE_OTHER): Payer: Medicare Other

## 2021-04-26 ENCOUNTER — Other Ambulatory Visit: Payer: Self-pay

## 2021-04-26 ENCOUNTER — Telehealth: Payer: Self-pay

## 2021-04-26 DIAGNOSIS — J209 Acute bronchitis, unspecified: Secondary | ICD-10-CM

## 2021-04-26 DIAGNOSIS — U071 COVID-19: Secondary | ICD-10-CM | POA: Diagnosis present

## 2021-04-26 MED ORDER — DOXYCYCLINE HYCLATE 100 MG PO CAPS: 100.0000 mg | ORAL_CAPSULE | Freq: Two times a day (BID) | ORAL | 0 refills | Status: AC

## 2021-04-26 NOTE — ED Provider Notes (Addendum)
[facility]    CSN: [id number] Arrival date & time: 04/26/21  1300      History   Chief Complaint Chief Complaint  Patient presents with   Wheezing    HPI 85 year old male presents with the above complaint.  Patient states that his symptoms started approximately 5 days ago.  He reports chest congestion, wheezing.  He has had a low-grade temperature of 99.  He states that he has had cough as well.  Symptoms have not improved.  No fever.  No shortness of breath.  No relieving factors.  No other complaints.   Past Medical History:  Diagnosis Date   GERD (gastroesophageal reflux disease)    Hypertension     Patient Active Problem List   Diagnosis Date Noted   Essential hypertension 06/25/2018    Past Surgical History:  Procedure Laterality Date   COLONOSCOPY  2010   cleared for 10 yrs   INNER EAR SURGERY     had a perforated eardrum   TONSILLECTOMY         Home Medications    Prior to Admission medications   Medication Sig Start Date End Date Taking? Authorizing Provider  doxycycline (VIBRAMYCIN) 100 MG capsule Take 1 capsule (100 mg total) by mouth 2 (two) times daily. 04/26/21  Yes Ravon Mcilhenny G, DO  lisinopril (ZESTRIL) 10 MG tablet TAKE 1 TABLET BY MOUTH EVERY DAY 01/18/20  Yes [name], MD    Family History Family History  Problem Relation Age of Onset   Cancer Mother    Cancer Father    Prostate cancer Neg Hx    Bladder Cancer Neg Hx    Kidney cancer Neg Hx     Social History Social History   Tobacco Use   Smoking status: Former   Smokeless tobacco: Never  Building services engineer Use: Never used  Substance Use Topics   Alcohol use: No    Alcohol/week: 0.0 standard drinks   Drug use: No     Allergies   Penicillins   Review of Systems Review of Systems  Constitutional:  Negative for fever.  Respiratory:  Positive for cough and wheezing. Negative for shortness of breath.    Physical Exam Triage Vital Signs ED Triage  Vitals  Enc Vitals Group     BP 04/26/21 1346 (!) 149/96     Pulse Rate 04/26/21 1346 95     Resp 04/26/21 1346 18     Temp 04/26/21 1346 98.4 F (36.9 C)     Temp Source 04/26/21 1346 Oral     SpO2 04/26/21 1346 95 %     Weight 04/26/21 1344 177 lb 14.6 oz (80.7 kg)     Height 04/26/21 1344 6\' 1"  (1.854 m)     Head Circumference --      Peak Flow --      Pain Score 04/26/21 1344 0     Pain Loc --      Pain Edu? --      Excl. in GC? --    Updated Vital Signs BP (!) 149/96 (BP Location: Left Arm)   Pulse 95   Temp 98.4 F (36.9 C) (Oral)   Resp 18   Ht 6\' 1"  (1.854 m)   Wt 80.7 kg   SpO2 95%   BMI 23.47 kg/m   Visual Acuity Right Eye Distance:   Left Eye Distance:   Bilateral Distance:    Right Eye Near:   Left Eye Near:  Bilateral Near:     Physical Exam Vitals and nursing note reviewed.  Constitutional:      General: He is not in acute distress.    Appearance: Normal appearance. He is not ill-appearing.  HENT:     Head: Normocephalic and atraumatic.  Eyes:     General:        Right eye: No discharge.        Left eye: No discharge.     Conjunctiva/sclera: Conjunctivae normal.  Cardiovascular:     Rate and Rhythm: Normal rate and regular rhythm.  Pulmonary:     Effort: Pulmonary effort is normal.     Breath sounds: Rales present.  Neurological:     Mental Status: He is alert.  Psychiatric:        Mood and Affect: Mood normal.        Behavior: Behavior normal.     UC Treatments / Results  Labs (all labs ordered are listed, but only abnormal results are displayed) Labs Reviewed  SARS CORONAVIRUS 2 (TAT 6-24 HRS) - Abnormal; Notable for the following components:      Result Value   SARS Coronavirus 2 POSITIVE (*)    All other components within normal limits    EKG   Radiology DG Chest 2 View  Result Date: 04/26/2021 CLINICAL DATA:  Chest congestion, wheezing, and low-grade fever for the past 5 days. EXAM: CHEST - 2 VIEW COMPARISON:  Chest  x-ray dated November 11, 2019. FINDINGS: The heart size and mediastinal contours are within normal limits. Mild left lower lobe atelectasis. No focal consolidation, pleural effusion, or pneumothorax. No acute osseous abnormality. IMPRESSION: 1. Mild left lower lobe atelectasis. Electronically Signed   By: Obie Dredge M.D.   On: 04/26/2021 14:56    Procedures Procedures (including critical care time)  Medications Ordered in UC Medications - No data to display  Initial Impression / Assessment and Plan / UC Course  I have reviewed the triage vital signs and the nursing notes.  Pertinent labs & imaging results that were available during my care of the patient were reviewed by me and considered in my medical decision making (see chart for details).    85 year old male presents with respiratory symptoms. Initially treated for suspected bronchitis with doxycycline. COVID testing returned positive. Supportive care (out of window for antiviral treatment).  Final Clinical Impressions(s) / UC Diagnoses   Final diagnoses:  COVID   Discharge Instructions   None    ED Prescriptions     Medication Sig Dispense Auth. Provider   doxycycline (VIBRAMYCIN) 100 MG capsule Take 1 capsule (100 mg total) by mouth 2 (two) times daily. 14 capsule Everlene Other G, DO      PDMP not reviewed this encounter.     Tommie Sams, [place] 04/27/21 2204

## 2021-04-26 NOTE — Telephone Encounter (Signed)
Noted  KP 

## 2021-04-26 NOTE — ED Triage Notes (Signed)
Pt c/o wheezing, chest congestion, and "fever" per pt (99.1). Started about 5 days ago.

## 2021-04-26 NOTE — Telephone Encounter (Signed)
Called patient to advise going to urgent care for treatment of his symptoms.

## 2021-04-27 LAB — SARS CORONAVIRUS 2 (TAT 6-24 HRS): SARS Coronavirus 2: POSITIVE — AB

## 2021-05-03 ENCOUNTER — Ambulatory Visit: Payer: Medicare Other | Admitting: Family Medicine

## 2021-05-03 ENCOUNTER — Other Ambulatory Visit: Payer: Self-pay

## 2021-05-03 ENCOUNTER — Emergency Department: Payer: Medicare Other

## 2021-05-03 ENCOUNTER — Ambulatory Visit: Payer: Self-pay

## 2021-05-03 ENCOUNTER — Inpatient Hospital Stay
Admission: EM | Admit: 2021-05-03 | Discharge: 2021-06-24 | DRG: 163 | Disposition: E | Payer: Medicare Other | Attending: Internal Medicine | Admitting: Internal Medicine

## 2021-05-03 DIAGNOSIS — A419 Sepsis, unspecified organism: Secondary | ICD-10-CM | POA: Diagnosis not present

## 2021-05-03 DIAGNOSIS — Z515 Encounter for palliative care: Secondary | ICD-10-CM

## 2021-05-03 DIAGNOSIS — J151 Pneumonia due to Pseudomonas: Secondary | ICD-10-CM | POA: Diagnosis not present

## 2021-05-03 DIAGNOSIS — I82409 Acute embolism and thrombosis of unspecified deep veins of unspecified lower extremity: Secondary | ICD-10-CM | POA: Diagnosis present

## 2021-05-03 DIAGNOSIS — J80 Acute respiratory distress syndrome: Secondary | ICD-10-CM | POA: Diagnosis present

## 2021-05-03 DIAGNOSIS — D72829 Elevated white blood cell count, unspecified: Secondary | ICD-10-CM

## 2021-05-03 DIAGNOSIS — E43 Unspecified severe protein-calorie malnutrition: Secondary | ICD-10-CM | POA: Diagnosis present

## 2021-05-03 DIAGNOSIS — J1282 Pneumonia due to coronavirus disease 2019: Secondary | ICD-10-CM | POA: Diagnosis present

## 2021-05-03 DIAGNOSIS — I480 Paroxysmal atrial fibrillation: Secondary | ICD-10-CM | POA: Diagnosis not present

## 2021-05-03 DIAGNOSIS — R6521 Severe sepsis with septic shock: Secondary | ICD-10-CM | POA: Diagnosis not present

## 2021-05-03 DIAGNOSIS — E8809 Other disorders of plasma-protein metabolism, not elsewhere classified: Secondary | ICD-10-CM | POA: Diagnosis not present

## 2021-05-03 DIAGNOSIS — T45515A Adverse effect of anticoagulants, initial encounter: Secondary | ICD-10-CM | POA: Diagnosis not present

## 2021-05-03 DIAGNOSIS — D75829 Heparin-induced thrombocytopenia, unspecified: Secondary | ICD-10-CM | POA: Diagnosis not present

## 2021-05-03 DIAGNOSIS — A4189 Other specified sepsis: Secondary | ICD-10-CM | POA: Diagnosis not present

## 2021-05-03 DIAGNOSIS — N179 Acute kidney failure, unspecified: Secondary | ICD-10-CM

## 2021-05-03 DIAGNOSIS — N400 Enlarged prostate without lower urinary tract symptoms: Secondary | ICD-10-CM | POA: Diagnosis not present

## 2021-05-03 DIAGNOSIS — N1831 Chronic kidney disease, stage 3a: Secondary | ICD-10-CM | POA: Diagnosis present

## 2021-05-03 DIAGNOSIS — I129 Hypertensive chronic kidney disease with stage 1 through stage 4 chronic kidney disease, or unspecified chronic kidney disease: Secondary | ICD-10-CM | POA: Diagnosis present

## 2021-05-03 DIAGNOSIS — L89153 Pressure ulcer of sacral region, stage 3: Secondary | ICD-10-CM | POA: Diagnosis present

## 2021-05-03 DIAGNOSIS — Y95 Nosocomial condition: Secondary | ICD-10-CM | POA: Diagnosis not present

## 2021-05-03 DIAGNOSIS — E872 Acidosis, unspecified: Secondary | ICD-10-CM | POA: Diagnosis present

## 2021-05-03 DIAGNOSIS — U071 COVID-19: Principal | ICD-10-CM | POA: Diagnosis present

## 2021-05-03 DIAGNOSIS — Z66 Do not resuscitate: Secondary | ICD-10-CM | POA: Diagnosis not present

## 2021-05-03 DIAGNOSIS — I248 Other forms of acute ischemic heart disease: Secondary | ICD-10-CM | POA: Diagnosis not present

## 2021-05-03 DIAGNOSIS — G928 Other toxic encephalopathy: Secondary | ICD-10-CM | POA: Diagnosis not present

## 2021-05-03 DIAGNOSIS — R0602 Shortness of breath: Secondary | ICD-10-CM | POA: Diagnosis present

## 2021-05-03 DIAGNOSIS — R7401 Elevation of levels of liver transaminase levels: Secondary | ICD-10-CM | POA: Diagnosis not present

## 2021-05-03 DIAGNOSIS — J189 Pneumonia, unspecified organism: Secondary | ICD-10-CM | POA: Diagnosis not present

## 2021-05-03 DIAGNOSIS — E871 Hypo-osmolality and hyponatremia: Secondary | ICD-10-CM | POA: Diagnosis not present

## 2021-05-03 DIAGNOSIS — Z87891 Personal history of nicotine dependence: Secondary | ICD-10-CM

## 2021-05-03 DIAGNOSIS — E875 Hyperkalemia: Secondary | ICD-10-CM | POA: Diagnosis not present

## 2021-05-03 DIAGNOSIS — I82442 Acute embolism and thrombosis of left tibial vein: Secondary | ICD-10-CM | POA: Diagnosis present

## 2021-05-03 DIAGNOSIS — Z809 Family history of malignant neoplasm, unspecified: Secondary | ICD-10-CM

## 2021-05-03 DIAGNOSIS — Z4659 Encounter for fitting and adjustment of other gastrointestinal appliance and device: Secondary | ICD-10-CM

## 2021-05-03 DIAGNOSIS — I2699 Other pulmonary embolism without acute cor pulmonale: Secondary | ICD-10-CM | POA: Diagnosis present

## 2021-05-03 DIAGNOSIS — Z28310 Unvaccinated for covid-19: Secondary | ICD-10-CM

## 2021-05-03 DIAGNOSIS — R0902 Hypoxemia: Secondary | ICD-10-CM | POA: Diagnosis not present

## 2021-05-03 DIAGNOSIS — M79606 Pain in leg, unspecified: Secondary | ICD-10-CM

## 2021-05-03 DIAGNOSIS — Z7189 Other specified counseling: Secondary | ICD-10-CM | POA: Diagnosis not present

## 2021-05-03 DIAGNOSIS — K219 Gastro-esophageal reflux disease without esophagitis: Secondary | ICD-10-CM | POA: Diagnosis present

## 2021-05-03 DIAGNOSIS — R54 Age-related physical debility: Secondary | ICD-10-CM | POA: Diagnosis present

## 2021-05-03 DIAGNOSIS — K921 Melena: Secondary | ICD-10-CM | POA: Diagnosis not present

## 2021-05-03 DIAGNOSIS — J9601 Acute respiratory failure with hypoxia: Secondary | ICD-10-CM

## 2021-05-03 DIAGNOSIS — R0603 Acute respiratory distress: Secondary | ICD-10-CM | POA: Diagnosis not present

## 2021-05-03 DIAGNOSIS — Z978 Presence of other specified devices: Secondary | ICD-10-CM

## 2021-05-03 DIAGNOSIS — Z452 Encounter for adjustment and management of vascular access device: Secondary | ICD-10-CM

## 2021-05-03 DIAGNOSIS — Z79899 Other long term (current) drug therapy: Secondary | ICD-10-CM

## 2021-05-03 DIAGNOSIS — H9190 Unspecified hearing loss, unspecified ear: Secondary | ICD-10-CM | POA: Diagnosis present

## 2021-05-03 DIAGNOSIS — Z880 Allergy status to penicillin: Secondary | ICD-10-CM

## 2021-05-03 DIAGNOSIS — I471 Supraventricular tachycardia: Secondary | ICD-10-CM | POA: Diagnosis not present

## 2021-05-03 DIAGNOSIS — R0600 Dyspnea, unspecified: Secondary | ICD-10-CM

## 2021-05-03 DIAGNOSIS — J810 Acute pulmonary edema: Secondary | ICD-10-CM | POA: Diagnosis not present

## 2021-05-03 DIAGNOSIS — I519 Heart disease, unspecified: Secondary | ICD-10-CM | POA: Diagnosis not present

## 2021-05-03 DIAGNOSIS — N170 Acute kidney failure with tubular necrosis: Secondary | ICD-10-CM | POA: Diagnosis present

## 2021-05-03 LAB — LACTIC ACID, PLASMA
Lactic Acid, Venous: 6.2 mmol/L (ref 0.5–1.9)
Lactic Acid, Venous: 2.8 mmol/L (ref 0.5–1.9)

## 2021-05-03 LAB — CBC WITH DIFFERENTIAL/PLATELET
Abs Immature Granulocytes: 0.99 10*3/uL — ABNORMAL HIGH (ref 0.00–0.07)
Basophils Absolute: 0.1 10*3/uL (ref 0.0–0.1)
Basophils Relative: 1 %
Eosinophils Absolute: 0 10*3/uL (ref 0.0–0.5)
Eosinophils Relative: 0 %
HCT: 41.3 % (ref 39.0–52.0)
Hemoglobin: 14.1 g/dL (ref 13.0–17.0)
Immature Granulocytes: 5 %
Lymphocytes Relative: 3 %
Lymphs Abs: 0.7 10*3/uL (ref 0.7–4.0)
MCH: 31.3 pg (ref 26.0–34.0)
MCHC: 34.1 g/dL (ref 30.0–36.0)
MCV: 91.8 fL (ref 80.0–100.0)
Monocytes Absolute: 1 10*3/uL (ref 0.1–1.0)
Monocytes Relative: 5 %
Neutro Abs: 18.1 10*3/uL — ABNORMAL HIGH (ref 1.7–7.7)
Neutrophils Relative %: 86 %
Platelets: 315 10*3/uL (ref 150–400)
RBC: 4.5 MIL/uL (ref 4.22–5.81)
RDW: 13.2 % (ref 11.5–15.5)
WBC: 21 10*3/uL — ABNORMAL HIGH (ref 4.0–10.5)
nRBC: 0 % (ref 0.0–0.2)

## 2021-05-03 LAB — PHOSPHORUS: Phosphorus: 4.1 mg/dL (ref 2.5–4.6)

## 2021-05-03 LAB — APTT: aPTT: 31 seconds (ref 24–36)

## 2021-05-03 LAB — CREATININE, SERUM
Creatinine, Ser: 1.52 mg/dL — ABNORMAL HIGH (ref 0.61–1.24)
GFR, Estimated: 45 mL/min — ABNORMAL LOW (ref >60)

## 2021-05-03 LAB — COMPREHENSIVE METABOLIC PANEL
ALT: 32 U/L (ref 0–44)
AST: 55 U/L — ABNORMAL HIGH (ref 15–41)
Albumin: 2.4 g/dL — ABNORMAL LOW (ref 3.5–5.0)
Alkaline Phosphatase: 73 U/L (ref 38–126)
Anion gap: 21 — ABNORMAL HIGH (ref 5–15)
BUN: 34 mg/dL — ABNORMAL HIGH (ref 8–23)
CO2: 15 mmol/L — ABNORMAL LOW (ref 22–32)
Calcium: 8.5 mg/dL — ABNORMAL LOW (ref 8.9–10.3)
Chloride: 101 mmol/L (ref 98–111)
Creatinine, Ser: 1.59 mg/dL — ABNORMAL HIGH (ref 0.61–1.24)
GFR, Estimated: 43 mL/min — ABNORMAL LOW (ref >60)
Glucose, Bld: 184 mg/dL — ABNORMAL HIGH (ref 70–99)
Potassium: 3.4 mmol/L — ABNORMAL LOW (ref 3.5–5.1)
Sodium: 137 mmol/L (ref 135–145)
Total Bilirubin: 1.1 mg/dL (ref 0.3–1.2)
Total Protein: 7.5 g/dL (ref 6.5–8.1)

## 2021-05-03 LAB — GLUCOSE, CAPILLARY
Glucose-Capillary: 135 mg/dL — ABNORMAL HIGH (ref 70–99)
Glucose-Capillary: 150 mg/dL — ABNORMAL HIGH (ref 70–99)

## 2021-05-03 LAB — MAGNESIUM: Magnesium: 2.2 mg/dL (ref 1.7–2.4)

## 2021-05-03 LAB — C-REACTIVE PROTEIN: CRP: 27.5 mg/dL — ABNORMAL HIGH (ref <1.0)

## 2021-05-03 LAB — TRIGLYCERIDES: Triglycerides: 156 mg/dL — ABNORMAL HIGH (ref <150)

## 2021-05-03 LAB — TROPONIN I (HIGH SENSITIVITY)
Troponin I (High Sensitivity): 224 ng/L (ref <18)
Troponin I (High Sensitivity): 223 ng/L (ref <18)

## 2021-05-03 LAB — PROTIME-INR
INR: 1.5 — ABNORMAL HIGH (ref 0.8–1.2)
Prothrombin Time: 17.7 seconds — ABNORMAL HIGH (ref 11.4–15.2)

## 2021-05-03 LAB — BRAIN NATRIURETIC PEPTIDE: B Natriuretic Peptide: 66 pg/mL (ref 0.0–100.0)

## 2021-05-03 LAB — TSH: TSH: 0.82 u[IU]/mL (ref 0.350–4.500)

## 2021-05-03 LAB — MRSA NEXT GEN BY PCR, NASAL: MRSA by PCR Next Gen: NOT DETECTED

## 2021-05-03 LAB — D-DIMER, QUANTITATIVE: D-Dimer, Quant: >20 ug/mL-FEU — ABNORMAL HIGH (ref 0.00–0.50)

## 2021-05-03 LAB — T4, FREE: Free T4: 1.64 ng/dL — ABNORMAL HIGH (ref 0.61–1.12)

## 2021-05-03 LAB — FERRITIN: Ferritin: 749 ng/mL — ABNORMAL HIGH (ref 24–336)

## 2021-05-03 LAB — PROCALCITONIN: Procalcitonin: 0.75 ng/mL

## 2021-05-03 LAB — FIBRINOGEN: Fibrinogen: 535 mg/dL — ABNORMAL HIGH (ref 210–475)

## 2021-05-03 LAB — LACTATE DEHYDROGENASE: LDH: 395 U/L — ABNORMAL HIGH (ref 98–192)

## 2021-05-03 MED ORDER — ONDANSETRON HCL 4 MG/2ML IJ SOLN: 4.0000 mg | Freq: Four times a day (QID) | INTRAMUSCULAR | Status: DC | PRN

## 2021-05-03 MED ORDER — HEPARIN BOLUS VIA INFUSION
4000.0000 [IU] | Freq: Once | INTRAVENOUS | Status: AC
  Administered 2021-05-03: 4000 [IU] via INTRAVENOUS
  Filled 2021-05-03: qty 4000

## 2021-05-03 MED ORDER — HEPARIN (PORCINE) 25000 UT/250ML-% IV SOLN
1550.0000 [IU]/h | INTRAVENOUS | Status: DC
  Administered 2021-05-03: 1150 [IU]/h via INTRAVENOUS
  Administered 2021-05-04: 1400 [IU]/h via INTRAVENOUS
  Administered 2021-05-05 (×2): 1550 [IU]/h via INTRAVENOUS
  Filled 2021-05-03 (×7): qty 250

## 2021-05-03 MED ORDER — DOCUSATE SODIUM 100 MG PO CAPS: 100.0000 mg | ORAL_CAPSULE | Freq: Two times a day (BID) | ORAL | Status: DC | PRN

## 2021-05-03 MED ORDER — SODIUM CHLORIDE 0.9 % IV SOLN
2.0000 g | Freq: Two times a day (BID) | INTRAVENOUS | Status: DC
  Administered 2021-05-04 – 2021-05-05 (×3): 2 g via INTRAVENOUS
  Filled 2021-05-03 (×4): qty 2

## 2021-05-03 MED ORDER — ZINC SULFATE 220 (50 ZN) MG PO CAPS
220.0000 mg | ORAL_CAPSULE | Freq: Every day | ORAL | Status: DC
  Administered 2021-05-03 – 2021-05-05 (×3): 220 mg via ORAL
  Filled 2021-05-03 (×3): qty 1

## 2021-05-03 MED ORDER — FOLIC ACID 5 MG/ML IJ SOLN
1.0000 mg | Freq: Every day | INTRAMUSCULAR | Status: DC
  Administered 2021-05-03 – 2021-05-05 (×3): 1 mg via INTRAVENOUS
  Filled 2021-05-03 (×3): qty 0.2

## 2021-05-03 MED ORDER — METHYLPREDNISOLONE SODIUM SUCC 125 MG IJ SOLR
82.0000 mg | INTRAMUSCULAR | Status: DC
Start: 2021-05-03 — End: 2021-05-03

## 2021-05-03 MED ORDER — THIAMINE HCL 100 MG/ML IJ SOLN
100.0000 mg | Freq: Every day | INTRAMUSCULAR | Status: DC
  Administered 2021-05-03 – 2021-05-05 (×3): 100 mg via INTRAVENOUS
  Filled 2021-05-03 (×4): qty 2

## 2021-05-03 MED ORDER — ACETAMINOPHEN 325 MG PO TABS: 650.0000 mg | ORAL_TABLET | ORAL | Status: DC | PRN

## 2021-05-03 MED ORDER — PREDNISONE 20 MG PO TABS: 50.0000 mg | ORAL_TABLET | Freq: Every day | ORAL | Status: DC

## 2021-05-03 MED ORDER — VANCOMYCIN HCL 750 MG/150ML IV SOLN: 750.0000 mg | INTRAVENOUS | Status: DC

## 2021-05-03 MED ORDER — SODIUM CHLORIDE 0.9 % IV SOLN: 250.0000 mL | INTRAVENOUS | Status: DC | PRN

## 2021-05-03 MED ORDER — POLYETHYLENE GLYCOL 3350 17 G PO PACK: 17.0000 g | PACK | Freq: Every day | ORAL | Status: DC | PRN

## 2021-05-03 MED ORDER — SODIUM CHLORIDE 0.9% FLUSH
3.0000 mL | Freq: Two times a day (BID) | INTRAVENOUS | Status: DC
  Administered 2021-05-03 – 2021-05-14 (×21): 3 mL via INTRAVENOUS
  Administered 2021-05-14: 10 mL via INTRAVENOUS
  Administered 2021-05-15 – 2021-05-24 (×16): 3 mL via INTRAVENOUS

## 2021-05-03 MED ORDER — INSULIN ASPART 100 UNIT/ML IJ SOLN
0.0000 [IU] | INTRAMUSCULAR | Status: DC
  Administered 2021-05-03 – 2021-05-05 (×7): 2 [IU] via SUBCUTANEOUS
  Filled 2021-05-03 (×7): qty 1

## 2021-05-03 MED ORDER — STERILE WATER FOR INJECTION IV SOLN
INTRAVENOUS | Status: DC
  Filled 2021-05-03: qty 150
  Filled 2021-05-03: qty 1000
  Filled 2021-05-03: qty 150
  Filled 2021-05-03 (×3): qty 1000

## 2021-05-03 MED ORDER — VANCOMYCIN HCL IN DEXTROSE 1-5 GM/200ML-% IV SOLN: 1000.0000 mg | Freq: Once | INTRAVENOUS | Status: DC

## 2021-05-03 MED ORDER — METHYLPREDNISOLONE SODIUM SUCC 125 MG IJ SOLR
82.0000 mg | INTRAMUSCULAR | Status: AC
  Administered 2021-05-04 – 2021-05-05 (×2): 82 mg via INTRAVENOUS
  Filled 2021-05-03 (×2): qty 2

## 2021-05-03 MED ORDER — SODIUM CHLORIDE 0.9% FLUSH
3.0000 mL | INTRAVENOUS | Status: DC | PRN
Start: 2021-05-03 — End: 2021-05-24
  Administered 2021-05-08: 3 mL via INTRAVENOUS

## 2021-05-03 MED ORDER — ASCORBIC ACID 500 MG PO TABS
500.0000 mg | ORAL_TABLET | Freq: Every day | ORAL | Status: DC
  Administered 2021-05-03 – 2021-05-06 (×4): 500 mg via ORAL
  Filled 2021-05-03 (×5): qty 1

## 2021-05-03 MED ORDER — FAMOTIDINE IN NACL 20-0.9 MG/50ML-% IV SOLN
20.0000 mg | Freq: Two times a day (BID) | INTRAVENOUS | Status: DC
  Administered 2021-05-03 – 2021-05-06 (×6): 20 mg via INTRAVENOUS
  Filled 2021-05-03 (×6): qty 50

## 2021-05-03 MED ORDER — PREDNISONE 10 MG PO TABS: 50.0000 mg | ORAL_TABLET | Freq: Every day | ORAL | Status: DC

## 2021-05-03 MED ORDER — ALBUTEROL SULFATE HFA 108 (90 BASE) MCG/ACT IN AERS
2.0000 | INHALATION_SPRAY | Freq: Four times a day (QID) | RESPIRATORY_TRACT | Status: DC
  Administered 2021-05-04 – 2021-05-09 (×13): 2 via RESPIRATORY_TRACT
  Filled 2021-05-03 (×3): qty 6.7

## 2021-05-03 MED ORDER — CHLORHEXIDINE GLUCONATE CLOTH 2 % EX PADS
6.0000 | MEDICATED_PAD | Freq: Every day | CUTANEOUS | Status: DC
  Administered 2021-05-03 – 2021-05-23 (×18): 6 via TOPICAL

## 2021-05-03 MED ORDER — GUAIFENESIN-DM 100-10 MG/5ML PO SYRP
10.0000 mL | ORAL_SOLUTION | ORAL | Status: DC | PRN
  Administered 2021-05-03: 10 mL via ORAL
  Filled 2021-05-03: qty 10

## 2021-05-03 MED ORDER — SODIUM CHLORIDE 0.9 % IV BOLUS
500.0000 mL | Freq: Once | INTRAVENOUS | Status: AC
  Administered 2021-05-03: 500 mL via INTRAVENOUS

## 2021-05-03 MED ORDER — SODIUM CHLORIDE 0.9 % IV SOLN
2.0000 g | Freq: Once | INTRAVENOUS | Status: AC
  Administered 2021-05-03: 2 g via INTRAVENOUS
  Filled 2021-05-03: qty 2

## 2021-05-03 MED ORDER — VANCOMYCIN HCL 1750 MG/350ML IV SOLN
1750.0000 mg | Freq: Once | INTRAVENOUS | Status: AC
  Administered 2021-05-03: 1750 mg via INTRAVENOUS
  Filled 2021-05-03: qty 350

## 2021-05-03 MED ORDER — ADULT MULTIVITAMIN W/MINERALS CH
1.0000 | ORAL_TABLET | Freq: Every day | ORAL | Status: DC
  Administered 2021-05-03 – 2021-05-12 (×9): 1 via ORAL
  Filled 2021-05-03 (×10): qty 1

## 2021-05-03 MED ORDER — HEPARIN SODIUM (PORCINE) 5000 UNIT/ML IJ SOLN: 5000.0000 [IU] | Freq: Three times a day (TID) | INTRAMUSCULAR | Status: DC

## 2021-05-03 NOTE — Progress Notes (Signed)
eLink Physician-Brief Progress Note Patient Name: Jonathan Salinas DOB: 20-Nov-1935 MRN: [id number]   Date of Service  04/28/2021  HPI/Events of Note  85 yr old male admitted with multifocal pneumonia.  Symptoms since 9/29.  Positive for COVID.  WBC 21.  On high flow cannula.  On empiric abx and steroids for severe pneumonia  eICU Interventions  Chart reviewed.        Henry Russel, P 05/17/2021, 8:51 PM

## 2021-05-03 NOTE — ED Provider Notes (Signed)
[facility] Emergency Department Provider Note  ____________________________________________   None    (approximate)  I have reviewed the triage vital signs    HISTORY  Chief Complaint Shortness of Breath    HPI Jonathan Salinas is a 85 y.o. male with GERD, hypertension comes in with shortness of breath.  Patient reported the symptoms started on 9/29.  He was seen and 10/3 and had a positive COVID test then.  But but at that point was out of the window for further treatments.  Was previously on doxycycline for possible bronchitis.  Patient reports being unvaccinated.  He comes in today for worsening shortness of breath with exertion, severe, nothing makes it better.  Reports a very remote smoking history but very minimal in nature.  Denies history of COPD or other lung problems.  Denies any falls, hitting his head, bleeding issues.           Past Medical History:  Diagnosis Date   GERD (gastroesophageal reflux disease)    Hypertension     Patient Active Problem List   Diagnosis Date Noted   Essential hypertension 06/25/2018    Past Surgical History:  Procedure Laterality Date   COLONOSCOPY  2010   cleared for 10 yrs   INNER EAR SURGERY     had a perforated eardrum   TONSILLECTOMY      Prior to Admission medications   Medication Sig Start Date End Date Taking? Authorizing Provider  doxycycline (VIBRAMYCIN) 100 MG capsule Take 1 capsule (100 mg total) by mouth 2 (two) times daily. 04/26/21   Tommie Sams, DO  lisinopril (ZESTRIL) 10 MG tablet TAKE 1 TABLET BY MOUTH EVERY DAY 01/18/20   [name], MD    Allergies Penicillins  Family History  Problem Relation Age of Onset   Cancer Mother    Cancer Father    Prostate cancer Neg Hx    Bladder Cancer Neg Hx    Kidney cancer Neg Hx     Social History Social History   Tobacco Use   Smoking status: Former   Smokeless tobacco: Never  Building services engineer Use: Never used   Substance Use Topics   Alcohol use: No    Alcohol/week: 0.0 standard drinks   Drug use: No      Review of Systems Constitutional: No fever/chills Eyes: No visual changes. ENT: No sore throat. Cardiovascular: Denies chest pain. Respiratory: + SOB Gastrointestinal: No abdominal pain.  No nausea, no vomiting.  No diarrhea.  No constipation. Genitourinary: Negative for dysuria. Musculoskeletal: Negative for back pain. Skin: Negative for rash. Neurological: Negative for headaches, focal weakness or numbness. All other ROS negative ____________________________________________   PHYSICAL EXAM:  VITAL SIGNS: ED Triage Vitals  Enc Vitals Group     BP 05/13/2021 1717 (!) 150/87     Pulse Rate 04/27/2021 1717 (!) 132     Resp 05/13/2021 1717 (!) 35     Temp --      Temp src --      SpO2 05/19/2021 1717 (!) 81 %     Weight 05/12/2021 1718 177 lb 14.6 oz (80.7 kg)     Height 05/08/2021 1718 6\' 1"  (1.854 m)     Head Circumference --      Peak Flow --      Pain Score 05/08/2021 1721 0     Pain Loc --      Pain Edu? --      Excl. in  GC? --     Constitutional: Alert but critically ill Eyes: Conjunctivae are normal. EOMI. Head: Atraumatic. Nose: No congestion/rhinnorhea. Mouth/Throat: Mucous membranes are moist.   Neck: No stridor. Trachea Midline. FROM Cardiovascular: Tachycardic, regular rhythm.  Good peripheral circulation. Respiratory: no audible stridor, work of breathing, no wheezing Gastrointestinal: Soft and nontender. No distention. Musculoskeletal: No lower extremity tenderness nor edema.  No joint effusions. Neurologic:  Normal speech and language. No gross focal neurologic deficits are appreciated.  Skin:  Skin is warm, dry and intact. No rash noted. Psychiatric: Mood and affect are normal. Speech and behavior are normal. GU: Deferred   ____________________________________________   LABS (all labs ordered are listed, but only abnormal results are displayed)  Labs  Reviewed  CULTURE, BLOOD (ROUTINE X 2)  CULTURE, BLOOD (ROUTINE X 2)  LACTIC ACID, PLASMA  LACTIC ACID, PLASMA  CBC WITH DIFFERENTIAL/PLATELET  COMPREHENSIVE METABOLIC PANEL  PROCALCITONIN  LACTATE DEHYDROGENASE  FERRITIN  TRIGLYCERIDES  FIBRINOGEN  C-REACTIVE PROTEIN  D-DIMER, QUANTITATIVE  TSH  T4, FREE  BRAIN NATRIURETIC PEPTIDE  TROPONIN I (HIGH SENSITIVITY)   ____________________________________________   ED ECG REPORT I, Concha Se, the attending physician, personally viewed and interpreted this ECG.  Sinus tachycardia rate of 129, no ST elevation, T wave inversions in lead III and V3 and V6, right bundle branch block.  Reviewed patient's prior EKGs and the ST elevation in lead III looks similar to prior as well as aVR.  He does have new T wave inversions however in lead III and V3 ____________________________________________  RADIOLOGY I, Concha Se, personally viewed and evaluated these images (plain radiographs) as part of my medical decision making, as well as reviewing the written report by the radiologist.  ED MD interpretation: Patchiness bilaterally concerning for multifocal pneumonia  Official radiology report(s): DG Chest Port 1 View  Result Date: 05/31/21 CLINICAL DATA:  Shortness of breath, weakness, COVID EXAM: PORTABLE CHEST 1 VIEW COMPARISON:  04/26/2021 FINDINGS: Patchy bilateral airspace disease, new since prior study compatible with multifocal pneumonia. Heart is normal size. No visible effusions or pneumothorax. Aortic atherosclerosis. No acute bony abnormality. IMPRESSION: New extensive patchy bilateral airspace disease compatible with multifocal pneumonia. Electronically Signed   By: Charlett Nose M.D.   On: 05/31/21 17:45    ____________________________________________   PROCEDURES  Procedure(s) performed (including Critical Care):  .1-3 Lead EKG Interpretation Performed by: Concha Se, MD Authorized by: Concha Se, MD      Interpretation: abnormal     ECG rate:  120s   ECG rate assessment: tachycardic     Rhythm: sinus tachycardia     Ectopy: none     Conduction: normal   .Critical Care Performed by: Concha Se, MD Authorized by: Concha Se, MD   Critical care provider statement:    Critical care time (minutes):  40   Critical care was necessary to treat or prevent imminent or life-threatening deterioration of the following conditions:  Respiratory failure   Critical care was time spent personally by me on the following activities:  Blood draw for specimens, discussions with consultants, examination of patient, development of treatment plan with patient or surrogate, obtaining history from patient or surrogate, review of old charts, re-evaluation of patient's condition, pulse oximetry, ordering and review of radiographic studies, ordering and review of laboratory studies and ordering and performing treatments and interventions   I assumed direction of critical care for this patient from another provider in my specialty: yes  Care discussed with: admitting provider     ____________________________________________   INITIAL IMPRESSION / ASSESSMENT AND PLAN / ED COURSE  RYLYN RANGANATHAN was evaluated in Emergency Department on 05/06/2021 for the symptoms described in the history of present illness. He was evaluated in the context of the global COVID-19 pandemic, which necessitated consideration that the patient might be at risk for infection with the SARS-CoV-2 virus that causes COVID-19. Institutional protocols and algorithms that pertain to the evaluation of patients at risk for COVID-19 are in a state of rapid change based on information released by regulatory bodies including the CDC and federal and state organizations. These policies and algorithms were followed during the patient's care in the ED.     Pt presents with SOB.  Patient is known COVID-positive.  Patient is day 12 symptoms of concern  for the potential of PE.    Will obtain testing and get xray to evaluate for PNA, EKG/trop to evaluate for ACS. No H/o Heart Failure.   Will get labs to evaluate for dehydration and electrolyte abnormalities.  Will continue to closely monitor patient during workup and keep on cardiac monitor. Will continue oxygen support as needed.   5:27 PM patient currently quiring max support of high flow nasal cannula  Patient's BNP is not elevated so we will trial little bit of fluid to see if that will help with his heart rates.  His chest x-ray does show diffuse infiltrates concerning for pneumonia.  We will do a sepsis alert and start on broad-spectrum antibiotics.  Patient does not know his allergy to penicillin but states he thinks it was just mild so we will do some ceftriaxone and vancomycin.  I discussed the case with Dr. Belia Heman from ICU.  They will admit patient.        ____________________________________________   FINAL CLINICAL IMPRESSION(S) / ED DIAGNOSES   Final diagnoses:  COVID-19  Pneumonia of both lungs due to infectious organism, unspecified part of lung  Sepsis, due to unspecified organism, unspecified whether acute organ dysfunction present (HCC)      MEDICATIONS GIVEN DURING THIS VISIT:  Medications  sodium chloride flush (NS) 0.9 % injection 3 mL (has no administration in time range)  sodium chloride flush (NS) 0.9 % injection 3 mL (has no administration in time range)  0.9 %  sodium chloride infusion (has no administration in time range)  acetaminophen (TYLENOL) tablet 650 mg (has no administration in time range)  docusate sodium (COLACE) capsule 100 mg (has no administration in time range)  polyethylene glycol (MIRALAX / GLYCOLAX) packet 17 g (has no administration in time range)  ondansetron (ZOFRAN) injection 4 mg (has no administration in time range)  famotidine (PEPCID) IVPB 20 mg premix (has no administration in time range)  heparin injection 5,000 Units (has  no administration in time range)  vancomycin (VANCOCIN) IVPB 1000 mg/200 mL premix (has no administration in time range)  ceFEPIme (MAXIPIME) 2 g in sodium chloride 0.9 % 100 mL IVPB (has no administration in time range)  sodium chloride 0.9 % bolus 500 mL (has no administration in time range)     ED Discharge Orders     None        Note:  This document was prepared using Dragon voice recognition software and may include unintentional dictation errors.   Concha Se, MD 05/04/2021 [id number]

## 2021-05-03 NOTE — Telephone Encounter (Signed)
Patient was given doxycyline at ED and every time pt takes this medication he vomits. Recommended stopping tis medication. Pt has appointment to see Dr. Yetta Barre this afternoon.  Reason for Disposition  Vomiting a prescription medication  Answer Assessment - Initial Assessment Questions 1. VOMITING SEVERITY: "How many times have you vomited in the past 24 hours?"     - MILD:  1 - 2 times/day    - MODERATE: 3 - 5 times/day, decreased oral intake without significant weight loss or symptoms of dehydration    - SEVERE: 6 or more times/day, vomits everything or nearly everything, with significant weight loss, symptoms of dehydration      1-2 2. ONSET: "When did the vomiting begin?"      After taking new medication 3. FLUIDS: "What fluids or food have you vomited up today?" "Have you been able to keep any fluids down?"     na 4. ABDOMINAL PAIN: "Are your having any abdominal pain?" If yes : "How bad is it and what does it feel like?" (e.g., crampy, dull, intermittent, constant)      no 5. DIARRHEA: "Is there any diarrhea?" If Yes, ask: "How many times today?"      no 6. CONTACTS: "Is there anyone else in the family with the same symptoms?"      no 7. CAUSE: "What do you think is causing your vomiting?"     Medication 8. HYDRATION STATUS: "Any signs of dehydration?" (e.g., dry mouth [not only dry lips], too weak to stand) "When did you last urinate?"     no 9. OTHER SYMPTOMS: "Do you have any other symptoms?" (e.g., fever, headache, vertigo, vomiting blood or coffee grounds, recent head injury)     no 10. PREGNANCY: "Is there any chance you are pregnant?" "When was your last menstrual period?"       na  Protocols used: Vomiting-A-AH

## 2021-05-03 NOTE — Consult Note (Signed)
ANTICOAGULATION CONSULT NOTE - Follow Up Consult  Pharmacy Consult for heparin drip Indication:  Elevated D-dimer and troponin in COVID-19 positive pt  Allergies  Allergen Reactions   Penicillins     Patient Measurements: Height: 6\' 1"  (185.4 cm) Weight: 71.2 kg (156 lb 15.5 oz) IBW/kg (Calculated) : 79.9 Heparin Dosing Weight: 71.2kg  Vital Signs: Temp: 98.1 F (36.7 C) (10/10 1900) Temp Source: Oral (10/10 1900) BP: 155/95 (10/10 1900) Pulse Rate: 120 (10/10 1910)  Labs: Recent Labs    2021/05/30 1719 2021-05-30 1746  HGB 14.1  --   HCT 41.3  --   PLT 315  --   CREATININE 1.59* 1.52*  TROPONINIHS 224*  --     Estimated Creatinine Clearance: 37.1 mL/min (A) (by C-G formula based on SCr of 1.52 mg/dL (H)).   Medications:  No PTA anticoagulation of note  Assessment: 85 yo male admitted with acute hypoxic respiratory failure, COVID+ PNA, and elevated D-Dimer.  Goal of Therapy:  Heparin level 0.3-0.7 units/ml Monitor platelets by anticoagulation protocol: Yes   Plan:  Give 4000 units bolus x 1 Start heparin infusion at 1150 units/hr Check anti-Xa level in 8 hours and daily while on heparin Continue to monitor H&H and platelets  91, PharmD, BCPS Clinical Pharmacist 05/30/2021 7:18 PM

## 2021-05-03 NOTE — H&P (Signed)
NAME:  Jonathan Salinas, MRN:  [id number], DOB:  Apr 12, 1936, LOS: 0 ADMISSION DATE:  2021/05/06, CONSULTATION DATE: 06-May-2021 REFERRING MD: Dr. Fuller Plan, CHIEF COMPLAINT: Shortness of Breath    History of Present Illness:  This is an 85 yo male who presented to [facility] ER on 10/10 via EMS with c/o shortness of breath onset 09/29.Marland Kitchen  He was evaluated at [facility] on 10/3, and initially treated with doxycycline 100 mg bid for suspected bronchitis pending COVID-19 test results.  Pt is unvaccinated.  His COVID-19 results came back positive, however he was out of the window for antiviral treatment, and provider recommended supportive care.  On 10/10 pt reported each time he took the doxycycline as prescribed he vomited and he continued to have worsening shortness of breath, therefore EMS notified. EMS reported upon their arrival at pts home his O2 sats were 68% on RA.  EMS placed pt on NRB @15L , pt received  solumedrol and duonebs with O2 sats increasing to the 80's.  ED Course: Upon arrival to the ER pt remained hypoxic with O2 sats in the low to mid 80's requiring transition to 100% HHFNC @60  L/min.  CXR concerning for multifocal pneumonia.  Lab results revealed wbc 21.0 and fibrinogen 535.  Pt received cefepime and vancomycin.  PCCM team contacted for ICU admission.    Pertinent  Medical History  GERD  HTN  Significant Hospital Events: Including procedures, antibiotic start and stop dates in addition to other pertinent events   10/10: Pt admitted to ICU with acute hypoxic respiratory failure secondary to COVID-19 and multifocal pneumonia   Micro Data:   MRSA PCR 10/10>> Blood x2 10/10>>  Antimicrobial:  Vancomycin 10/10>> Cefepime 10/10>>  Objective   Blood pressure (!) 150/87, pulse (!) 125, temperature 97.7 F (36.5 C), temperature source Oral, resp. rate (!) 36, height 6\' 1"  (1.854 m), weight 80.7 kg, SpO2 91 %.    FiO2 (%):  [100 %] 100 %  No intake or output data in the 24  hours ending May 06, 2021 1757 Filed Weights   06-May-2021 1718  Weight: 80.7 kg    PHYSICAL EXAMINATION:  GENERAL:critically ill appearing, +resp distress EYES: Pupils equal, round, reactive to light.  No scleral icterus.  MOUTH: Moist mucosal membrane. NECK: Supple.  PULMONARY: +rhonchi, +wheezing CARDIOVASCULAR: S1 and S2.  No murmurs  GASTROINTESTINAL: Soft, nontender, -distended. Positive bowel sounds.  MUSCULOSKELETAL: No swelling, clubbing, or edema.  NEUROLOGIC: alert and awake SKIN:intact,warm,dry    Resolved Hospital Problem list     Assessment & Plan:   Acute hypoxic respiratory failure secondary to COVID-19 and multifocal pneumonia  - Supplemental O2 for dyspnea and/or hypoxia  - Maintain O2 sats 88% and above  - If pt agreeable will start Remdesivir pending liver enzyme levels, plan for 5 day course - IV steroids  - prn bronchodilator therapy  - Follow inflammatory markers: Ferritin, D-dimer, CRP, IL-6, LDH - Vitamin C, zinc - prn antitussives  - Check resp cultures - Aggressive pulmonary hygiene  - Echo pending  - Maintain airborne and contact precautions - HIGH RISK FOR MECHANICAL INTUBATION  Start HEPARIN INFUSION  HTN-stable - Continuous telemetry monitoring  - Trend troponin's; if elevated will start heparin gtt   Acute renal failure Lactic acidosis  - Trend BMP and lactic acid  - Replace electrolytes as indicated - Monitor UOP - Avoid nephrotoxic medications  - Sodium bicarb gtt @75  ml/hr   GERD - IV pepcid   Best Practice (right click and "Reselect  all SmartList Selections" daily)   Diet/type: NPO DVT prophylaxis: prophylactic heparin  GI prophylaxis: H2B Lines: N/A Foley:  N/A Code Status:  full code Last date of multidisciplinary goals of care discussion [N/A]  Labs   CBC: Recent Labs  Lab 05/01/2021 1719  WBC 21.0*  NEUTROABS 18.1*  HGB 14.1  HCT 41.3  MCV 91.8  PLT 315    Basic Metabolic Panel: No results for input(s):  NA, K, CL, CO2, GLUCOSE, BUN, CREATININE, CALCIUM, MG, PHOS in the last 168 hours. GFR: CrCl cannot be calculated (Patient's most recent lab result is older than the maximum 21 days allowed.). Recent Labs  Lab 04/26/2021 1719  WBC 21.0*    Liver Function Tests: No results for input(s): AST, ALT, ALKPHOS, BILITOT, PROT, ALBUMIN in the last 168 hours. No results for input(s): LIPASE, AMYLASE in the last 168 hours. No results for input(s): AMMONIA in the last 168 hours.  ABG No results found for: PHART, PCO2ART, PO2ART, HCO3, TCO2, ACIDBASEDEF, O2SAT   Coagulation Profile: No results for input(s): INR, PROTIME in the last 168 hours.  Cardiac Enzymes: No results for input(s): CKTOTAL, CKMB, CKMBINDEX, TROPONINI in the last 168 hours.  HbA1C: No results found for: HGBA1C  CBG: No results for input(s): GLUCAP in the last 168 hours.  Review of Systems: Positives in BOLD   Gen: Denies fever, chills, weight change, fatigue, night sweats HEENT: Denies blurred vision, double vision, hearing loss, tinnitus, sinus congestion, rhinorrhea, sore throat, neck stiffness, dysphagia PULM: shortness of breath, cough, sputum production, hemoptysis, wheezing CV: Denies chest pain, edema, orthopnea, paroxysmal nocturnal dyspnea, palpitations GI: abdominal pain, nausea, vomiting, diarrhea, hematochezia, melena, constipation, change in bowel habits GU: Denies dysuria, hematuria, polyuria, oliguria, urethral discharge Endocrine: Denies hot or cold intolerance, polyuria, polyphagia or appetite change Derm: Denies rash, dry skin, scaling or peeling skin change Heme: Denies easy bruising, bleeding, bleeding gums Neuro: Denies headache, numbness, weakness, slurred speech, loss of memory or consciousness  Past Medical History:  He,  has a past medical history of GERD (gastroesophageal reflux disease) and Hypertension.   Surgical History:   Past Surgical History:  Procedure Laterality Date   COLONOSCOPY   2010   cleared for 10 yrs   INNER EAR SURGERY     had a perforated eardrum   TONSILLECTOMY       Social History:   reports that he has quit smoking. He has never used smokeless tobacco. He reports that he does not drink alcohol and does not use drugs.   Family History:  His family history includes Cancer in his father and mother. There is no history of Prostate cancer, Bladder Cancer, or Kidney cancer.   Allergies Allergies  Allergen Reactions   Penicillins      Home Medications  Prior to Admission medications   Medication Sig Start Date End Date Taking? Authorizing Provider  doxycycline (VIBRAMYCIN) 100 MG capsule Take 1 capsule (100 mg total) by mouth 2 (two) times daily. 04/26/21   Tommie Sams, DO  lisinopril (ZESTRIL) 10 MG tablet TAKE 1 TABLET BY MOUTH EVERY DAY 01/18/20   [name], MD       DVT/GI PRX  assessed I Assessed the need for Labs I Assessed the need for Foley I Assessed the need for Central Venous Line Family Discussion when available I Assessed the need for Mobilization I made an Assessment of medications to be adjusted accordingly Safety Risk assessment completed  CASE DISCUSSED IN MULTIDISCIPLINARY ROUNDS WITH ICU TEAM  Critical Care Time devoted to patient care services described in this note is 65 minutes.  Critical care was necessary to treat /prevent imminent and life-threatening deterioration. Overall, patient is critically ill, prognosis is guarded.  Patient with Multiorgan failure and at high risk for cardiac arrest and death.    Lucie Leather, M.D.  [facility]  Medical Director [facility] Medical Director [facility] Cardio-Pulmonary Department

## 2021-05-03 NOTE — Sepsis Progress Note (Signed)
ELink monitoring code sepsis 

## 2021-05-03 NOTE — ED Triage Notes (Signed)
Pt arrives via [facility] with CC of shortness of breath. COVID+ on 05/02/21. Reports feeling fatigued x1 week. [facility] VS: 68% on room air - 80% on NRB at 15L Given solumedrol and neb

## 2021-05-03 NOTE — Consult Note (Addendum)
PHARMACY -  BRIEF ANTIBIOTIC NOTE   Pharmacy has received consult(s) for cefepime and vancomycin from an ED provider.  The patient's profile has been reviewed for ht/wt/allergies/indication/available labs.    One time order(s) placed for  Cefepime 2 gram  Vancomycin 1750 mg   Further antibiotics/pharmacy consults should be ordered by admitting physician if indicated.                       Thank you, Sharen Hones, PharmD, BCPS Clinical Pharmacist   04/25/2021  6:09 PM

## 2021-05-03 NOTE — Consult Note (Signed)
CODE SEPSIS - PHARMACY COMMUNICATION  **Broad Spectrum Antibiotics should be administered within 1 hour of Sepsis diagnosis**  Time Code Sepsis Called/Page Received: 1806  Antibiotics Ordered: 1806  Time of 1st antibiotic administration: 1924   Additional action taken by pharmacy: noticed patient was transferred to ICU before sepsis abx given. Called and relayed info to ICU nurse and tubed new meds to ICU  If necessary, Name of Provider/Nurse Contacted: Lowella Fairy, RN    Sharen Hones ,PharmD, BCPS Clinical Pharmacist  05-15-21  6:09 PM

## 2021-05-03 NOTE — Progress Notes (Signed)
Following for sepsis monitoring. Blood cultures have been collected and lactic acid has cleared.  VSS. Minimal fluid resuscitation d/t +Covid status. Will close sepsis and follow as ICU patient.

## 2021-05-03 NOTE — Consult Note (Signed)
Pharmacy Antibiotic Note  Jonathan Salinas is a 85 y.o. male admitted on 05/02/2021 with pneumonia/Sepsis  Pharmacy has been consulted for Vancomycin and cefepime dosing.  Plan: Cefepime 2 gram Q12H based on current renal function Vancomycin 1750 mg x 1 LD given in  Initiate Vancomycin 750 mg Q24H. Goal AUC 400-550 Estimated AUC 415/Cmin: 11.4 Scr 1.52, TBW, Vd 0.752  MRSA PCR pending   Height: 6\' 1"  (185.4 cm) Weight: 71.2 kg (156 lb 15.5 oz) IBW/kg (Calculated) : 79.9  Temp (24hrs), Avg:97.9 F (36.6 C), Min:97.7 F (36.5 C), Max:98.1 F (36.7 C)  Recent Labs  Lab 05/07/2021 1719 05/13/2021 1746 05/11/2021 1923  WBC 21.0*  --   --   CREATININE 1.59* 1.52*  --   LATICACIDVEN  --  6.2* 2.8*    Estimated Creatinine Clearance: 37.1 mL/min (A) (by C-G formula based on SCr of 1.52 mg/dL (H)).    Allergies  Allergen Reactions   Penicillins     Antimicrobials this admission: 10/10 cefepime >>  10/10 vancomycin >>   Dose adjustments this admission: N/a  Microbiology results: 10/10 BCx: sent 10/10 Sputum: sent  10/10 MRSA PCR: sent  Thank you for allowing pharmacy to be a part of this patient's care.  07/03/21, PharmD, BCPS Clinical Pharmacist   04/24/2021 8:03 PM

## 2021-05-04 ENCOUNTER — Inpatient Hospital Stay (HOSPITAL_COMMUNITY)
Admit: 2021-05-04 | Discharge: 2021-05-04 | Disposition: A | Discharge status: Home / Self Care | Payer: Medicare Other | Attending: Critical Care Medicine | Admitting: Critical Care Medicine

## 2021-05-04 ENCOUNTER — Inpatient Hospital Stay
Admit: 2021-05-04 | Discharge: 2021-05-04 | Disposition: A | Discharge status: Home / Self Care | Payer: Medicare Other | Attending: Critical Care Medicine | Admitting: Critical Care Medicine

## 2021-05-04 DIAGNOSIS — R0603 Acute respiratory distress: Secondary | ICD-10-CM | POA: Diagnosis not present

## 2021-05-04 DIAGNOSIS — U071 COVID-19: Secondary | ICD-10-CM | POA: Diagnosis not present

## 2021-05-04 LAB — MAGNESIUM: Magnesium: 2.2 mg/dL (ref 1.7–2.4)

## 2021-05-04 LAB — GLUCOSE, CAPILLARY
Glucose-Capillary: 138 mg/dL — ABNORMAL HIGH (ref 70–99)
Glucose-Capillary: 122 mg/dL — ABNORMAL HIGH (ref 70–99)
Glucose-Capillary: 120 mg/dL — ABNORMAL HIGH (ref 70–99)
Glucose-Capillary: 147 mg/dL — ABNORMAL HIGH (ref 70–99)
Glucose-Capillary: 133 mg/dL — ABNORMAL HIGH (ref 70–99)
Glucose-Capillary: 118 mg/dL — ABNORMAL HIGH (ref 70–99)

## 2021-05-04 LAB — CBC WITH DIFFERENTIAL/PLATELET
Abs Immature Granulocytes: 0.29 10*3/uL — ABNORMAL HIGH (ref 0.00–0.07)
Basophils Absolute: 0 10*3/uL (ref 0.0–0.1)
Basophils Relative: 0 %
Eosinophils Absolute: 0 10*3/uL (ref 0.0–0.5)
Eosinophils Relative: 0 %
HCT: 34 % — ABNORMAL LOW (ref 39.0–52.0)
Hemoglobin: 12.2 g/dL — ABNORMAL LOW (ref 13.0–17.0)
Immature Granulocytes: 2 %
Lymphocytes Relative: 4 %
Lymphs Abs: 0.6 10*3/uL — ABNORMAL LOW (ref 0.7–4.0)
MCH: 32.4 pg (ref 26.0–34.0)
MCHC: 35.9 g/dL (ref 30.0–36.0)
MCV: 90.2 fL (ref 80.0–100.0)
Monocytes Absolute: 0.4 10*3/uL (ref 0.1–1.0)
Monocytes Relative: 3 %
Neutro Abs: 13.4 10*3/uL — ABNORMAL HIGH (ref 1.7–7.7)
Neutrophils Relative %: 91 %
Platelets: 254 10*3/uL (ref 150–400)
RBC: 3.77 MIL/uL — ABNORMAL LOW (ref 4.22–5.81)
RDW: 13.2 % (ref 11.5–15.5)
WBC: 14.8 10*3/uL — ABNORMAL HIGH (ref 4.0–10.5)
nRBC: 0 % (ref 0.0–0.2)

## 2021-05-04 LAB — ECHOCARDIOGRAM COMPLETE
AR max vel: 3.13 cm2
AV Area VTI: 3.19 cm2
AV Area mean vel: 2.87 cm2
AV Mean grad: 2 mmHg
AV Peak grad: 3.4 mmHg
Ao pk vel: 0.92 m/s
Area-P 1/2: 6.02 cm2
Height: 73 in
S' Lateral: 2.7 cm
Weight: 2596.14 oz

## 2021-05-04 LAB — BLOOD GAS, ARTERIAL
Acid-base deficit: 0.1 mmol/L (ref 0.0–2.0)
Bicarbonate: 22.1 mmol/L (ref 20.0–28.0)
FIO2: 80
O2 Saturation: 95.4 %
Patient temperature: 37
pCO2 arterial: 29 mmHg — ABNORMAL LOW (ref 32.0–48.0)
pH, Arterial: 7.49 — ABNORMAL HIGH (ref 7.350–7.450)
pO2, Arterial: 71 mmHg — ABNORMAL LOW (ref 83.0–108.0)

## 2021-05-04 LAB — COMPREHENSIVE METABOLIC PANEL
ALT: 23 U/L (ref 0–44)
AST: 27 U/L (ref 15–41)
Albumin: 1.9 g/dL — ABNORMAL LOW (ref 3.5–5.0)
Alkaline Phosphatase: 57 U/L (ref 38–126)
Anion gap: 10 (ref 5–15)
BUN: 35 mg/dL — ABNORMAL HIGH (ref 8–23)
CO2: 23 mmol/L (ref 22–32)
Calcium: 8 mg/dL — ABNORMAL LOW (ref 8.9–10.3)
Chloride: 103 mmol/L (ref 98–111)
Creatinine, Ser: 1.27 mg/dL — ABNORMAL HIGH (ref 0.61–1.24)
GFR, Estimated: 56 mL/min — ABNORMAL LOW (ref >60)
Glucose, Bld: 129 mg/dL — ABNORMAL HIGH (ref 70–99)
Potassium: 3.8 mmol/L (ref 3.5–5.1)
Sodium: 136 mmol/L (ref 135–145)
Total Bilirubin: 1 mg/dL (ref 0.3–1.2)
Total Protein: 6.2 g/dL — ABNORMAL LOW (ref 6.5–8.1)

## 2021-05-04 LAB — FERRITIN: Ferritin: 621 ng/mL — ABNORMAL HIGH (ref 24–336)

## 2021-05-04 LAB — LACTIC ACID, PLASMA: Lactic Acid, Venous: 1.5 mmol/L (ref 0.5–1.9)

## 2021-05-04 LAB — HEMOGLOBIN A1C
Hgb A1c MFr Bld: 5.5 % (ref 4.8–5.6)
Mean Plasma Glucose: 111 mg/dL

## 2021-05-04 LAB — HEPARIN LEVEL (UNFRACTIONATED)
Heparin Unfractionated: 0.14 IU/mL — ABNORMAL LOW (ref 0.30–0.70)
Heparin Unfractionated: 0.4 IU/mL (ref 0.30–0.70)

## 2021-05-04 LAB — C-REACTIVE PROTEIN: CRP: 25.7 mg/dL — ABNORMAL HIGH (ref <1.0)

## 2021-05-04 LAB — PHOSPHORUS: Phosphorus: 4 mg/dL (ref 2.5–4.6)

## 2021-05-04 LAB — D-DIMER, QUANTITATIVE: D-Dimer, Quant: >20 ug/mL-FEU — ABNORMAL HIGH (ref 0.00–0.50)

## 2021-05-04 MED ORDER — VANCOMYCIN HCL IN DEXTROSE 1-5 GM/200ML-% IV SOLN
1000.0000 mg | INTRAVENOUS | Status: DC
  Filled 2021-05-04: qty 200

## 2021-05-04 MED ORDER — HEPARIN BOLUS VIA INFUSION
2400.0000 [IU] | Freq: Once | INTRAVENOUS | Status: AC
  Administered 2021-05-04: 2400 [IU] via INTRAVENOUS
  Filled 2021-05-04: qty 2400

## 2021-05-04 MED ORDER — BOOST / RESOURCE BREEZE PO LIQD CUSTOM
1.0000 | Freq: Three times a day (TID) | ORAL | Status: DC
  Administered 2021-05-04 – 2021-05-05 (×4): 1 via ORAL

## 2021-05-04 NOTE — Consult Note (Signed)
ANTICOAGULATION CONSULT NOTE - Follow Up Consult  Pharmacy Consult for heparin drip Indication:  Elevated D-dimer and troponin in COVID-19 positive pt  Allergies  Allergen Reactions   Penicillins     Patient Measurements: Height: 6\' 1"  (185.4 cm) Weight: 73.6 kg (162 lb 4.1 oz) IBW/kg (Calculated) : 79.9 Heparin Dosing Weight: 71.2kg  Vital Signs: Temp: 97.4 F (36.3 C) (10/11 1200) Temp Source: Oral (10/11 1200) BP: 146/112 (10/11 1500) Pulse Rate: 128 (10/11 1500)  Labs: Recent Labs    05/31/21 1719 05-31-2021 1746 31-May-2021 1923 2021/05/31 1940 05/04/21 0605 05/04/21 1547  HGB 14.1  --   --   --  12.2*  --   HCT 41.3  --   --   --  34.0*  --   PLT 315  --   --   --  254  --   APTT  --   --   --  31  --   --   LABPROT  --   --   --  17.7*  --   --   INR  --   --   --  1.5*  --   --   HEPARINUNFRC  --   --   --   --  0.14* 0.40  CREATININE 1.59* 1.52*  --   --  1.27*  --   TROPONINIHS 224*  --  223*  --   --   --     Estimated Creatinine Clearance: 45.9 mL/min (A) (by C-G formula based on SCr of 1.27 mg/dL (H)).   Medications:  No PTA anticoagulation of note Heparin Dosing Weight: 71.2kg  Assessment: 85 yo male admitted with acute hypoxic respiratory failure, COVID+ PNA, and elevated D-Dimer.  10/10: CT Chest: multifocal PNA, no evidence of PE noted.  Date Time HL Rate/Comment 10/11 0605 0.14 Subthera; 1150 > 1400 un/hr 10/11 1547 0.40 Thera x1, keep 1400un/hr      Baseline Labs: aPTT - 31s INR - 1.5 Hgb - 14.1>12.2 Plts - 315>254 D-dimer >20; fibrinogen 535; LDH 395; trop 223  Goal of Therapy:  Heparin level 0.3-0.7 units/ml Monitor platelets by anticoagulation protocol: Yes   Plan: Heparin therapeutic, will keep infusion at 1400 units/hr Check anti-Xa level in 8 hours to confirm therapeutic and daily while on heparin Continue to monitor H&H and platelets  0606, PharmD Pharmacy Resident  05/04/2021 4:54 PM

## 2021-05-04 NOTE — Consult Note (Signed)
Pharmacy Antibiotic Note  Jonathan Salinas is a 85 y.o. male admitted on 2021/05/10 with pneumonia/Sepsis  Pharmacy has been consulted for Vancomycin and cefepime dosing.  Plan: Continue Cefepime 2 gram Q12H based on current renal function MRSA PCR negative --> D/C'd Vancomycin   Height: 6\' 1"  (185.4 cm) Weight: 73.6 kg (162 lb 4.1 oz) IBW/kg (Calculated) : 79.9  Temp (24hrs), Avg:97.8 F (36.6 C), Min:97.6 F (36.4 C), Max:98.1 F (36.7 C)  Recent Labs  Lab 2021-05-10 1719 05-10-2021 1746 2021/05/10 1923 05/04/21 0605  WBC 21.0*  --   --  14.8*  CREATININE 1.59* 1.52*  --  1.27*  LATICACIDVEN  --  6.2* 2.8* 1.5     Estimated Creatinine Clearance: 45.9 mL/min (A) (by C-G formula based on SCr of 1.27 mg/dL (H)).    Allergies  Allergen Reactions   Penicillins     Antimicrobials this admission: 10/10 cefepime >>  10/10 vancomycin >> 10/11  Dose adjustments this admission: N/a  Microbiology results: 10/10 BCx: NGTD 10/10 Sputum: Pending  10/10 MRSA PCR: Negative  Thank you for allowing pharmacy to be a part of this patient's care.  07/04/21, PharmD, [facility] Clinical Pharmacist 05/04/2021 7:24 AM

## 2021-05-04 NOTE — Consult Note (Signed)
ANTICOAGULATION CONSULT NOTE - Follow Up Consult  Pharmacy Consult for heparin drip Indication:  Elevated D-dimer and troponin in COVID-19 positive pt  Allergies  Allergen Reactions   Penicillins     Patient Measurements: Height: 6\' 1"  (185.4 cm) Weight: 73.6 kg (162 lb 4.1 oz) IBW/kg (Calculated) : 79.9 Heparin Dosing Weight: 71.2kg  Vital Signs: Temp: 97.6 F (36.4 C) (10/11 0441) Temp Source: Oral (10/11 0441) BP: 149/93 (10/11 0600) Pulse Rate: 97 (10/11 0600)  Labs: Recent Labs    05/22/2021 1719 05/05/2021 1746 05/19/2021 1923 04/27/2021 1940 05/04/21 0605  HGB 14.1  --   --   --  12.2*  HCT 41.3  --   --   --  34.0*  PLT 315  --   --   --  254  APTT  --   --   --  31  --   LABPROT  --   --   --  17.7*  --   INR  --   --   --  1.5*  --   HEPARINUNFRC  --   --   --   --  0.14*  CREATININE 1.59* 1.52*  --   --  1.27*  TROPONINIHS 224*  --  223*  --   --     Estimated Creatinine Clearance: 45.9 mL/min (A) (by C-G formula based on SCr of 1.27 mg/dL (H)).   Medications:  No PTA anticoagulation of note Heparin Dosing Weight: 71.2kg  Assessment: 85 yo male admitted with acute hypoxic respiratory failure, COVID+ PNA, and elevated D-Dimer.  10/10: CT Chest: multifocal PNA, no evidence of PE noted.  Date Time HL Rate/Comment 10/11 0605 0.14 Subthera; 1150 > 1400 un/hr      Baseline Labs: aPTT - 31s INR - 1.5 Hgb - 14.1>12.2 Plts - 315>254 D-dimer >20; fibrinogen 535; LDH 395; trop 223  Goal of Therapy:  Heparin level 0.3-0.7 units/ml Monitor platelets by anticoagulation protocol: Yes   Plan: Subthera; HL 0.14  Give 2100 units bolus x1; then increase heparin infusion to 1400 units/hr Check anti-Xa level in 8 hours and daily while on heparin Continue to monitor H&H and platelets  0606, PharmD, [facility] Clinical Pharmacist 05/04/2021 7:25 AM

## 2021-05-04 NOTE — Progress Notes (Signed)
   CHIEF COMPLAINT:   Chief Complaint  Patient presents with   Shortness of Breath    Subjective  Severe hypoxia Improving slowly Less WOB FiO2 (%):  [80 %-100 %] 80 %      Objective   Examination:  General exam: Appears calm and comfortable  Respiratory system: Clear to auscultation. Respiratory effort normal. HEENT: [place]/AT, PERRLA, no thrush, no stridor. Cardiovascular system: S1 & S2 heard, RRR. No JVD, murmurs, rubs, gallops or clicks.   VITALS:  height is 6\' 1"  (1.854 m) and weight is 73.6 kg. His oral temperature is 97.6 F (36.4 C). His blood pressure is 149/93 (abnormal) and his pulse is 97. His respiration is 26 (abnormal) and oxygen saturation is 98%.   I personally reviewed Labs under Results section.  Radiology Reports DG Chest Port 1 View  Result Date: 05/01/2021 CLINICAL DATA:  Shortness of breath, weakness, COVID EXAM: PORTABLE CHEST 1 VIEW COMPARISON:  04/26/2021 FINDINGS: Patchy bilateral airspace disease, new since prior study compatible with multifocal pneumonia. Heart is normal size. No visible effusions or pneumothorax. Aortic atherosclerosis. No acute bony abnormality. IMPRESSION: New extensive patchy bilateral airspace disease compatible with multifocal pneumonia. Electronically Signed   By: 06/26/2021 M.D.   On: 04/29/2021 17:45       Assessment/Plan:  Severe COVID-19 infection, ARDS and pneumonia/pneumonitis Continue IV steroids  Aggressive pulm toilet recommended OOB to chair as tolerated  Pulmonary hygiene Continue proning as tolerated due to severe hypoxia   Maintain airborne and contact precautions  As needed bronchodilators (MDI) Vitamin C and zinc Antitussives   Code Status: FULL   Disposition Plan: consider SD status, transfer to [facility]  DVT Prophylaxis  Heparin infusion     LOS: 1 day   [facility]   MEDICATION ADJUSTMENTS/LABS AND TESTS ORDERED:  CURRENT MEDICATIONS REVIEWED AT LENGTH WITH PATIENT  TODAY      Giovani Neumeister Erin Fulling, M.D.  [facility]  Medical Director [facility] Medical Director [facility] Cardio-Pulmonary Department

## 2021-05-04 NOTE — Consult Note (Deleted)
Pharmacy Antibiotic Note  Jonathan Salinas is a 85 y.o. male admitted on 04/26/2021 with pneumonia/Sepsis  Pharmacy has been consulted for Vancomycin and cefepime dosing.  Plan: Some renal recovery Scr 1.52>1.27. Will increase maintenance dose slightly to maintain AUC. MRSA PCR - negative, will d/c MD. Continue Cefepime 2 gram Q12H based on current renal function  Vancomycin 1.75g x1 LD; then Vancomycin 1g Q24H Goal AUC 400-550 Estimated AUC 473/Cmin: 12 Scr 1.27, TBW, Vd 0.72    Height: 6\' 1"  (185.4 cm) Weight: 73.6 kg (162 lb 4.1 oz) IBW/kg (Calculated) : 79.9  Temp (24hrs), Avg:97.8 F (36.6 C), Min:97.6 F (36.4 C), Max:98.1 F (36.7 C)  Recent Labs  Lab 05/02/2021 1719 05/14/2021 1746 05/12/2021 1923 05/04/21 0605  WBC 21.0*  --   --  14.8*  CREATININE 1.59* 1.52*  --  1.27*  LATICACIDVEN  --  6.2* 2.8* 1.5     Estimated Creatinine Clearance: 45.9 mL/min (A) (by C-G formula based on SCr of 1.27 mg/dL (H)).    Allergies  Allergen Reactions   Penicillins     Antimicrobials this admission: 10/10 cefepime >>  10/10 vancomycin >>   Dose adjustments this admission: N/a  Microbiology results: 10/10 BCx: NG12h 10/10 Sputum: Pending  10/10 MRSA PCR: Negative  Thank you for allowing pharmacy to be a part of this patient's care.  07/04/21, PharmD, [facility] Clinical Pharmacist 05/04/2021 7:50 AM

## 2021-05-04 NOTE — Progress Notes (Signed)
Patient ID: Jonathan Salinas, male   DOB: 1935-11-29, 85 y.o.   MRN: [id number]  85 yo patient admitted for COVID pneumonia, hypoxia improving fio2 down to 55%, high flow [place], less WOB, on heparin, steroids and ABX. patient is now SD status. self proning, remains FULL CODE, patient  transferred to [facility] today and will round from 10/12  Pt is Unvaccinated against COVID

## 2021-05-04 NOTE — Progress Notes (Signed)
*  PRELIMINARY RESULTS* Echocardiogram 2D Echocardiogram has been performed.  Jonathan Salinas 05/04/2021, 1:04 PM 

## 2021-05-04 NOTE — Consult Note (Signed)
PHARMACY CONSULT NOTE - FOLLOW UP  Pharmacy Consult for Electrolyte Monitoring and Replacement   Recent Labs: Potassium (mmol/L)  Date Value  05/04/2021 3.8   Magnesium (mg/dL)  Date Value  85/27/7824 2.2   Calcium (mg/dL)  Date Value  23/53/6144 8.0 (L)   Albumin (g/dL)  Date Value  31/54/0086 1.9 (L)  07/29/2019 4.2   Phosphorus (mg/dL)  Date Value  76/19/5093 4.0   Sodium (mmol/L)  Date Value  05/04/2021 136  07/29/2019 141     Assessment: 85yo Male w/ h/o GERD & HTN had recent outpatient treatment with doxycycline but had worsening SOB later found to be covid+. Presenting 10/10 with multifocal PNA (on VAN/CFP > CFP), Covid+, & elevated D-dimer started on empiric heparin therapy d/t c/f thrombi. Pharmacy consulted for mgmt of electrolytes  Goal of Therapy:  Lytes WNL  Plan:  MIVF on NaHCO3 @75ml /h K: 3.4>3.8 Scr: 1.52>1.27  ,PharmD Clinical Pharmacist 05/04/2021 11:46 AM

## 2021-05-05 ENCOUNTER — Inpatient Hospital Stay: Payer: Medicare Other

## 2021-05-05 ENCOUNTER — Encounter: Payer: Self-pay | Admitting: Radiology

## 2021-05-05 DIAGNOSIS — J1282 Pneumonia due to coronavirus disease 2019: Secondary | ICD-10-CM

## 2021-05-05 DIAGNOSIS — I2699 Other pulmonary embolism without acute cor pulmonale: Secondary | ICD-10-CM | POA: Diagnosis present

## 2021-05-05 DIAGNOSIS — U071 COVID-19: Principal | ICD-10-CM

## 2021-05-05 LAB — CBC WITH DIFFERENTIAL/PLATELET
Abs Immature Granulocytes: 0.23 10*3/uL — ABNORMAL HIGH (ref 0.00–0.07)
Basophils Absolute: 0 10*3/uL (ref 0.0–0.1)
Basophils Relative: 0 %
Eosinophils Absolute: 0 10*3/uL (ref 0.0–0.5)
Eosinophils Relative: 0 %
HCT: 31.5 % — ABNORMAL LOW (ref 39.0–52.0)
Hemoglobin: 11 g/dL — ABNORMAL LOW (ref 13.0–17.0)
Immature Granulocytes: 1 %
Lymphocytes Relative: 3 %
Lymphs Abs: 0.6 10*3/uL — ABNORMAL LOW (ref 0.7–4.0)
MCH: 32.2 pg (ref 26.0–34.0)
MCHC: 34.9 g/dL (ref 30.0–36.0)
MCV: 92.1 fL (ref 80.0–100.0)
Monocytes Absolute: 1 10*3/uL (ref 0.1–1.0)
Monocytes Relative: 5 %
Neutro Abs: 17.1 10*3/uL — ABNORMAL HIGH (ref 1.7–7.7)
Neutrophils Relative %: 91 %
Platelets: 278 10*3/uL (ref 150–400)
RBC: 3.42 MIL/uL — ABNORMAL LOW (ref 4.22–5.81)
RDW: 13.1 % (ref 11.5–15.5)
WBC: 19 10*3/uL — ABNORMAL HIGH (ref 4.0–10.5)
nRBC: 0 % (ref 0.0–0.2)

## 2021-05-05 LAB — COMPREHENSIVE METABOLIC PANEL
ALT: 26 U/L (ref 0–44)
AST: 42 U/L — ABNORMAL HIGH (ref 15–41)
Albumin: 1.7 g/dL — ABNORMAL LOW (ref 3.5–5.0)
Alkaline Phosphatase: 49 U/L (ref 38–126)
Anion gap: 10 (ref 5–15)
BUN: 32 mg/dL — ABNORMAL HIGH (ref 8–23)
CO2: 26 mmol/L (ref 22–32)
Calcium: 7 mg/dL — ABNORMAL LOW (ref 8.9–10.3)
Chloride: 98 mmol/L (ref 98–111)
Creatinine, Ser: 1.06 mg/dL (ref 0.61–1.24)
GFR, Estimated: >60 mL/min (ref >60)
Glucose, Bld: 133 mg/dL — ABNORMAL HIGH (ref 70–99)
Potassium: 3.4 mmol/L — ABNORMAL LOW (ref 3.5–5.1)
Sodium: 134 mmol/L — ABNORMAL LOW (ref 135–145)
Total Bilirubin: 0.7 mg/dL (ref 0.3–1.2)
Total Protein: 5.4 g/dL — ABNORMAL LOW (ref 6.5–8.1)

## 2021-05-05 LAB — HEPARIN LEVEL (UNFRACTIONATED)
Heparin Unfractionated: 0.23 IU/mL — ABNORMAL LOW (ref 0.30–0.70)
Heparin Unfractionated: 0.68 IU/mL (ref 0.30–0.70)
Heparin Unfractionated: 0.7 IU/mL (ref 0.30–0.70)

## 2021-05-05 LAB — C-REACTIVE PROTEIN: CRP: 14.7 mg/dL — ABNORMAL HIGH (ref <1.0)

## 2021-05-05 LAB — D-DIMER, QUANTITATIVE: D-Dimer, Quant: 19.34 ug/mL-FEU — ABNORMAL HIGH (ref 0.00–0.50)

## 2021-05-05 LAB — GLUCOSE, CAPILLARY
Glucose-Capillary: 124 mg/dL — ABNORMAL HIGH (ref 70–99)
Glucose-Capillary: 122 mg/dL — ABNORMAL HIGH (ref 70–99)

## 2021-05-05 LAB — MAGNESIUM: Magnesium: 2.2 mg/dL (ref 1.7–2.4)

## 2021-05-05 LAB — PHOSPHORUS: Phosphorus: 2.9 mg/dL (ref 2.5–4.6)

## 2021-05-05 LAB — FERRITIN: Ferritin: 616 ng/mL — ABNORMAL HIGH (ref 24–336)

## 2021-05-05 MED ORDER — POTASSIUM CHLORIDE CRYS ER 20 MEQ PO TBCR
40.0000 meq | EXTENDED_RELEASE_TABLET | Freq: Once | ORAL | Status: AC
  Administered 2021-05-05: 40 meq via ORAL
  Filled 2021-05-05: qty 2

## 2021-05-05 MED ORDER — HEPARIN BOLUS VIA INFUSION
1050.0000 [IU] | Freq: Once | INTRAVENOUS | Status: AC
  Administered 2021-05-05: 1050 [IU] via INTRAVENOUS
  Filled 2021-05-05: qty 1050

## 2021-05-05 MED ORDER — ORAL CARE MOUTH RINSE
15.0000 mL | Freq: Two times a day (BID) | OROMUCOSAL | Status: DC
  Administered 2021-05-05 – 2021-05-11 (×11): 15 mL via OROMUCOSAL

## 2021-05-05 MED ORDER — METHYLPREDNISOLONE SODIUM SUCC 125 MG IJ SOLR
60.0000 mg | Freq: Two times a day (BID) | INTRAMUSCULAR | Status: DC
  Administered 2021-05-05 – 2021-05-06 (×4): 60 mg via INTRAVENOUS
  Filled 2021-05-05 (×5): qty 2

## 2021-05-05 MED ORDER — IOHEXOL 350 MG/ML SOLN
75.0000 mL | Freq: Once | INTRAVENOUS | Status: AC | PRN
  Administered 2021-05-05: 75 mL via INTRAVENOUS

## 2021-05-05 MED ORDER — BARICITINIB 2 MG PO TABS
4.0000 mg | ORAL_TABLET | Freq: Every day | ORAL | Status: DC
  Administered 2021-05-05 – 2021-05-09 (×4): 4 mg via ORAL
  Filled 2021-05-05 (×6): qty 2

## 2021-05-05 NOTE — Consult Note (Signed)
PHARMACY CONSULT NOTE - FOLLOW UP  Pharmacy Consult for Electrolyte Monitoring and Replacement   Recent Labs: Potassium (mmol/L)  Date Value  05/05/2021 3.4 (L)   Magnesium (mg/dL)  Date Value  36/14/4315 2.2   Calcium (mg/dL)  Date Value  40/02/6760 7.0 (L)   Albumin (g/dL)  Date Value  95/03/3266 1.7 (L)  07/29/2019 4.2   Phosphorus (mg/dL)  Date Value  12/45/8099 2.9   Sodium (mmol/L)  Date Value  05/05/2021 134 (L)  07/29/2019 141     Assessment: 85yo Male w/ h/o GERD & HTN had recent outpatient treatment with doxycycline but had worsening SOB later found to be covid+. Presenting 10/10 with multifocal PNA (on VAN/CFP > CFP), Covid+, & elevated D-dimer started on empiric heparin therapy d/t c/f thrombi. Pharmacy consulted for mgmt of electrolytes  K: 3.8>3.4 Scr: 1.52>1.27>1.06 MIVF on NaHCO3 @75ml /h  Goal of Therapy:  Lytes WNL  Plan:  Renal continues to improve; UOP 0.60ml/k/h. continues on NaHCO3 gtt. K slightly low at 3.4; will replace with KCL 4m PO x1 Other lytes WNL; no further repletion CTM with AM labs  ,PharmD, [facility] Clinical Pharmacist 05/05/2021 8:36 AM

## 2021-05-05 NOTE — Consult Note (Signed)
ANTICOAGULATION CONSULT NOTE - Follow Up Consult  Pharmacy Consult for heparin drip Indication:  Elevated D-dimer and troponin in COVID-19 positive pt  Allergies  Allergen Reactions   Penicillins     Patient Measurements: Height: 6\' 1"  (185.4 cm) Weight: 73.6 kg (162 lb 4.1 oz) IBW/kg (Calculated) : 79.9 Heparin Dosing Weight: 71.2kg  Vital Signs: Temp: 97.6 F (36.4 C) (10/12 0000) Temp Source: Oral (10/12 0000) BP: 120/72 (10/12 0000) Pulse Rate: 66 (10/12 0000)  Labs: Recent Labs    05-05-2021 1719 05-May-2021 1746 2021-05-05 1923 May 05, 2021 1940 05/04/21 0605 05/04/21 1547 05/05/21 0054  HGB 14.1  --   --   --  12.2*  --  11.0*  HCT 41.3  --   --   --  34.0*  --  31.5*  PLT 315  --   --   --  254  --  278  APTT  --   --   --  31  --   --   --   LABPROT  --   --   --  17.7*  --   --   --   INR  --   --   --  1.5*  --   --   --   HEPARINUNFRC  --   --   --   --  0.14* 0.40 0.23*  CREATININE 1.59* 1.52*  --   --  1.27*  --   --   TROPONINIHS 224*  --  223*  --   --   --   --     Estimated Creatinine Clearance: 45.9 mL/min (A) (by C-G formula based on SCr of 1.27 mg/dL (H)).   Medications:  No PTA anticoagulation of note Heparin Dosing Weight: 71.2kg  Assessment: 85 yo male admitted with acute hypoxic respiratory failure, COVID+ PNA, and elevated D-Dimer.  10/10: CT Chest: multifocal PNA, no evidence of PE noted.  Date Time HL Rate/Comment 10/11 0605 0.14 Subthera; 1150 > 1400 un/hr 10/11 1547 0.40 Thera x1, keep 1400un/hr  10/12  0054    0.23     Subtherapeutic , 1400 > 1550 units/hr     Baseline Labs: aPTT - 31s INR - 1.5 Hgb - 14.1>12.2 Plts - 315>254 D-dimer >20; fibrinogen 535; LDH 395; trop 223  Goal of Therapy:  Heparin level 0.3-0.7 units/ml Monitor platelets by anticoagulation protocol: Yes   Plan: 10/12 :  HL @ 0054 = 0.23, subtherapeutic  Will order heparin 1050 units IV X 1 bolus and increase drip rate to 1550 units/hr. Will recheck HL 8  hrs after rate change.   Raeshaun Simson D, PharmD 05/05/2021 1:34 AM

## 2021-05-05 NOTE — Consult Note (Signed)
ANTICOAGULATION CONSULT NOTE - Follow Up Consult  Pharmacy Consult for heparin drip Indication:  Elevated D-dimer and troponin in COVID-19 positive pt  Allergies  Allergen Reactions   Penicillins     Patient Measurements: Height: 6\' 1"  (185.4 cm) Weight: 73 kg (160 lb 15 oz) IBW/kg (Calculated) : 79.9 Heparin Dosing Weight: 71.2kg  Vital Signs: Temp: 98 F (36.7 C) (10/12 1600) Temp Source: Oral (10/12 1600) BP: 161/88 (10/12 1900) Pulse Rate: 93 (10/12 1956)  Labs: Recent Labs    05/08/2021 1719 04/27/2021 1746 05/02/2021 1923 05/04/2021 1940 05/04/21 0605 05/04/21 1547 05/05/21 0054 05/05/21 1055 05/05/21 1957  HGB 14.1  --   --   --  12.2*  --  11.0*  --   --   HCT 41.3  --   --   --  34.0*  --  31.5*  --   --   PLT 315  --   --   --  254  --  278  --   --   APTT  --   --   --  31  --   --   --   --   --   LABPROT  --   --   --  17.7*  --   --   --   --   --   INR  --   --   --  1.5*  --   --   --   --   --   HEPARINUNFRC  --   --   --   --  0.14*   < > 0.23* 0.68 0.70  CREATININE 1.59* 1.52*  --   --  1.27*  --  1.06  --   --   TROPONINIHS 224*  --  223*  --   --   --   --   --   --    < > = values in this interval not displayed.    Estimated Creatinine Clearance: 54.5 mL/min (by C-G formula based on SCr of 1.06 mg/dL).   Medications:  No PTA anticoagulation of note Heparin Dosing Weight: 71.2kg  Assessment: 85 yo male admitted with acute hypoxic respiratory failure, COVID+ PNA, and elevated D-Dimer.  10/10: CT Chest: multifocal PNA, no evidence of PE noted.  Date Time HL Rate/Comment 10/11 0605 0.14 Subthera; 1150 > 1400 un/hr 10/11 1547 0.40 Thera x1, keep 1400un/hr  10/12  0054    0.23     Subtherapeutic , 1400 > 1550 units/hr 10/12 1055 0.68 Therapeutic x1; 1550 un/hr   10/12 1957 0.70 Therapeutic x 2    Baseline Labs: aPTT - 31s INR - 1.5 Hgb - 14.1>12.2 Plts - 315>254 D-dimer >20; fibrinogen 535; LDH 395; trop 223  Goal of Therapy:  Heparin  level 0.3-0.7 units/ml Monitor platelets by anticoagulation protocol: Yes   Plan: Heparin remains therapeutic.  Continue heparin infusion at 1550 units/hr. Will recheck HL with AM labs CBC per protocol   0606, PharmD, [facility] Clinical Pharmacist   05/05/2021 9:37 PM

## 2021-05-05 NOTE — Progress Notes (Signed)
PROGRESS NOTE    Jonathan Salinas  YQM:[id number] DOB: 04/03/1936 DOA: 05/10/2021 PCP: [name], MD  Outpatient Specialists: none    Brief Narrative:   From admission h and p: This is an 85 yo male who presented to [facility] ER on 10/10 via EMS with c/o shortness of breath onset 09/29.Marland Kitchen  He was evaluated at [facility] on 10/3, and initially treated with doxycycline 100 mg bid for suspected bronchitis pending COVID-19 test results.  Pt is unvaccinated.  His COVID-19 results came back positive, however he was out of the window for antiviral treatment, and provider recommended supportive care.  On 10/10 pt reported each time he took the doxycycline as prescribed he vomited and he continued to have worsening shortness of breath, therefore EMS notified. EMS reported upon their arrival at pts home his O2 sats were 68% on RA.  EMS placed pt on NRB @15L , pt received  solumedrol and duonebs with O2 sats increasing to the 80's.   ED Course: Upon arrival to the ER pt remained hypoxic with O2 sats in the low to mid 80's requiring transition to 100% HHFNC @60  L/min.  CXR concerning for multifocal pneumonia.  Lab results revealed wbc 21.0 and fibrinogen 535.  Pt received cefepime and vancomycin.  PCCM team contacted for ICU admission.        Assessment & Plan:   Active Problems:   COVID-19  # Acute hypoxic respiratory failure # Covid pneumonia O2 80s on arrival, initially requiring 100% hfnc @ 60. Now weaned down to 55% at 45 L/min. Symptoms began 9/29. Treated w/ doxycycline as outpatient. Not vaccinated. - continue methylpred (started 10/10), will plan to taper over the next couple of days if respiratory status continues to improve. - start baricitinib (start 10/12) - stop cefepime, no evidence pneumonia - check CTA (inflammatory markers markedly elevated), continue heparin for the time being  # HTN Here normotenxive - hold home lisinopril   DVT prophylaxis: heparin Code Status:  full Family Communication: son 10/29 updated telephonically 10/12  Level of care: Stepdown Status is: Inpatient  Remains inpatient appropriate because:Inpatient level of care appropriate due to severity of illness  Dispo: The patient is from: Home              Anticipated d/c is to: Home              Patient currently is not medically stable to d/c.   Difficult to place patient No        Consultants:  none  Procedures: none  Antimicrobials:  S/p cefepime    Subjective: This morning breathing a little big better. No chest pain.  Objective: Vitals:   05/05/21 0235 05/05/21 0401 05/05/21 0600 05/05/21 0820  BP:   128/72   Pulse: 74  67   Resp: (!) 23  (!) 23   Temp:      TempSrc:      SpO2: 93%  90% 92%  Weight:  73 kg    Height:        Intake/Output Summary (Last 24 hours) at 05/05/2021 0956 Last data filed at 05/05/2021 0600 Gross per 24 hour  Intake 800 ml  Output 1400 ml  Net -600 ml   Filed Weights   05/10/2021 1900 05/04/21 0451 05/05/21 0401  Weight: 71.2 kg 73.6 kg 73 kg    Examination:  General exam: Appears calm and comfortable  Respiratory system: scattered rhonchi and rales Cardiovascular system: S1 & S2 heard, RRR. No  JVD, murmurs, rubs, gallops or clicks. No pedal edema. Gastrointestinal system: Abdomen is nondistended, soft and nontender. No organomegaly or masses felt. Normal bowel sounds heard. Central nervous system: Alert and oriented. No focal neurological deficits. Extremities: Symmetric 5 x 5 power. Skin: No rashes, lesions or ulcers Psychiatry: Judgement and insight appear normal. Mood & affect appropriate.     Data Reviewed: I have personally reviewed following labs and imaging studies  CBC: Recent Labs  Lab 22-May-2021 1719 05/04/21 0605 05/05/21 0054  WBC 21.0* 14.8* 19.0*  NEUTROABS 18.1* 13.4* 17.1*  HGB 14.1 12.2* 11.0*  HCT 41.3 34.0* 31.5*  MCV 91.8 90.2 92.1  PLT 315 254 278   Basic Metabolic Panel: Recent  Labs  Lab 2021/05/22 1719 05-22-21 1746 05/04/21 0605 05/05/21 0054  NA 137  --  136 134*  K 3.4*  --  3.8 3.4*  CL 101  --  103 98  CO2 15*  --  23 26  GLUCOSE 184*  --  129* 133*  BUN 34*  --  35* 32*  CREATININE 1.59* 1.52* 1.27* 1.06  CALCIUM 8.5*  --  8.0* 7.0*  MG  --  2.2 2.2 2.2  PHOS  --  4.1 4.0 2.9   GFR: Estimated Creatinine Clearance: 54.5 mL/min (by C-G formula based on SCr of 1.06 mg/dL). Liver Function Tests: Recent Labs  Lab 05/22/2021 1719 05/04/21 0605 05/05/21 0054  AST 55* 27 42*  ALT 32 23 26  ALKPHOS 73 57 49  BILITOT 1.1 1.0 0.7  PROT 7.5 6.2* 5.4*  ALBUMIN 2.4* 1.9* 1.7*   No results for input(s): LIPASE, AMYLASE in the last 168 hours. No results for input(s): AMMONIA in the last 168 hours. Coagulation Profile: Recent Labs  Lab 2021-05-22 1940  INR 1.5*   Cardiac Enzymes: No results for input(s): CKTOTAL, CKMB, CKMBINDEX, TROPONINI in the last 168 hours. BNP (last 3 results) No results for input(s): PROBNP in the last 8760 hours. HbA1C: Recent Labs    05/04/21 0605  HGBA1C 5.5   CBG: Recent Labs  Lab 05/04/21 1551 05/04/21 1912 05/04/21 2311 05/05/21 0301 05/05/21 0751  GLUCAP 147* 133* 118* 124* 122*   Lipid Profile: Recent Labs    2021/05/22 1746  TRIG 156*   Thyroid Function Tests: Recent Labs    May 22, 2021 1719  TSH 0.820  FREET4 1.64*   Anemia Panel: Recent Labs    05/04/21 0605 05/05/21 0054  FERRITIN 621* 616*   Urine analysis:    Component Value Date/Time   BILIRUBINUR neg 11/20/2015 1511   PROTEINUR neg 11/20/2015 1511   UROBILINOGEN 0.2 11/20/2015 1511   NITRITE neg 11/20/2015 1511   LEUKOCYTESUR Negative 11/20/2015 1511   Sepsis Labs: @LABRCNTIP (procalcitonin:4,lacticidven:4)  ) Recent Results (from the past 240 hour(s))  SARS CORONAVIRUS 2 (TAT 6-24 HRS) Nasopharyngeal Nasopharyngeal Swab     Status: Abnormal   Collection Time: 04/26/21  1:50 PM   Specimen: Nasopharyngeal Swab  Result Value Ref  Range Status   SARS Coronavirus 2 POSITIVE (A) NEGATIVE Final    Comment: (NOTE) SARS-CoV-2 target nucleic acids are DETECTED.  The SARS-CoV-2 RNA is generally detectable in upper and lower respiratory specimens during the acute phase of infection. Positive results are indicative of the presence of SARS-CoV-2 RNA. Clinical correlation with patient history and other diagnostic information is  necessary to determine patient infection status. Positive results do not rule out bacterial infection or co-infection with other viruses.  The expected result is Negative.  Fact Sheet for Patients:  HairSlick.no  Fact Sheet for Healthcare Providers: quierodirigir.com  This test is not yet approved or cleared by the [place] FDA and  has been authorized for detection and/or diagnosis of SARS-CoV-2 by FDA under an Emergency Use Authorization (EUA). This EUA will remain  in effect (meaning this test can be used) for the duration of the COVID-19 declaration under Section 564(b)(1) of the Act, 21 U. S.C. section 360bbb-3(b)(1), unless the authorization is terminated or revoked sooner.   Performed at [facility], [address], [place], [place] [address]   Blood Culture (routine x 2)     Status: None (Preliminary result)   Collection Time: 04/29/2021  5:33 PM   Specimen: BLOOD  Result Value Ref Range Status   Specimen Description BLOOD BLOOD LEFT FOREARM  Final   Special Requests   Final    BOTTLES DRAWN AEROBIC AND ANAEROBIC Blood Culture adequate volume   Culture   Final    NO GROWTH 2 DAYS Performed at [facility], [address], [place], [place] [address]    Report Status PENDING  Incomplete  Blood Culture (routine x 2)     Status: None (Preliminary result)   Collection Time: 05/02/2021  5:40 PM   Specimen: BLOOD  Result Value Ref Range Status   Specimen Description BLOOD BLOOD RIGHT FOREARM  Final   Special  Requests   Final    BOTTLES DRAWN AEROBIC AND ANAEROBIC Blood Culture adequate volume   Culture   Final    NO GROWTH 2 DAYS Performed at [facility], [address], [place], [place] [address]    Report Status PENDING  Incomplete  MRSA Next Gen by PCR, Nasal     Status: None   Collection Time: 05/23/2021  7:07 PM   Specimen: Nasal Mucosa; Nasal Swab  Result Value Ref Range Status   MRSA by PCR Next Gen NOT DETECTED NOT DETECTED Final    Comment: (NOTE) The GeneXpert MRSA Assay (FDA approved for NASAL specimens only), is one component of a comprehensive MRSA colonization surveillance program. It is not intended to diagnose MRSA infection nor to guide or monitor treatment for MRSA infections. Test performance is not FDA approved in patients less than 55 years old. Performed at [facility], [address] [place], [place] [address]          Radiology Studies: DG Chest [place] 1 View  Result Date: 05/04/2021 CLINICAL DATA:  Shortness of breath, weakness, COVID EXAM: PORTABLE CHEST 1 VIEW COMPARISON:  04/26/2021 FINDINGS: Patchy bilateral airspace disease, new since prior study compatible with multifocal pneumonia. Heart is normal size. No visible effusions or pneumothorax. Aortic atherosclerosis. No acute bony abnormality. IMPRESSION: New extensive patchy bilateral airspace disease compatible with multifocal pneumonia. Electronically Signed   By: Charlett Nose M.D.   On: 05/13/2021 17:45   ECHOCARDIOGRAM COMPLETE  Result Date: 05/04/2021    ECHOCARDIOGRAM REPORT   Patient Name:   NATALE THOMA Date of Exam: 05/04/2021 Medical Rec #:  [id number]        Height:       73.0 in Accession #:    [id number]       Weight:       162.3 lb Date of Birth:  1935-09-15        BSA:          1.969 m Patient Age:    85 years         BP:  128/88 mmHg Patient Gender: M                HR:           32 bpm. Exam Location:  [facility] Procedure: 2D Echo, Color Doppler and Cardiac  Doppler Indications:     Acute respiratory distress R06.03  History:         Patient has no prior history of Echocardiogram examinations.                  Risk Factors:Hypertension.  Sonographer:     Cristela Blue Referring Phys:  [id number] DANA E GRAVES Diagnosing Phys: Cristal Deer End MD  Sonographer Comments: Suboptimal parasternal window and no subcostal window. IMPRESSIONS  1. Left ventricular ejection fraction, by estimation, is 55 to 60%. The left ventricle has normal function. Left ventricular endocardial border not optimally defined to evaluate regional wall motion. There is mild to moderate left ventricular hypertrophy. Left ventricular diastolic parameters are consistent with Grade I diastolic dysfunction (impaired relaxation).  2. Right ventricular systolic function is low normal. The right ventricular size is mildly enlarged.  3. The mitral valve is abnormal. Mild to moderate mitral valve regurgitation. No evidence of mitral stenosis.  4. The aortic valve was not well visualized. Aortic valve regurgitation is not visualized. No aortic stenosis is present. FINDINGS  Left Ventricle: Left ventricular ejection fraction, by estimation, is 55 to 60%. The left ventricle has normal function. Left ventricular endocardial border not optimally defined to evaluate regional wall motion. The left ventricular internal cavity size was normal in size. There is mild to moderate left ventricular hypertrophy. Left ventricular diastolic parameters are consistent with Grade I diastolic dysfunction (impaired relaxation). Right Ventricle: The right ventricular size is mildly enlarged. Right vetricular wall thickness was not well visualized. Right ventricular systolic function is low normal. Left Atrium: Left atrial size was normal in size. Right Atrium: Right atrial size was normal in size. Pericardium: The pericardium was not well visualized. Mitral Valve: The mitral valve is abnormal. Mild to moderate mitral valve regurgitation.  No evidence of mitral valve stenosis. Tricuspid Valve: The tricuspid valve is not well visualized. Tricuspid valve regurgitation is not demonstrated. Aortic Valve: The aortic valve was not well visualized. Aortic valve regurgitation is not visualized. No aortic stenosis is present. Aortic valve mean gradient measures 2.0 mmHg. Aortic valve peak gradient measures 3.4 mmHg. Aortic valve area, by VTI measures 3.19 cm. Pulmonic Valve: The pulmonic valve was not well visualized. Aorta: The aortic root is normal in size and structure. Pulmonary Artery: The pulmonary artery is not well seen. Venous: The inferior vena cava was not well visualized. IAS/Shunts: The interatrial septum was not well visualized.  LEFT VENTRICLE PLAX 2D LVIDd:         3.80 cm   Diastology LVIDs:         2.70 cm   LV e' medial:    5.76 cm/s LV PW:         1.30 cm   LV E/e' medial:  9.3 LV IVS:        1.20 cm   LV e' lateral:   7.40 cm/s LVOT diam:     2.00 cm   LV E/e' lateral: 7.2 LV SV:         58 LV SV Index:   30 LVOT Area:     3.14 cm  RIGHT VENTRICLE RV Basal diam:  3.50 cm RV S prime:     11.88 cm/s TAPSE (M-mode):  1.6 cm LEFT ATRIUM           Index        RIGHT ATRIUM           Index LA diam:      4.40 cm 2.24 cm/m   RA Area:     13.80 cm LA Vol (A2C): 23.4 ml 11.89 ml/m  RA Volume:   29.90 ml  15.19 ml/m LA Vol (A4C): 35.7 ml 18.13 ml/m  AORTIC VALVE AV Area (Vmax):    3.13 cm AV Area (Vmean):   2.87 cm AV Area (VTI):     3.19 cm AV Vmax:           91.80 cm/s AV Vmean:          69.500 cm/s AV VTI:            0.183 m AV Peak Grad:      3.4 mmHg AV Mean Grad:      2.0 mmHg LVOT Vmax:         91.40 cm/s LVOT Vmean:        63.400 cm/s LVOT VTI:          0.186 m LVOT/AV VTI ratio: 1.02  AORTA Ao Root diam: 2.90 cm MITRAL VALVE               TRICUSPID VALVE MV Area (PHT): 6.02 cm    TR Peak grad:   16.2 mmHg MV Decel Time: 126 msec    TR Vmax:        201.00 cm/s MV E velocity: 53.60 cm/s MV A velocity: 94.70 cm/s  SHUNTS MV E/A ratio:   0.57        Systemic VTI:  0.19 m                            Systemic Diam: 2.00 cm Yvonne Kendall MD Electronically signed by Yvonne Kendall MD Signature Date/Time: 05/04/2021/6:49:13 PM    Final         Scheduled Meds:  albuterol  2 puff Inhalation Q6H   vitamin C  500 mg Oral Daily   Chlorhexidine Gluconate Cloth  6 each Topical Q0600   feeding supplement  1 Container Oral TID BM   folic acid  1 mg Intravenous Daily   insulin aspart  0-15 Units Subcutaneous Q4H   multivitamin with minerals  1 tablet Oral Daily   [START ON 05/06/2021] predniSONE  50 mg Oral Daily   sodium chloride flush  3 mL Intravenous Q12H   thiamine injection  100 mg Intravenous Daily   zinc sulfate  220 mg Oral Daily   Continuous Infusions:  sodium chloride     ceFEPime (MAXIPIME) IV 2 g (05/05/21 0910)   famotidine (PEPCID) IV 20 mg (05/04/21 2203)   heparin 1,550 Units/hr (05/05/21 0248)     LOS: 2 days    Time spent: 45 min    Silvano Bilis, MD [facility]   If 7PM-7AM, please contact night-coverage [email or web address] Password [email or web address] 05/05/2021, 9:56 AM

## 2021-05-05 NOTE — Consult Note (Signed)
ANTICOAGULATION CONSULT NOTE - Follow Up Consult  Pharmacy Consult for heparin drip Indication:  Elevated D-dimer and troponin in COVID-19 positive pt  Allergies  Allergen Reactions   Penicillins     Patient Measurements: Height: 6\' 1"  (185.4 cm) Weight: 73 kg (160 lb 15 oz) IBW/kg (Calculated) : 79.9 Heparin Dosing Weight: 71.2kg  Vital Signs: Temp: 97.6 F (36.4 C) (10/12 0000) Temp Source: Oral (10/12 0000) BP: 147/83 (10/12 1100) Pulse Rate: 81 (10/12 1100)  Labs: Recent Labs    05/23/2021 1719 05/23/21 1719 05-23-2021 1746 05-23-2021 1923 May 23, 2021 1940 05/04/21 0605 05/04/21 1547 05/05/21 0054 05/05/21 1055  HGB 14.1  --   --   --   --  12.2*  --  11.0*  --   HCT 41.3  --   --   --   --  34.0*  --  31.5*  --   PLT 315  --   --   --   --  254  --  278  --   APTT  --   --   --   --  31  --   --   --   --   LABPROT  --   --   --   --  17.7*  --   --   --   --   INR  --   --   --   --  1.5*  --   --   --   --   HEPARINUNFRC  --    < >  --   --   --  0.14* 0.40 0.23* 0.68  CREATININE 1.59*  --  1.52*  --   --  1.27*  --  1.06  --   TROPONINIHS 224*  --   --  223*  --   --   --   --   --    < > = values in this interval not displayed.    Estimated Creatinine Clearance: 54.5 mL/min (by C-G formula based on SCr of 1.06 mg/dL).   Medications:  No PTA anticoagulation of note Heparin Dosing Weight: 71.2kg  Assessment: 85 yo male admitted with acute hypoxic respiratory failure, COVID+ PNA, and elevated D-Dimer.  10/10: CT Chest: multifocal PNA, no evidence of PE noted.  Date Time HL Rate/Comment 10/11 0605 0.14 Subthera; 1150 > 1400 un/hr 10/11 1547 0.40 Thera x1, keep 1400un/hr  10/12  0054    0.23     Subtherapeutic , 1400 > 1550 units/hr 10/12 1055 0.68 Therapeutic x1; 1550 un/hr     Baseline Labs: aPTT - 31s INR - 1.5 Hgb - 14.1>12.2 Plts - 315>254 D-dimer >20; fibrinogen 535; LDH 395; trop 223  Goal of Therapy:  Heparin level 0.3-0.7 units/ml Monitor  platelets by anticoagulation protocol: Yes   Plan: 10/12 :  HL @ 1055 = 0.68, Therapeutic  x1 Continue heparin infusion at 1550 units/hr. Will recheck HL 8 hrs to confirm rate.   [id number], PharmD, [facility] Clinical Pharmacist 05/05/2021 11:29 AM

## 2021-05-06 ENCOUNTER — Other Ambulatory Visit (INDEPENDENT_AMBULATORY_CARE_PROVIDER_SITE_OTHER): Payer: Self-pay | Admitting: Vascular Surgery

## 2021-05-06 DIAGNOSIS — U071 COVID-19: Secondary | ICD-10-CM | POA: Diagnosis not present

## 2021-05-06 LAB — CBC WITH DIFFERENTIAL/PLATELET
Abs Immature Granulocytes: 0.24 10*3/uL — ABNORMAL HIGH (ref 0.00–0.07)
Basophils Absolute: 0 10*3/uL (ref 0.0–0.1)
Basophils Relative: 0 %
Eosinophils Absolute: 0 10*3/uL (ref 0.0–0.5)
Eosinophils Relative: 0 %
HCT: 34.1 % — ABNORMAL LOW (ref 39.0–52.0)
Hemoglobin: 11.6 g/dL — ABNORMAL LOW (ref 13.0–17.0)
Immature Granulocytes: 2 %
Lymphocytes Relative: 4 %
Lymphs Abs: 0.7 10*3/uL (ref 0.7–4.0)
MCH: 31.3 pg (ref 26.0–34.0)
MCHC: 34 g/dL (ref 30.0–36.0)
MCV: 91.9 fL (ref 80.0–100.0)
Monocytes Absolute: 0.6 10*3/uL (ref 0.1–1.0)
Monocytes Relative: 4 %
Neutro Abs: 14.1 10*3/uL — ABNORMAL HIGH (ref 1.7–7.7)
Neutrophils Relative %: 90 %
Platelets: 320 10*3/uL (ref 150–400)
RBC: 3.71 MIL/uL — ABNORMAL LOW (ref 4.22–5.81)
RDW: 12.9 % (ref 11.5–15.5)
WBC: 15.6 10*3/uL — ABNORMAL HIGH (ref 4.0–10.5)
nRBC: 0 % (ref 0.0–0.2)

## 2021-05-06 LAB — D-DIMER, QUANTITATIVE: D-Dimer, Quant: 11.78 ug/mL-FEU — ABNORMAL HIGH (ref 0.00–0.50)

## 2021-05-06 LAB — COMPREHENSIVE METABOLIC PANEL
ALT: 24 U/L (ref 0–44)
AST: 28 U/L (ref 15–41)
Albumin: 1.9 g/dL — ABNORMAL LOW (ref 3.5–5.0)
Alkaline Phosphatase: 50 U/L (ref 38–126)
Anion gap: 9 (ref 5–15)
BUN: 27 mg/dL — ABNORMAL HIGH (ref 8–23)
CO2: 27 mmol/L (ref 22–32)
Calcium: 7.8 mg/dL — ABNORMAL LOW (ref 8.9–10.3)
Chloride: 100 mmol/L (ref 98–111)
Creatinine, Ser: 1.19 mg/dL (ref 0.61–1.24)
GFR, Estimated: >60 mL/min (ref >60)
Glucose, Bld: 154 mg/dL — ABNORMAL HIGH (ref 70–99)
Potassium: 4.4 mmol/L (ref 3.5–5.1)
Sodium: 136 mmol/L (ref 135–145)
Total Bilirubin: 0.7 mg/dL (ref 0.3–1.2)
Total Protein: 5.9 g/dL — ABNORMAL LOW (ref 6.5–8.1)

## 2021-05-06 LAB — C-REACTIVE PROTEIN: CRP: 8.8 mg/dL — ABNORMAL HIGH (ref <1.0)

## 2021-05-06 LAB — HEPARIN LEVEL (UNFRACTIONATED): Heparin Unfractionated: 0.69 IU/mL (ref 0.30–0.70)

## 2021-05-06 LAB — MAGNESIUM: Magnesium: 2.5 mg/dL — ABNORMAL HIGH (ref 1.7–2.4)

## 2021-05-06 MED ORDER — ENSURE ENLIVE PO LIQD
237.0000 mL | Freq: Three times a day (TID) | ORAL | Status: DC
  Administered 2021-05-06 – 2021-05-12 (×14): 237 mL via ORAL

## 2021-05-06 MED ORDER — SODIUM CHLORIDE 0.9 % IV SOLN: INTRAVENOUS | Status: DC

## 2021-05-06 MED ORDER — LISINOPRIL 10 MG PO TABS
10.0000 mg | ORAL_TABLET | Freq: Every day | ORAL | Status: DC
  Administered 2021-05-06 – 2021-05-08 (×3): 10 mg via ORAL
  Filled 2021-05-06 (×4): qty 2

## 2021-05-06 MED ORDER — FAMOTIDINE 20 MG PO TABS
20.0000 mg | ORAL_TABLET | Freq: Two times a day (BID) | ORAL | Status: DC
  Administered 2021-05-06: 20 mg via ORAL
  Filled 2021-05-06 (×2): qty 1

## 2021-05-06 NOTE — TOC Initial Note (Signed)
Transition of Care [facility]) - Initial/Assessment Note    Patient Details  Name: Jonathan Salinas MRN: [id number] Date of Birth: April 21, 1936  Transition of Care [facility]) CM/SW Contact:    Netawaka Cellar, RN Phone Number: 05/06/2021, 4:09 PM  Clinical Narrative:                 Attempted to outreach to son BARNET, BENAVIDES [facility])  [phone]. No answer and voicemail did not have message attached to owner. Will attempt at a later date.         Patient Goals and CMS Choice        Expected Discharge Plan and Services                                                Prior Living Arrangements/Services                       Activities of Daily Living Home Assistive Devices/Equipment: Eyeglasses, Hearing aid ADL Screening (condition at time of admission) Patient's cognitive ability adequate to safely complete daily activities?: Yes Is the patient deaf or have difficulty hearing?: Yes Does the patient have difficulty seeing, even when wearing glasses/contacts?: Yes Does the patient have difficulty concentrating, remembering, or making decisions?: No Patient able to express need for assistance with ADLs?: Yes Does the patient have difficulty dressing or bathing?: No Independently performs ADLs?: Yes (appropriate for developmental age) Does the patient have difficulty walking or climbing stairs?: No Weakness of Legs: None Weakness of Arms/Hands: None  Permission Sought/Granted                  Emotional Assessment              Admission diagnosis:  Pneumonia of both lungs due to infectious organism, unspecified part of lung [J18.9] Sepsis, due to unspecified organism, unspecified whether acute organ dysfunction present (HCC) [A41.9] COVID-19 [U07.1] Patient Active Problem List   Diagnosis Date Noted   Pulmonary embolism (HCC) 05/05/2021   COVID-19 05/09/2021   Essential hypertension 06/25/2018   PCP:  [name], MD Pharmacy:   [facility]  [address], [place] - [address] [place] [address] Phone: [phone] Fax: [phone]     Social Determinants of Health (SDOH) Interventions    Readmission Risk Interventions No flowsheet data found.

## 2021-05-06 NOTE — H&P (View-Only) (Signed)
[facility] Vascular Consult Note  MRN : [id number]  Jonathan Salinas is a 85 y.o. (08/22/1935) male who presents with chief complaint of  Chief Complaint  Patient presents with   Shortness of Breath   History of Present Illness:  This is an 85 year old male who presented to [facility] ER on 10/10 via EMS with c/o shortness of breath onset 09/29.  He was evaluated at [facility] on 10/3, and initially treated with doxycycline 100 mg bid for suspected bronchitis pending COVID-19 test results.  Pt is unvaccinated.  His COVID-19 results came back positive, however he was out of the window for antiviral treatment, and provider recommended supportive care.    On 10/10, pt reported each time he took the doxycycline as prescribed he vomited and he continued to have worsening shortness of breath, therefore EMS notified. EMS reported upon their arrival at pts home his O2 sats were 68% on RA.  EMS placed pt on NRB @15L, pt received  solumedrol and duonebs with O2 sats increasing to the 80's.   ED Course: Upon arrival to the ER pt remained hypoxic with O2 sats in the low to mid 80's requiring transition to 100% HHFNC @60 L/min.  CXR concerning for multifocal pneumonia.  Lab results revealed wbc 21.0 and fibrinogen 535.  Pt received cefepime and vancomycin.    CTA Chest (05/06/21):  Pulmonary embolus seen in the distal right main pulmonary artery, segmental right upper and lower lobe pulmonary arteries, and bilateral subsegmental pulmonary arteries. RV/LV ratio is greater than 1, which is suggestive of heart right heart strain. Correlate with echocardiography. Patchy, bilateral peripheral predominant ground-glass opacities, compatible with history of COVID-19 infection.  Vascular surgery was consulted by Dr. Kasa for possible vascular intervention in the setting of pulmonary embolism with right heart strain.  Current Facility-Administered Medications  Medication Dose Route  Frequency Provider Last Rate Last Admin   0.9 %  sodium chloride infusion  250 mL Intravenous PRN Kasa, Kurian, MD       acetaminophen (TYLENOL) tablet 650 mg  650 mg Oral Q4H PRN Kasa, Kurian, MD       albuterol (VENTOLIN HFA) 108 (90 Base) MCG/ACT inhaler 2 puff  2 puff Inhalation Q6H Graves, Dana E, NP   2 puff at 05/06/21 0825   ascorbic acid (VITAMIN C) tablet 500 mg  500 mg Oral Daily Graves, Dana E, NP   500 mg at 05/06/21 0826   baricitinib (OLUMIANT) tablet 4 mg  4 mg Oral Daily Wouk, Noah Bedford, MD   4 mg at 05/06/21 0822   Chlorhexidine Gluconate Cloth 2 % PADS 6 each  6 each Topical Q0600 Kasa, Kurian, MD   6 each at 05/06/21 0615   docusate sodium (COLACE) capsule 100 mg  100 mg Oral BID PRN Kasa, Kurian, MD       famotidine (PEPCID) tablet 20 mg  20 mg Oral BID Chappell, Alex B, RPH       feeding supplement (ENSURE ENLIVE / ENSURE PLUS) liquid 237 mL  237 mL Oral TID BM Wouk, Noah Bedford, MD   237 mL at 05/06/21 1055   guaiFENesin-dextromethorphan (ROBITUSSIN DM) 100-10 MG/5ML syrup 10 mL  10 mL Oral Q4H PRN Graves, Dana E, NP   10 mL at 05/13/2021 2040   heparin ADULT infusion 100 units/mL (25000 units/250mL)  1,550 Units/hr Intravenous Continuous Kasa, Kurian, MD 15.5 mL/hr at 05/05/21 1909 1,550 Units/hr at 05/05/21 1909   lisinopril (ZESTRIL) tablet   10 mg  10 mg Oral Daily Wouk, Wilfred Curtis, MD       MEDLINE mouth rinse  15 mL Mouth Rinse BID Kathrynn Running, MD   15 mL at 05/06/21 8182   methylPREDNISolone sodium succinate (SOLU-MEDROL) 125 mg/2 mL injection 60 mg  60 mg Intravenous Q12H Kathrynn Running, MD   60 mg at 05/06/21 9937   multivitamin with minerals tablet 1 tablet  1 tablet Oral Daily Lianne Cure, NP   1 tablet at 05/06/21 1696   ondansetron (ZOFRAN) injection 4 mg  4 mg Intravenous Q6H PRN Erin Fulling, MD       polyethylene glycol (MIRALAX / GLYCOLAX) packet 17 g  17 g Oral Daily PRN Erin Fulling, MD       sodium chloride flush (NS) 0.9 % injection 3 mL   3 mL Intravenous Q12H Erin Fulling, MD   3 mL at 05/06/21 0825   sodium chloride flush (NS) 0.9 % injection 3 mL  3 mL Intravenous PRN Erin Fulling, MD       Past Medical History:  Diagnosis Date   GERD (gastroesophageal reflux disease)    Hypertension    Past Surgical History:  Procedure Laterality Date   COLONOSCOPY  2010   cleared for 10 yrs   INNER EAR SURGERY     had a perforated eardrum   TONSILLECTOMY     Social History Social History   Tobacco Use   Smoking status: Former   Smokeless tobacco: Never  Building services engineer Use: Never used  Substance Use Topics   Alcohol use: No    Alcohol/week: 0.0 standard drinks   Drug use: No   Family History Family History  Problem Relation Age of Onset   Cancer Mother    Cancer Father    Prostate cancer Neg Hx    Bladder Cancer Neg Hx    Kidney cancer Neg Hx   Denies family history of peripheral artery disease, venous disease renal disease or bleeding/clotting disorders.  Allergies  Allergen Reactions   Penicillins     REVIEW OF SYSTEMS (Negative unless checked)  Constitutional: [] Weight loss  [x] Fever  [] Chills Cardiac: [] Chest pain   [] Chest pressure   [] Palpitations   [] Shortness of breath when laying flat   [x] Shortness of breath at rest   [x] Shortness of breath with exertion. Vascular:  [] Pain in legs with walking   [] Pain in legs at rest   [] Pain in legs when laying flat   [] Claudication   [] Pain in feet when walking  [] Pain in feet at rest  [] Pain in feet when laying flat   [] History of DVT   [] Phlebitis   [] Swelling in legs   [] Varicose veins   [] Non-healing ulcers Pulmonary:   [] Uses home oxygen   [] Productive cough   [] Hemoptysis   [] Wheeze  [] COPD   [] Asthma Neurologic:  [] Dizziness  [] Blackouts   [] Seizures   [] History of stroke   [] History of TIA  [] Aphasia   [] Temporary blindness   [] Dysphagia   [] Weakness or numbness in arms   [] Weakness or numbness in legs Musculoskeletal:  [x] Arthritis   [] Joint swelling    [] Joint pain   [] Low back pain Hematologic:  [] Easy bruising  [] Easy bleeding   [] Hypercoagulable state   [] Anemic  [] Hepatitis Gastrointestinal:  [] Blood in stool   [] Vomiting blood  [] Gastroesophageal reflux/heartburn   [] Difficulty swallowing. Genitourinary:  [] Chronic kidney disease   [] Difficult urination  [] Frequent urination  [] Burning with urination   []   Blood in urine Skin:  [] Rashes   [] Ulcers   [] Wounds Psychological:  [] History of anxiety   []  History of major depression.  Physical Examination  Vitals:   05/06/21 0700 05/06/21 0800 05/06/21 0900 05/06/21 1000  BP: (!) 143/83 (!) 144/79 (!) 155/81 (!) 155/85  Pulse: 81 71 85 (!) 161  Resp: (!) 21 20 (!) 23 (!) 30  Temp:      TempSrc:      SpO2: 96% 93% 93% 92%  Weight:      Height:       Body mass index is 21.7 kg/m. Gen:  WD/WN, NAD Head: [place]/AT, No temporalis wasting. Prominent temp pulse not noted. Ear/Nose/Throat: Hearing grossly intact, nares w/o erythema or drainage, oropharynx w/o Erythema/Exudate Eyes: Sclera non-icteric, conjunctiva clear Neck: Trachea midline.  No JVD.  Pulmonary: On supplemental nasal cannula.  Able to speak without getting significantly short of breath.  Decreased bilaterally. Cardiac: Slightly tachycardic at times Vascular:  Vessel Right Left  Radial Palpable Palpable  Ulnar Palpable Palpable   Gastrointestinal: soft, non-tender/non-distended. No guarding/reflex.  Musculoskeletal: M/S 5/5 throughout.  Extremities without ischemic changes.  No deformity or atrophy. No edema. Neurologic: Sensation grossly intact in extremities.  Symmetrical.  Speech is fluent. Motor exam as listed above. Psychiatric: Judgment intact, Mood & affect appropriate for pt's clinical situation. Dermatologic: No rashes or ulcers noted.  No cellulitis or open wounds. Lymph : No Cervical, Axillary, or Inguinal lymphadenopathy.  CBC Lab Results  Component Value Date   WBC 15.6 (H) 05/06/2021   HGB 11.6 (L)  05/06/2021   HCT 34.1 (L) 05/06/2021   MCV 91.9 05/06/2021   PLT 320 05/06/2021   BMET    Component Value Date/Time   NA 136 05/06/2021 0651   NA 141 07/29/2019 1438   K 4.4 05/06/2021 0651   CL 100 05/06/2021 0651   CO2 27 05/06/2021 0651   GLUCOSE 154 (H) 05/06/2021 0651   BUN 27 (H) 05/06/2021 0651   BUN 19 07/29/2019 1438   CREATININE 1.19 05/06/2021 0651   CALCIUM 7.8 (L) 05/06/2021 0651   GFRNONAA >60 05/06/2021 0651   GFRAA 50 (L) 07/29/2019 1438   Estimated Creatinine Clearance: 49.6 mL/min (by C-G formula based on SCr of 1.19 mg/dL).  COAG Lab Results  Component Value Date   INR 1.5 (H) 05-15-2021   Radiology DG Chest 2 View  Result Date: 04/26/2021 CLINICAL DATA:  Chest congestion, wheezing, and low-grade fever for the past 5 days. EXAM: CHEST - 2 VIEW COMPARISON:  Chest x-ray dated November 11, 2019. FINDINGS: The heart size and mediastinal contours are within normal limits. Mild left lower lobe atelectasis. No focal consolidation, pleural effusion, or pneumothorax. No acute osseous abnormality. IMPRESSION: 1. Mild left lower lobe atelectasis. Electronically Signed   By: Obie Dredge M.D.   On: 04/26/2021 14:56   CT Angio Chest Pulmonary Embolism (PE) W or WO Contrast  Result Date: 05/05/2021 CLINICAL DATA:  Evaluate for pulmonary embolus EXAM: CT ANGIOGRAPHY CHEST WITH CONTRAST TECHNIQUE: Multidetector CT imaging of the chest was performed using the standard protocol during bolus administration of intravenous contrast. Multiplanar CT image reconstructions and MIPs were obtained to evaluate the vascular anatomy. CONTRAST:  2mL OMNIPAQUE IOHEXOL 350 MG/ML SOLN COMPARISON:  None. FINDINGS: Cardiovascular: Adequate contrast opacification of the pulmonary arteries. Pulmonary embolus seen in the distal right main pulmonary artery, segmental right upper and lower lobe pulmonary arteries, and bilateral subsegmental pulmonary arteries. RV/LV ratio is greater than 1. Normal  heart  size. No pericardial effusion. No significant coronary artery calcifications. Atherosclerotic disease of the thoracic aorta. Mediastinum/Nodes: Small hiatal hernia. Thyroid is unremarkable. Prominent subcentimeter mediastinal lymph nodes which are likely reactive. Reference subcarinal lymph node measuring 8 mm in short axis on series 5, image 134. Lungs/Pleura: Central airways are patent. Bilateral patchy peripheral predominant ground-glass opacities. No pleural effusion or pneumothorax, Upper Abdomen: Low-density liver lesions, largest measuring up to 3.3 cm, which are likely simple cysts. No acute abnormality. Musculoskeletal: No chest wall abnormality. No acute or significant osseous findings. Review of the MIP images confirms the above findings. IMPRESSION: Pulmonary embolus seen in the distal right main pulmonary artery, segmental right upper and lower lobe pulmonary arteries, and bilateral subsegmental pulmonary arteries. RV/LV ratio is greater than 1, which is suggestive of heart right heart strain. Correlate with echocardiography. Patchy, bilateral peripheral predominant ground-glass opacities, compatible with history of COVID-19 infection. Aortic Atherosclerosis (ICD10-I70.0). Critical Value/emergent results were called by telephone at the time of interpretation on 05/05/2021 at 2:38 pm to RN Jayme Cloud, who verbally acknowledged these results and will relay the results to the hospitalist on call. Electronically Signed   By: Allegra Lai M.D.   On: 05/05/2021 14:39   DG Chest Port 1 View  Result Date: 05/02/2021 CLINICAL DATA:  Shortness of breath, weakness, COVID EXAM: PORTABLE CHEST 1 VIEW COMPARISON:  04/26/2021 FINDINGS: Patchy bilateral airspace disease, new since prior study compatible with multifocal pneumonia. Heart is normal size. No visible effusions or pneumothorax. Aortic atherosclerosis. No acute bony abnormality. IMPRESSION: New extensive patchy bilateral airspace disease  compatible with multifocal pneumonia. Electronically Signed   By: Charlett Nose M.D.   On: 05/06/2021 17:45   ECHOCARDIOGRAM COMPLETE  Result Date: 05/04/2021    ECHOCARDIOGRAM REPORT   Patient Name:   Jonathan Salinas Date of Exam: 05/04/2021 Medical Rec #:  [id number]        Height:       73.0 in Accession #:    [id number]       Weight:       162.3 lb Date of Birth:  21-Jan-1936        BSA:          1.969 m Patient Age:    85 years         BP:           128/88 mmHg Patient Gender: M                HR:           32 bpm. Exam Location:  [facility] Procedure: 2D Echo, Color Doppler and Cardiac Doppler Indications:     Acute respiratory distress R06.03  History:         Patient has no prior history of Echocardiogram examinations.                  Risk Factors:Hypertension.  Sonographer:     Cristela Blue Referring Phys:  [id number] DANA E GRAVES Diagnosing Phys: Cristal Deer End MD  Sonographer Comments: Suboptimal parasternal window and no subcostal window. IMPRESSIONS  1. Left ventricular ejection fraction, by estimation, is 55 to 60%. The left ventricle has normal function. Left ventricular endocardial border not optimally defined to evaluate regional wall motion. There is mild to moderate left ventricular hypertrophy. Left ventricular diastolic parameters are consistent with Grade I diastolic dysfunction (impaired relaxation).  2. Right ventricular systolic function is low normal. The right ventricular size is mildly enlarged.  3. The mitral valve  is abnormal. Mild to moderate mitral valve regurgitation. No evidence of mitral stenosis.  4. The aortic valve was not well visualized. Aortic valve regurgitation is not visualized. No aortic stenosis is present. FINDINGS  Left Ventricle: Left ventricular ejection fraction, by estimation, is 55 to 60%. The left ventricle has normal function. Left ventricular endocardial border not optimally defined to evaluate regional wall motion. The left ventricular internal cavity size was  normal in size. There is mild to moderate left ventricular hypertrophy. Left ventricular diastolic parameters are consistent with Grade I diastolic dysfunction (impaired relaxation). Right Ventricle: The right ventricular size is mildly enlarged. Right vetricular wall thickness was not well visualized. Right ventricular systolic function is low normal. Left Atrium: Left atrial size was normal in size. Right Atrium: Right atrial size was normal in size. Pericardium: The pericardium was not well visualized. Mitral Valve: The mitral valve is abnormal. Mild to moderate mitral valve regurgitation. No evidence of mitral valve stenosis. Tricuspid Valve: The tricuspid valve is not well visualized. Tricuspid valve regurgitation is not demonstrated. Aortic Valve: The aortic valve was not well visualized. Aortic valve regurgitation is not visualized. No aortic stenosis is present. Aortic valve mean gradient measures 2.0 mmHg. Aortic valve peak gradient measures 3.4 mmHg. Aortic valve area, by VTI measures 3.19 cm. Pulmonic Valve: The pulmonic valve was not well visualized. Aorta: The aortic root is normal in size and structure. Pulmonary Artery: The pulmonary artery is not well seen. Venous: The inferior vena cava was not well visualized. IAS/Shunts: The interatrial septum was not well visualized.  LEFT VENTRICLE PLAX 2D LVIDd:         3.80 cm   Diastology LVIDs:         2.70 cm   LV e' medial:    5.76 cm/s LV PW:         1.30 cm   LV E/e' medial:  9.3 LV IVS:        1.20 cm   LV e' lateral:   7.40 cm/s LVOT diam:     2.00 cm   LV E/e' lateral: 7.2 LV SV:         58 LV SV Index:   30 LVOT Area:     3.14 cm  RIGHT VENTRICLE RV Basal diam:  3.50 cm RV S prime:     11.88 cm/s TAPSE (M-mode): 1.6 cm LEFT ATRIUM           Index        RIGHT ATRIUM           Index LA diam:      4.40 cm 2.24 cm/m   RA Area:     13.80 cm LA Vol (A2C): 23.4 ml 11.89 ml/m  RA Volume:   29.90 ml  15.19 ml/m LA Vol (A4C): 35.7 ml 18.13 ml/m  AORTIC  VALVE AV Area (Vmax):    3.13 cm AV Area (Vmean):   2.87 cm AV Area (VTI):     3.19 cm AV Vmax:           91.80 cm/s AV Vmean:          69.500 cm/s AV VTI:            0.183 m AV Peak Grad:      3.4 mmHg AV Mean Grad:      2.0 mmHg LVOT Vmax:         91.40 cm/s LVOT Vmean:        63.400 cm/s LVOT VTI:  0.186 m LVOT/AV VTI ratio: 1.02  AORTA Ao Root diam: 2.90 cm MITRAL VALVE               TRICUSPID VALVE MV Area (PHT): 6.02 cm    TR Peak grad:   16.2 mmHg MV Decel Time: 126 msec    TR Vmax:        201.00 cm/s MV E velocity: 53.60 cm/s MV A velocity: 94.70 cm/s  SHUNTS MV E/A ratio:  0.57        Systemic VTI:  0.19 m                            Systemic Diam: 2.00 cm Yvonne Kendall MD Electronically signed by Yvonne Kendall MD Signature Date/Time: 05/04/2021/6:49:13 PM    Final     Assessment/Plan The patient is an 85 year old male without significant past medical history presented to the [facility] emergency department via EMS due to progressively worsening shortness of breath found to have COVID-19 and pulmonary embolism on CTA of the chest  1.  Pulmonary Embolism: Patient endorses a history of progressively worsening shortness of breath.  Patient initially was seen at an urgent care and was diagnosed with bronchitis and treated with doxycycline.  Patient opted to seek medical attention via EMS to our emergency department when his shortness of breath progressively worsen.  Patient was found to be COVID-19 positive.  CTA of the chest was notable for acute pulmonary embolism with right heart strain.  Patient was started on heparin and admitted to the hospital.  Patient with continued shortness of breath, slightly tachycardic at times, blood pressure is stable, satting in the lower 90s on nasal cannula.  With the presence of right heart strain, tachycardia progressively worsening shortness of breath recommend the patient undergo pulmonary thrombectomy and thrombolysis in an  attempt to lessen the clot burden, improve his symptoms as well as right heart strain.  Procedure, risks and benefits were explained to the patient.  All questions were answered.  Patient would like to proceed.  We will plan on this tomorrow with Dr. Gilda Crease.  2.  Rule out DVT: Physical exam is essentially unremarkable Asymptomatic to the lower extremity at this time Will order bilateral venous duplex just to rule out DVT in the setting of PE.  3.  COVID-19: Contact/airborne precautions Currently being treated with antivirals, steroids etc.  Discussed with Dr. Romie Jumper, PA-C 05/06/2021 12:06 PM  This note was created with Dragon medical transcription system.  Any error is purely unintentional.

## 2021-05-06 NOTE — Progress Notes (Signed)
PROGRESS NOTE    JADORE VEALS  JIR:[id number] DOB: 03/18/1936 DOA: May 06, 2021 PCP: [name], MD  Outpatient Specialists: none    Brief Narrative:   From admission h and p: This is an 85 yo male who presented to [facility] ER on 10/10 via EMS with c/o shortness of breath onset 09/29.Marland Kitchen  He was evaluated at [facility] on 10/3, and initially treated with doxycycline 100 mg bid for suspected bronchitis pending COVID-19 test results.  Pt is unvaccinated.  His COVID-19 results came back positive, however he was out of the window for antiviral treatment, and provider recommended supportive care.  On 10/10 pt reported each time he took the doxycycline as prescribed he vomited and he continued to have worsening shortness of breath, therefore EMS notified. EMS reported upon their arrival at pts home his O2 sats were 68% on RA.  EMS placed pt on NRB @15L , pt received  solumedrol and duonebs with O2 sats increasing to the 80's.   ED Course: Upon arrival to the ER pt remained hypoxic with O2 sats in the low to mid 80's requiring transition to 100% HHFNC @60  L/min.  CXR concerning for multifocal pneumonia.  Lab results revealed wbc 21.0 and fibrinogen 535.  Pt received cefepime and vancomycin.  PCCM team contacted for ICU admission.        Assessment & Plan:   Active Problems:   COVID-19   Pulmonary embolism (HCC)  # Acute hypoxic respiratory failure # Covid pneumonia O2 80s on arrival, initially requiring 100% hfnc @ 60. Now weaned down to 12 L HFNC. Symptoms began 9/29. Treated w/ doxycycline as outpatient. Not vaccinated. - continue methylpred (started 10/10), will plan to taper over the next couple of days if respiratory status continues to improve. - started baricitinib (start 10/12), plan for up to 14 days - stopped cefepime, no evidence pneumonia  # Pulmonary embolism Significant right-sided PE. Evidence right heart strain; TTE shows low-normal RV function - vascular  consulted, will see patient today, they are considering thrombectomy - continue heparin IV for now  # HTN Here bp elevated - resume home lisinopril   DVT prophylaxis: heparin Code Status: full Family Communication: son 10/29 updated telephonically 10/13  Level of care: Stepdown Status is: Inpatient  Remains inpatient appropriate because:Inpatient level of care appropriate due to severity of illness  Dispo: The patient is from: Home              Anticipated d/c is to: Home              Patient currently is not medically stable to d/c.   Difficult to place patient No     Consultants:  none  Procedures: none  Antimicrobials:  S/p cefepime    Subjective: This morning breathing a little big better. No chest pain.  Objective: Vitals:   05/06/21 0700 05/06/21 0800 05/06/21 0900 05/06/21 1000  BP: (!) 143/83 (!) 144/79 (!) 155/81 (!) 155/85  Pulse: 81 71 85 (!) 161  Resp: (!) 21 20 (!) 23 (!) 30  Temp:      TempSrc:      SpO2: 96% 93% 93% 92%  Weight:      Height:        Intake/Output Summary (Last 24 hours) at 05/06/2021 1115 Last data filed at 05/06/2021 1100 Gross per 24 hour  Intake 461.94 ml  Output 1550 ml  Net -1088.06 ml   Filed Weights   05/04/21 0451 05/05/21 0401 05/06/21 0500  Weight: 73.6 kg 73 kg 74.6 kg    Examination:  General exam: Appears calm and comfortable  Respiratory system: scattered rhonchi and rales Cardiovascular system: S1 & S2 heard, RRR. No JVD, murmurs, rubs, gallops or clicks. No pedal edema. Gastrointestinal system: Abdomen is nondistended, soft and nontender. No organomegaly or masses felt. Normal bowel sounds heard. Central nervous system: Alert and oriented. No focal neurological deficits. Extremities: Symmetric 5 x 5 power. Skin: No rashes, lesions or ulcers Psychiatry: Judgement and insight appear normal. Mood & affect appropriate.     Data Reviewed: I have personally reviewed following labs and imaging  studies  CBC: Recent Labs  Lab 04/26/2021 1719 05/04/21 0605 05/05/21 0054 05/06/21 0651  WBC 21.0* 14.8* 19.0* 15.6*  NEUTROABS 18.1* 13.4* 17.1* 14.1*  HGB 14.1 12.2* 11.0* 11.6*  HCT 41.3 34.0* 31.5* 34.1*  MCV 91.8 90.2 92.1 91.9  PLT 315 254 278 320   Basic Metabolic Panel: Recent Labs  Lab 05/20/2021 1719 05/15/2021 1746 05/04/21 0605 05/05/21 0054 05/06/21 0651  NA 137  --  136 134* 136  K 3.4*  --  3.8 3.4* 4.4  CL 101  --  103 98 100  CO2 15*  --  23 26 27   GLUCOSE 184*  --  129* 133* 154*  BUN 34*  --  35* 32* 27*  CREATININE 1.59* 1.52* 1.27* 1.06 1.19  CALCIUM 8.5*  --  8.0* 7.0* 7.8*  MG  --  2.2 2.2 2.2 2.5*  PHOS  --  4.1 4.0 2.9  --    GFR: Estimated Creatinine Clearance: 49.6 mL/min (by C-G formula based on SCr of 1.19 mg/dL). Liver Function Tests: Recent Labs  Lab 05/17/2021 1719 05/04/21 0605 05/05/21 0054 05/06/21 0651  AST 55* 27 42* 28  ALT 32 23 26 24   ALKPHOS 73 57 49 50  BILITOT 1.1 1.0 0.7 0.7  PROT 7.5 6.2* 5.4* 5.9*  ALBUMIN 2.4* 1.9* 1.7* 1.9*   No results for input(s): LIPASE, AMYLASE in the last 168 hours. No results for input(s): AMMONIA in the last 168 hours. Coagulation Profile: Recent Labs  Lab 04/27/2021 1940  INR 1.5*   Cardiac Enzymes: No results for input(s): CKTOTAL, CKMB, CKMBINDEX, TROPONINI in the last 168 hours. BNP (last 3 results) No results for input(s): PROBNP in the last 8760 hours. HbA1C: Recent Labs    05/04/21 0605  HGBA1C 5.5   CBG: Recent Labs  Lab 05/04/21 1551 05/04/21 1912 05/04/21 2311 05/05/21 0301 05/05/21 0751  GLUCAP 147* 133* 118* 124* 122*   Lipid Profile: Recent Labs    05/04/2021 1746  TRIG 156*   Thyroid Function Tests: Recent Labs    05/02/2021 1719  TSH 0.820  FREET4 1.64*   Anemia Panel: Recent Labs    05/04/21 0605 05/05/21 0054  FERRITIN 621* 616*   Urine analysis:    Component Value Date/Time   BILIRUBINUR neg 11/20/2015 1511   PROTEINUR neg 11/20/2015 1511    UROBILINOGEN 0.2 11/20/2015 1511   NITRITE neg 11/20/2015 1511   LEUKOCYTESUR Negative 11/20/2015 1511   Sepsis Labs: @LABRCNTIP (procalcitonin:4,lacticidven:4)  ) Recent Results (from the past 240 hour(s))  SARS CORONAVIRUS 2 (TAT 6-24 HRS) Nasopharyngeal Nasopharyngeal Swab     Status: Abnormal   Collection Time: 04/26/21  1:50 PM   Specimen: Nasopharyngeal Swab  Result Value Ref Range Status   SARS Coronavirus 2 POSITIVE (A) NEGATIVE Final    Comment: (NOTE) SARS-CoV-2 target nucleic acids are DETECTED.  The SARS-CoV-2 RNA is generally detectable  in upper and lower respiratory specimens during the acute phase of infection. Positive results are indicative of the presence of SARS-CoV-2 RNA. Clinical correlation with patient history and other diagnostic information is  necessary to determine patient infection status. Positive results do not rule out bacterial infection or co-infection with other viruses.  The expected result is Negative.  Fact Sheet for Patients: HairSlick.no  Fact Sheet for Healthcare Providers: quierodirigir.com  This test is not yet approved or cleared by the [place] FDA and  has been authorized for detection and/or diagnosis of SARS-CoV-2 by FDA under an Emergency Use Authorization (EUA). This EUA will remain  in effect (meaning this test can be used) for the duration of the COVID-19 declaration under Section 564(b)(1) of the Act, 21 U. S.C. section 360bbb-3(b)(1), unless the authorization is terminated or revoked sooner.   Performed at [facility], [address], [place], [place] [address]   Blood Culture (routine x 2)     Status: None (Preliminary result)   Collection Time: 05/29/2021  5:33 PM   Specimen: BLOOD  Result Value Ref Range Status   Specimen Description BLOOD BLOOD LEFT FOREARM  Final   Special Requests   Final    BOTTLES DRAWN AEROBIC AND ANAEROBIC Blood Culture  adequate volume   Culture   Final    NO GROWTH 3 DAYS Performed at [facility], [address], [place], [place] [address]    Report Status PENDING  Incomplete  Blood Culture (routine x 2)     Status: None (Preliminary result)   Collection Time: 06/02/2021  5:40 PM   Specimen: BLOOD  Result Value Ref Range Status   Specimen Description BLOOD BLOOD RIGHT FOREARM  Final   Special Requests   Final    BOTTLES DRAWN AEROBIC AND ANAEROBIC Blood Culture adequate volume   Culture   Final    NO GROWTH 3 DAYS Performed at [facility], [address] [place], [place] [address]    Report Status PENDING  Incomplete  MRSA Next Gen by PCR, Nasal     Status: None   Collection Time: 25-May-2021  7:07 PM   Specimen: Nasal Mucosa; Nasal Swab  Result Value Ref Range Status   MRSA by PCR Next Gen NOT DETECTED NOT DETECTED Final    Comment: (NOTE) The GeneXpert MRSA Assay (FDA approved for NASAL specimens only), is one component of a comprehensive MRSA colonization surveillance program. It is not intended to diagnose MRSA infection nor to guide or monitor treatment for MRSA infections. Test performance is not FDA approved in patients less than 33 years old. Performed at [facility], [address] [place], [place] [address]          Radiology Studies: CT Angio Chest Pulmonary Embolism (PE) W or WO Contrast  Result Date: 05/05/2021 CLINICAL DATA:  Evaluate for pulmonary embolus EXAM: CT ANGIOGRAPHY CHEST WITH CONTRAST TECHNIQUE: Multidetector CT imaging of the chest was performed using the standard protocol during bolus administration of intravenous contrast. Multiplanar CT image reconstructions and MIPs were obtained to evaluate the vascular anatomy. CONTRAST:  96mL OMNIPAQUE IOHEXOL 350 MG/ML SOLN COMPARISON:  None. FINDINGS: Cardiovascular: Adequate contrast opacification of the pulmonary arteries. Pulmonary embolus seen in the distal right main pulmonary artery,  segmental right upper and lower lobe pulmonary arteries, and bilateral subsegmental pulmonary arteries. RV/LV ratio is greater than 1. Normal heart size. No pericardial effusion. No significant coronary artery calcifications. Atherosclerotic disease of the thoracic aorta. Mediastinum/Nodes: Small  hiatal hernia. Thyroid is unremarkable. Prominent subcentimeter mediastinal lymph nodes which are likely reactive. Reference subcarinal lymph node measuring 8 mm in short axis on series 5, image 134. Lungs/Pleura: Central airways are patent. Bilateral patchy peripheral predominant ground-glass opacities. No pleural effusion or pneumothorax, Upper Abdomen: Low-density liver lesions, largest measuring up to 3.3 cm, which are likely simple cysts. No acute abnormality. Musculoskeletal: No chest wall abnormality. No acute or significant osseous findings. Review of the MIP images confirms the above findings. IMPRESSION: Pulmonary embolus seen in the distal right main pulmonary artery, segmental right upper and lower lobe pulmonary arteries, and bilateral subsegmental pulmonary arteries. RV/LV ratio is greater than 1, which is suggestive of heart right heart strain. Correlate with echocardiography. Patchy, bilateral peripheral predominant ground-glass opacities, compatible with history of COVID-19 infection. Aortic Atherosclerosis (ICD10-I70.0). Critical Value/emergent results were called by telephone at the time of interpretation on 05/05/2021 at 2:38 pm to RN Jayme Cloud, who verbally acknowledged these results and will relay the results to the hospitalist on call. Electronically Signed   By: Allegra Lai M.D.   On: 05/05/2021 14:39   ECHOCARDIOGRAM COMPLETE  Result Date: 05/04/2021    ECHOCARDIOGRAM REPORT   Patient Name:   AVI TROTTI Date of Exam: 05/04/2021 Medical Rec #:  [id number]        Height:       73.0 in Accession #:    [id number]       Weight:       162.3 lb Date of Birth:  January 26, 1936        BSA:           1.969 m Patient Age:    85 years         BP:           128/88 mmHg Patient Gender: M                HR:           32 bpm. Exam Location:  [facility] Procedure: 2D Echo, Color Doppler and Cardiac Doppler Indications:     Acute respiratory distress R06.03  History:         Patient has no prior history of Echocardiogram examinations.                  Risk Factors:Hypertension.  Sonographer:     Cristela Blue Referring Phys:  [id number] DANA E GRAVES Diagnosing Phys: Cristal Deer End MD  Sonographer Comments: Suboptimal parasternal window and no subcostal window. IMPRESSIONS  1. Left ventricular ejection fraction, by estimation, is 55 to 60%. The left ventricle has normal function. Left ventricular endocardial border not optimally defined to evaluate regional wall motion. There is mild to moderate left ventricular hypertrophy. Left ventricular diastolic parameters are consistent with Grade I diastolic dysfunction (impaired relaxation).  2. Right ventricular systolic function is low normal. The right ventricular size is mildly enlarged.  3. The mitral valve is abnormal. Mild to moderate mitral valve regurgitation. No evidence of mitral stenosis.  4. The aortic valve was not well visualized. Aortic valve regurgitation is not visualized. No aortic stenosis is present. FINDINGS  Left Ventricle: Left ventricular ejection fraction, by estimation, is 55 to 60%. The left ventricle has normal function. Left ventricular endocardial border not optimally defined to evaluate regional wall motion. The left ventricular internal cavity size was normal in size. There is mild to moderate left ventricular hypertrophy. Left ventricular diastolic parameters are consistent with Grade I diastolic dysfunction (impaired relaxation). Right  Ventricle: The right ventricular size is mildly enlarged. Right vetricular wall thickness was not well visualized. Right ventricular systolic function is low normal. Left Atrium: Left atrial size was normal in size.  Right Atrium: Right atrial size was normal in size. Pericardium: The pericardium was not well visualized. Mitral Valve: The mitral valve is abnormal. Mild to moderate mitral valve regurgitation. No evidence of mitral valve stenosis. Tricuspid Valve: The tricuspid valve is not well visualized. Tricuspid valve regurgitation is not demonstrated. Aortic Valve: The aortic valve was not well visualized. Aortic valve regurgitation is not visualized. No aortic stenosis is present. Aortic valve mean gradient measures 2.0 mmHg. Aortic valve peak gradient measures 3.4 mmHg. Aortic valve area, by VTI measures 3.19 cm. Pulmonic Valve: The pulmonic valve was not well visualized. Aorta: The aortic root is normal in size and structure. Pulmonary Artery: The pulmonary artery is not well seen. Venous: The inferior vena cava was not well visualized. IAS/Shunts: The interatrial septum was not well visualized.  LEFT VENTRICLE PLAX 2D LVIDd:         3.80 cm   Diastology LVIDs:         2.70 cm   LV e' medial:    5.76 cm/s LV PW:         1.30 cm   LV E/e' medial:  9.3 LV IVS:        1.20 cm   LV e' lateral:   7.40 cm/s LVOT diam:     2.00 cm   LV E/e' lateral: 7.2 LV SV:         58 LV SV Index:   30 LVOT Area:     3.14 cm  RIGHT VENTRICLE RV Basal diam:  3.50 cm RV S prime:     11.88 cm/s TAPSE (M-mode): 1.6 cm LEFT ATRIUM           Index        RIGHT ATRIUM           Index LA diam:      4.40 cm 2.24 cm/m   RA Area:     13.80 cm LA Vol (A2C): 23.4 ml 11.89 ml/m  RA Volume:   29.90 ml  15.19 ml/m LA Vol (A4C): 35.7 ml 18.13 ml/m  AORTIC VALVE AV Area (Vmax):    3.13 cm AV Area (Vmean):   2.87 cm AV Area (VTI):     3.19 cm AV Vmax:           91.80 cm/s AV Vmean:          69.500 cm/s AV VTI:            0.183 m AV Peak Grad:      3.4 mmHg AV Mean Grad:      2.0 mmHg LVOT Vmax:         91.40 cm/s LVOT Vmean:        63.400 cm/s LVOT VTI:          0.186 m LVOT/AV VTI ratio: 1.02  AORTA Ao Root diam: 2.90 cm MITRAL VALVE                TRICUSPID VALVE MV Area (PHT): 6.02 cm    TR Peak grad:   16.2 mmHg MV Decel Time: 126 msec    TR Vmax:        201.00 cm/s MV E velocity: 53.60 cm/s MV A velocity: 94.70 cm/s  SHUNTS MV E/A ratio:  0.57  Systemic VTI:  0.19 m                            Systemic Diam: 2.00 cm Yvonne Kendall MD Electronically signed by Yvonne Kendall MD Signature Date/Time: 05/04/2021/6:49:13 PM    Final         Scheduled Meds:  albuterol  2 puff Inhalation Q6H   vitamin C  500 mg Oral Daily   baricitinib  4 mg Oral Daily   Chlorhexidine Gluconate Cloth  6 each Topical Q0600   famotidine  20 mg Oral BID   feeding supplement  237 mL Oral TID BM   mouth rinse  15 mL Mouth Rinse BID   methylPREDNISolone (SOLU-MEDROL) injection  60 mg Intravenous Q12H   multivitamin with minerals  1 tablet Oral Daily   sodium chloride flush  3 mL Intravenous Q12H   Continuous Infusions:  sodium chloride     heparin 1,550 Units/hr (05/05/21 1909)     LOS: 3 days    Time spent: 30 min    Silvano Bilis, MD [facility]   If 7PM-7AM, please contact night-coverage [email or web address] Password [email or web address] 05/06/2021, 11:15 AM

## 2021-05-06 NOTE — Progress Notes (Signed)
PHARMACIST - PHYSICIAN COMMUNICATION  CONCERNING: IV to Oral Route Change Policy  RECOMMENDATION: This patient is receiving famotidine by the intravenous route.  Based on criteria approved by the Pharmacy and Therapeutics Committee, the intravenous medication(s) is/are being converted to the equivalent oral dose form(s).   DESCRIPTION: These criteria include: The patient is eating (either orally or via tube) and/or has been taking other orally administered medications for a least 24 hours The patient has no evidence of active gastrointestinal bleeding or impaired GI absorption (gastrectomy, short bowel, patient on TNA or NPO).  If you have questions about this conversion, please contact the Pharmacy Department   [name], [facility] 05/06/2021 9:17 AM

## 2021-05-06 NOTE — Consult Note (Signed)
[facility] Vascular Consult Note  MRN : [id number]  Jonathan Salinas is a 85 y.o. (1936-06-02) male who presents with chief complaint of  Chief Complaint  Patient presents with   Shortness of Breath   History of Present Illness:  This is an 85 year old male who presented to [facility] on 10/10 via EMS with c/o shortness of breath onset 09/29.  He was evaluated at [facility] on 10/3, and initially treated with doxycycline 100 mg bid for suspected bronchitis pending COVID-19 test results.  Pt is unvaccinated.  His COVID-19 results came back positive, however he was out of the window for antiviral treatment, and provider recommended supportive care.    On 10/10, pt reported each time he took the doxycycline as prescribed he vomited and he continued to have worsening shortness of breath, therefore EMS notified. EMS reported upon their arrival at pts home his O2 sats were 68% on RA.  EMS placed pt on NRB , pt received  solumedrol and duonebs with O2 sats increasing to the 80's.   ED Course: Upon arrival to the ER pt remained hypoxic with O2 sats in the low to mid 80's requiring transition to 100% HHFNC  L/min.  CXR concerning for multifocal pneumonia.  Lab results revealed wbc 21.0 and fibrinogen 535.  Pt received cefepime and vancomycin.    CTA Chest (05/06/21):  Pulmonary embolus seen in the distal right main pulmonary artery, segmental right upper and lower lobe pulmonary arteries, and bilateral subsegmental pulmonary arteries. RV/LV ratio is greater than 1, which is suggestive of heart right heart strain. Correlate with echocardiography. Patchy, bilateral peripheral predominant ground-glass opacities, compatible with history of COVID-19 infection.  Vascular surgery was consulted by Dr. Belia Heman for possible vascular intervention in the setting of pulmonary embolism with right heart strain.  Current Facility-Administered Medications  Medication Dose Route  Frequency Provider Last Rate Last Admin   0.9 %  sodium chloride infusion  250 mL Intravenous PRN Erin Fulling, MD       acetaminophen (TYLENOL) tablet 650 mg  650 mg Oral Q4H PRN Erin Fulling, MD       albuterol (VENTOLIN HFA) 108 (90 Base) MCG/ACT inhaler 2 puff  2 puff Inhalation Q6H Graves, Dana E, NP   2 puff at 05/06/21 0825   ascorbic acid (VITAMIN C) tablet 500 mg  500 mg Oral Daily Lianne Cure, NP   500 mg at 05/06/21 4098   baricitinib (OLUMIANT) tablet 4 mg  4 mg Oral Daily Kathrynn Running, MD   4 mg at 05/06/21 1191   Chlorhexidine Gluconate Cloth 2 % PADS 6 each  6 each Topical [id number] Erin Fulling, MD   6 each at 05/06/21 0615   docusate sodium (COLACE) capsule 100 mg  100 mg Oral BID PRN Erin Fulling, MD       famotidine (PEPCID) tablet 20 mg  20 mg Oral BID [name], RPH       feeding supplement (ENSURE ENLIVE / ENSURE PLUS) liquid 237 mL  237 mL Oral TID BM Wouk, Wilfred Curtis, MD   237 mL at 05/06/21 1055   guaiFENesin-dextromethorphan (ROBITUSSIN DM) 100-10 MG/5ML syrup 10 mL  10 mL Oral Q4H PRN Lianne Cure, NP   10 mL at 05/23/2021 2040   heparin ADULT infusion 100 units/mL (25000 units/24mL)  1,550 Units/hr Intravenous Continuous Erin Fulling, MD 15.5 mL/hr at 05/05/21 1909 1,550 Units/hr at 05/05/21 1909   lisinopril (ZESTRIL) tablet  10 mg  10 mg Oral Daily Wouk, Wilfred Curtis, MD       MEDLINE mouth rinse  15 mL Mouth Rinse BID Kathrynn Running, MD   15 mL at 05/06/21 8182   methylPREDNISolone sodium succinate (SOLU-MEDROL) 125 mg/2 mL injection 60 mg  60 mg Intravenous Q12H Kathrynn Running, MD   60 mg at 05/06/21 9937   multivitamin with minerals tablet 1 tablet  1 tablet Oral Daily Lianne Cure, NP   1 tablet at 05/06/21 1696   ondansetron (ZOFRAN) injection 4 mg  4 mg Intravenous Q6H PRN Erin Fulling, MD       polyethylene glycol (MIRALAX / GLYCOLAX) packet 17 g  17 g Oral Daily PRN Erin Fulling, MD       sodium chloride flush (NS) 0.9 % injection 3 mL   3 mL Intravenous Q12H Erin Fulling, MD   3 mL at 05/06/21 0825   sodium chloride flush (NS) 0.9 % injection 3 mL  3 mL Intravenous PRN Erin Fulling, MD       Past Medical History:  Diagnosis Date   GERD (gastroesophageal reflux disease)    Hypertension    Past Surgical History:  Procedure Laterality Date   COLONOSCOPY  2010   cleared for 10 yrs   INNER EAR SURGERY     had a perforated eardrum   TONSILLECTOMY     Social History Social History   Tobacco Use   Smoking status: Former   Smokeless tobacco: Never  Building services engineer Use: Never used  Substance Use Topics   Alcohol use: No    Alcohol/week: 0.0 standard drinks   Drug use: No   Family History Family History  Problem Relation Age of Onset   Cancer Mother    Cancer Father    Prostate cancer Neg Hx    Bladder Cancer Neg Hx    Kidney cancer Neg Hx   Denies family history of peripheral artery disease, venous disease renal disease or bleeding/clotting disorders.  Allergies  Allergen Reactions   Penicillins     REVIEW OF SYSTEMS (Negative unless checked)  Constitutional: [] Weight loss  [x] Fever  [] Chills Cardiac: [] Chest pain   [] Chest pressure   [] Palpitations   [] Shortness of breath when laying flat   [x] Shortness of breath at rest   [x] Shortness of breath with exertion. Vascular:  [] Pain in legs with walking   [] Pain in legs at rest   [] Pain in legs when laying flat   [] Claudication   [] Pain in feet when walking  [] Pain in feet at rest  [] Pain in feet when laying flat   [] History of DVT   [] Phlebitis   [] Swelling in legs   [] Varicose veins   [] Non-healing ulcers Pulmonary:   [] Uses home oxygen   [] Productive cough   [] Hemoptysis   [] Wheeze  [] COPD   [] Asthma Neurologic:  [] Dizziness  [] Blackouts   [] Seizures   [] History of stroke   [] History of TIA  [] Aphasia   [] Temporary blindness   [] Dysphagia   [] Weakness or numbness in arms   [] Weakness or numbness in legs Musculoskeletal:  [x] Arthritis   [] Joint swelling    [] Joint pain   [] Low back pain Hematologic:  [] Easy bruising  [] Easy bleeding   [] Hypercoagulable state   [] Anemic  [] Hepatitis Gastrointestinal:  [] Blood in stool   [] Vomiting blood  [] Gastroesophageal reflux/heartburn   [] Difficulty swallowing. Genitourinary:  [] Chronic kidney disease   [] Difficult urination  [] Frequent urination  [] Burning with urination   []   Blood in urine Skin:  [] Rashes   [] Ulcers   [] Wounds Psychological:  [] History of anxiety   []  History of major depression.  Physical Examination  Vitals:   05/06/21 0700 05/06/21 0800 05/06/21 0900 05/06/21 1000  BP: (!) 143/83 (!) 144/79 (!) 155/81 (!) 155/85  Pulse: 81 71 85 (!) 161  Resp: (!) 21 20 (!) 23 (!) 30  Temp:      TempSrc:      SpO2: 96% 93% 93% 92%  Weight:      Height:       Body mass index is 21.7 kg/m. Gen:  WD/WN, NAD Head: [place]/AT, No temporalis wasting. Prominent temp pulse not noted. Ear/Nose/Throat: Hearing grossly intact, nares w/o erythema or drainage, oropharynx w/o Erythema/Exudate Eyes: Sclera non-icteric, conjunctiva clear Neck: Trachea midline.  No JVD.  Pulmonary: On supplemental nasal cannula.  Able to speak without getting significantly short of breath.  Decreased bilaterally. Cardiac: Slightly tachycardic at times Vascular:  Vessel Right Left  Radial Palpable Palpable  Ulnar Palpable Palpable   Gastrointestinal: soft, non-tender/non-distended. No guarding/reflex.  Musculoskeletal: M/S 5/5 throughout.  Extremities without ischemic changes.  No deformity or atrophy. No edema. Neurologic: Sensation grossly intact in extremities.  Symmetrical.  Speech is fluent. Motor exam as listed above. Psychiatric: Judgment intact, Mood & affect appropriate for pt's clinical situation. Dermatologic: No rashes or ulcers noted.  No cellulitis or open wounds. Lymph : No Cervical, Axillary, or Inguinal lymphadenopathy.  CBC Lab Results  Component Value Date   WBC 15.6 (H) 05/06/2021   HGB 11.6 (L)  05/06/2021   HCT 34.1 (L) 05/06/2021   MCV 91.9 05/06/2021   PLT 320 05/06/2021   BMET    Component Value Date/Time   NA 136 05/06/2021 0651   NA 141 07/29/2019 1438   K 4.4 05/06/2021 0651   CL 100 05/06/2021 0651   CO2 27 05/06/2021 0651   GLUCOSE 154 (H) 05/06/2021 0651   BUN 27 (H) 05/06/2021 0651   BUN 19 07/29/2019 1438   CREATININE 1.19 05/06/2021 0651   CALCIUM 7.8 (L) 05/06/2021 0651   GFRNONAA >60 05/06/2021 0651   GFRAA 50 (L) 07/29/2019 1438   Estimated Creatinine Clearance: 49.6 mL/min (by C-G formula based on SCr of 1.19 mg/dL).  COAG Lab Results  Component Value Date   INR 1.5 (H) 05-15-2021   Radiology DG Chest 2 View  Result Date: 04/26/2021 CLINICAL DATA:  Chest congestion, wheezing, and low-grade fever for the past 5 days. EXAM: CHEST - 2 VIEW COMPARISON:  Chest x-ray dated November 11, 2019. FINDINGS: The heart size and mediastinal contours are within normal limits. Mild left lower lobe atelectasis. No focal consolidation, pleural effusion, or pneumothorax. No acute osseous abnormality. IMPRESSION: 1. Mild left lower lobe atelectasis. Electronically Signed   By: Obie Dredge M.D.   On: 04/26/2021 14:56   CT Angio Chest Pulmonary Embolism (PE) W or WO Contrast  Result Date: 05/05/2021 CLINICAL DATA:  Evaluate for pulmonary embolus EXAM: CT ANGIOGRAPHY CHEST WITH CONTRAST TECHNIQUE: Multidetector CT imaging of the chest was performed using the standard protocol during bolus administration of intravenous contrast. Multiplanar CT image reconstructions and MIPs were obtained to evaluate the vascular anatomy. CONTRAST:  2mL OMNIPAQUE IOHEXOL 350 MG/ML SOLN COMPARISON:  None. FINDINGS: Cardiovascular: Adequate contrast opacification of the pulmonary arteries. Pulmonary embolus seen in the distal right main pulmonary artery, segmental right upper and lower lobe pulmonary arteries, and bilateral subsegmental pulmonary arteries. RV/LV ratio is greater than 1. Normal  heart  size. No pericardial effusion. No significant coronary artery calcifications. Atherosclerotic disease of the thoracic aorta. Mediastinum/Nodes: Small hiatal hernia. Thyroid is unremarkable. Prominent subcentimeter mediastinal lymph nodes which are likely reactive. Reference subcarinal lymph node measuring 8 mm in short axis on series 5, image 134. Lungs/Pleura: Central airways are patent. Bilateral patchy peripheral predominant ground-glass opacities. No pleural effusion or pneumothorax, Upper Abdomen: Low-density liver lesions, largest measuring up to 3.3 cm, which are likely simple cysts. No acute abnormality. Musculoskeletal: No chest wall abnormality. No acute or significant osseous findings. Review of the MIP images confirms the above findings. IMPRESSION: Pulmonary embolus seen in the distal right main pulmonary artery, segmental right upper and lower lobe pulmonary arteries, and bilateral subsegmental pulmonary arteries. RV/LV ratio is greater than 1, which is suggestive of heart right heart strain. Correlate with echocardiography. Patchy, bilateral peripheral predominant ground-glass opacities, compatible with history of COVID-19 infection. Aortic Atherosclerosis (ICD10-I70.0). Critical Value/emergent results were called by telephone at the time of interpretation on 05/05/2021 at 2:38 pm to RN Jayme Cloud, who verbally acknowledged these results and will relay the results to the hospitalist on call. Electronically Signed   By: Allegra Lai M.D.   On: 05/05/2021 14:39   DG Chest Port 1 View  Result Date: 05/02/2021 CLINICAL DATA:  Shortness of breath, weakness, COVID EXAM: PORTABLE CHEST 1 VIEW COMPARISON:  04/26/2021 FINDINGS: Patchy bilateral airspace disease, new since prior study compatible with multifocal pneumonia. Heart is normal size. No visible effusions or pneumothorax. Aortic atherosclerosis. No acute bony abnormality. IMPRESSION: New extensive patchy bilateral airspace disease  compatible with multifocal pneumonia. Electronically Signed   By: Charlett Nose M.D.   On: 05/14/2021 17:45   ECHOCARDIOGRAM COMPLETE  Result Date: 05/04/2021    ECHOCARDIOGRAM REPORT   Patient Name:   Jonathan Salinas Date of Exam: 05/04/2021 Medical Rec #:  [id number]        Height:       73.0 in Accession #:    [id number]       Weight:       162.3 lb Date of Birth:  21-Jan-1936        BSA:          1.969 m Patient Age:    85 years         BP:           128/88 mmHg Patient Gender: M                HR:           32 bpm. Exam Location:  [facility] Procedure: 2D Echo, Color Doppler and Cardiac Doppler Indications:     Acute respiratory distress R06.03  History:         Patient has no prior history of Echocardiogram examinations.                  Risk Factors:Hypertension.  Sonographer:     Cristela Blue Referring Phys:  [id number] DANA E GRAVES Diagnosing Phys: Cristal Deer End MD  Sonographer Comments: Suboptimal parasternal window and no subcostal window. IMPRESSIONS  1. Left ventricular ejection fraction, by estimation, is 55 to 60%. The left ventricle has normal function. Left ventricular endocardial border not optimally defined to evaluate regional wall motion. There is mild to moderate left ventricular hypertrophy. Left ventricular diastolic parameters are consistent with Grade I diastolic dysfunction (impaired relaxation).  2. Right ventricular systolic function is low normal. The right ventricular size is mildly enlarged.  3. The mitral valve  is abnormal. Mild to moderate mitral valve regurgitation. No evidence of mitral stenosis.  4. The aortic valve was not well visualized. Aortic valve regurgitation is not visualized. No aortic stenosis is present. FINDINGS  Left Ventricle: Left ventricular ejection fraction, by estimation, is 55 to 60%. The left ventricle has normal function. Left ventricular endocardial border not optimally defined to evaluate regional wall motion. The left ventricular internal cavity size was  normal in size. There is mild to moderate left ventricular hypertrophy. Left ventricular diastolic parameters are consistent with Grade I diastolic dysfunction (impaired relaxation). Right Ventricle: The right ventricular size is mildly enlarged. Right vetricular wall thickness was not well visualized. Right ventricular systolic function is low normal. Left Atrium: Left atrial size was normal in size. Right Atrium: Right atrial size was normal in size. Pericardium: The pericardium was not well visualized. Mitral Valve: The mitral valve is abnormal. Mild to moderate mitral valve regurgitation. No evidence of mitral valve stenosis. Tricuspid Valve: The tricuspid valve is not well visualized. Tricuspid valve regurgitation is not demonstrated. Aortic Valve: The aortic valve was not well visualized. Aortic valve regurgitation is not visualized. No aortic stenosis is present. Aortic valve mean gradient measures 2.0 mmHg. Aortic valve peak gradient measures 3.4 mmHg. Aortic valve area, by VTI measures 3.19 cm. Pulmonic Valve: The pulmonic valve was not well visualized. Aorta: The aortic root is normal in size and structure. Pulmonary Artery: The pulmonary artery is not well seen. Venous: The inferior vena cava was not well visualized. IAS/Shunts: The interatrial septum was not well visualized.  LEFT VENTRICLE PLAX 2D LVIDd:         3.80 cm   Diastology LVIDs:         2.70 cm   LV e' medial:    5.76 cm/s LV PW:         1.30 cm   LV E/e' medial:  9.3 LV IVS:        1.20 cm   LV e' lateral:   7.40 cm/s LVOT diam:     2.00 cm   LV E/e' lateral: 7.2 LV SV:         58 LV SV Index:   30 LVOT Area:     3.14 cm  RIGHT VENTRICLE RV Basal diam:  3.50 cm RV S prime:     11.88 cm/s TAPSE (M-mode): 1.6 cm LEFT ATRIUM           Index        RIGHT ATRIUM           Index LA diam:      4.40 cm 2.24 cm/m   RA Area:     13.80 cm LA Vol (A2C): 23.4 ml 11.89 ml/m  RA Volume:   29.90 ml  15.19 ml/m LA Vol (A4C): 35.7 ml 18.13 ml/m  AORTIC  VALVE AV Area (Vmax):    3.13 cm AV Area (Vmean):   2.87 cm AV Area (VTI):     3.19 cm AV Vmax:           91.80 cm/s AV Vmean:          69.500 cm/s AV VTI:            0.183 m AV Peak Grad:      3.4 mmHg AV Mean Grad:      2.0 mmHg LVOT Vmax:         91.40 cm/s LVOT Vmean:        63.400 cm/s LVOT VTI:  0.186 m LVOT/AV VTI ratio: 1.02  AORTA Ao Root diam: 2.90 cm MITRAL VALVE               TRICUSPID VALVE MV Area (PHT): 6.02 cm    TR Peak grad:   16.2 mmHg MV Decel Time: 126 msec    TR Vmax:        201.00 cm/s MV E velocity: 53.60 cm/s MV A velocity: 94.70 cm/s  SHUNTS MV E/A ratio:  0.57        Systemic VTI:  0.19 m                            Systemic Diam: 2.00 cm Yvonne Kendall MD Electronically signed by Yvonne Kendall MD Signature Date/Time: 05/04/2021/6:49:13 PM    Final     Assessment/Plan The patient is an 85 year old male without significant past medical history presented to the [facility] emergency department via EMS due to progressively worsening shortness of breath found to have COVID-19 and pulmonary embolism on CTA of the chest  1.  Pulmonary Embolism: Patient endorses a history of progressively worsening shortness of breath.  Patient initially was seen at an urgent care and was diagnosed with bronchitis and treated with doxycycline.  Patient opted to seek medical attention via EMS to our emergency department when his shortness of breath progressively worsen.  Patient was found to be COVID-19 positive.  CTA of the chest was notable for acute pulmonary embolism with right heart strain.  Patient was started on heparin and admitted to the hospital.  Patient with continued shortness of breath, slightly tachycardic at times, blood pressure is stable, satting in the lower 90s on nasal cannula.  With the presence of right heart strain, tachycardia progressively worsening shortness of breath recommend the patient undergo pulmonary thrombectomy and thrombolysis in an  attempt to lessen the clot burden, improve his symptoms as well as right heart strain.  Procedure, risks and benefits were explained to the patient.  All questions were answered.  Patient would like to proceed.  We will plan on this tomorrow with Dr. Gilda Crease.  2.  Rule out DVT: Physical exam is essentially unremarkable Asymptomatic to the lower extremity at this time Will order bilateral venous duplex just to rule out DVT in the setting of PE.  3.  COVID-19: Contact/airborne precautions Currently being treated with antivirals, steroids etc.  Discussed with Dr. Romie Jumper, PA-C 05/06/2021 12:06 PM  This note was created with Dragon medical transcription system.  Any error is purely unintentional.

## 2021-05-06 NOTE — Consult Note (Signed)
PHARMACY CONSULT NOTE  Pharmacy Consult for Electrolyte Monitoring and Replacement   Recent Labs: Potassium (mmol/L)  Date Value  05/06/2021 4.4   Magnesium (mg/dL)  Date Value  09/81/1914 2.5 (H)   Calcium (mg/dL)  Date Value  78/29/5621 7.8 (L)   Albumin (g/dL)  Date Value  30/86/5784 1.9 (L)  07/29/2019 4.2   Phosphorus (mg/dL)  Date Value  69/62/9528 2.9   Sodium (mmol/L)  Date Value  05/06/2021 136  07/29/2019 141    Assessment: 85yo Male w/ h/o GERD & HTN had recent outpatient treatment with doxycycline but had worsening SOB later found to be covid+. Presenting 10/10 with multifocal PNA (on VAN/CFP > CFP), Covid+, & elevated D-dimer started on empiric heparin therapy d/t c/f thrombi. Pharmacy consulted for mgmt of electrolytes  Goal of Therapy:  Electrolytes within normal limits  Plan:  --No electrolyte replacement indicated at this time --Patient care has been transferred from [facility] to [facility]; will discontinue electrolyte consult at this time --Pharmacy will continue to monitor peripherally  [name] 05/06/2021 8:27 AM

## 2021-05-06 NOTE — Consult Note (Signed)
ANTICOAGULATION CONSULT NOTE  Pharmacy Consult for heparin drip Indication: pulmonary embolus  Patient Measurements: Heparin Dosing Weight: 71.2kg  Labs: Recent Labs    29-May-2021 1719 05/29/2021 1746 05/29/21 1923 2021-05-29 1940 05/04/21 0605 05/04/21 1547 05/05/21 0054 05/05/21 1055 05/05/21 1957 05/06/21 0651  HGB 14.1  --   --   --  12.2*  --  11.0*  --   --  11.6*  HCT 41.3  --   --   --  34.0*  --  31.5*  --   --  34.1*  PLT 315  --   --   --  254  --  278  --   --  320  APTT  --   --   --  31  --   --   --   --   --   --   LABPROT  --   --   --  17.7*  --   --   --   --   --   --   INR  --   --   --  1.5*  --   --   --   --   --   --   HEPARINUNFRC  --   --   --   --  0.14*   < > 0.23* 0.68 0.70 0.69  CREATININE 1.59*   < >  --   --  1.27*  --  1.06  --   --  1.19  TROPONINIHS 224*  --  223*  --   --   --   --   --   --   --    < > = values in this interval not displayed.    Estimated Creatinine Clearance: 49.6 mL/min (by C-G formula based on SCr of 1.19 mg/dL).  Medications:  No PTA anticoagulation of note Heparin Dosing Weight: 71.2kg  Assessment: 85 yo male admitted with acute hypoxic respiratory failure, COVID+ PNA, and elevated D-Dimer. CTA 10/12 impression of extensive PE with associated right heart strain.   Date Time HL Rate/Comment 10/11 0605 0.14 Subthera; 1150 un/hr 10/11 1547 0.40 Thera x 1 , 1400 un/hr  10/12  0054    0.23     Subthera , 1400 un/hr 10/12 1055 0.68 Thera  x1; 1550 un/hr  10/12 1957 0.70 Thera  x 2; 1550 un/hr 10/13  0651 0.69 Thera x 3, 1550 un/hr   Baseline Labs: aPTT - 31s INR - 1.5 Hgb - 14.1 Plts - 315  Goal of Therapy:  Heparin level 0.3-0.7 units/ml Monitor platelets by anticoagulation protocol: Yes   Plan: Heparin remains therapeutic Continue heparin infusion at 1550 units/hr Will recheck HL with AM labs CBC per protocol   [name] 05/06/2021 8:22 AM

## 2021-05-07 ENCOUNTER — Encounter: Admission: EM | Disposition: E | Payer: Self-pay | Source: Home / Self Care | Attending: Obstetrics and Gynecology

## 2021-05-07 ENCOUNTER — Inpatient Hospital Stay: Payer: Medicare Other

## 2021-05-07 DIAGNOSIS — I2699 Other pulmonary embolism without acute cor pulmonale: Secondary | ICD-10-CM

## 2021-05-07 DIAGNOSIS — I519 Heart disease, unspecified: Secondary | ICD-10-CM

## 2021-05-07 DIAGNOSIS — I82409 Acute embolism and thrombosis of unspecified deep veins of unspecified lower extremity: Secondary | ICD-10-CM | POA: Diagnosis present

## 2021-05-07 DIAGNOSIS — R0902 Hypoxemia: Secondary | ICD-10-CM

## 2021-05-07 HISTORY — PX: PULMONARY THROMBECTOMY: CATH118295

## 2021-05-07 LAB — CBC WITH DIFFERENTIAL/PLATELET
Abs Immature Granulocytes: 0.6 10*3/uL — ABNORMAL HIGH (ref 0.00–0.07)
Basophils Absolute: 0 10*3/uL (ref 0.0–0.1)
Basophils Relative: 0 %
Eosinophils Absolute: 0 10*3/uL (ref 0.0–0.5)
Eosinophils Relative: 0 %
HCT: 36.1 % — ABNORMAL LOW (ref 39.0–52.0)
Hemoglobin: 12.1 g/dL — ABNORMAL LOW (ref 13.0–17.0)
Immature Granulocytes: 3 %
Lymphocytes Relative: 0 %
Lymphs Abs: 0 10*3/uL — ABNORMAL LOW (ref 0.7–4.0)
MCH: 31.6 pg (ref 26.0–34.0)
MCHC: 33.5 g/dL (ref 30.0–36.0)
MCV: 94.3 fL (ref 80.0–100.0)
Monocytes Absolute: 1.2 10*3/uL — ABNORMAL HIGH (ref 0.1–1.0)
Monocytes Relative: 5 %
Neutro Abs: 20.1 10*3/uL — ABNORMAL HIGH (ref 1.7–7.7)
Neutrophils Relative %: 92 %
Platelets: 353 10*3/uL (ref 150–400)
RBC: 3.83 MIL/uL — ABNORMAL LOW (ref 4.22–5.81)
RDW: 13.2 % (ref 11.5–15.5)
WBC: 21.9 10*3/uL — ABNORMAL HIGH (ref 4.0–10.5)
nRBC: 0 % (ref 0.0–0.2)

## 2021-05-07 LAB — COMPREHENSIVE METABOLIC PANEL
ALT: 61 U/L — ABNORMAL HIGH (ref 0–44)
AST: 87 U/L — ABNORMAL HIGH (ref 15–41)
Albumin: 2 g/dL — ABNORMAL LOW (ref 3.5–5.0)
Alkaline Phosphatase: 64 U/L (ref 38–126)
Anion gap: 7 (ref 5–15)
BUN: 38 mg/dL — ABNORMAL HIGH (ref 8–23)
CO2: 25 mmol/L (ref 22–32)
Calcium: 7.9 mg/dL — ABNORMAL LOW (ref 8.9–10.3)
Chloride: 103 mmol/L (ref 98–111)
Creatinine, Ser: 1.3 mg/dL — ABNORMAL HIGH (ref 0.61–1.24)
GFR, Estimated: 55 mL/min — ABNORMAL LOW (ref >60)
Glucose, Bld: 160 mg/dL — ABNORMAL HIGH (ref 70–99)
Potassium: 4.5 mmol/L (ref 3.5–5.1)
Sodium: 135 mmol/L (ref 135–145)
Total Bilirubin: 0.6 mg/dL (ref 0.3–1.2)
Total Protein: 6.2 g/dL — ABNORMAL LOW (ref 6.5–8.1)

## 2021-05-07 LAB — MAGNESIUM: Magnesium: 2.5 mg/dL — ABNORMAL HIGH (ref 1.7–2.4)

## 2021-05-07 LAB — HEPARIN LEVEL (UNFRACTIONATED): Heparin Unfractionated: 0.65 IU/mL (ref 0.30–0.70)

## 2021-05-07 LAB — D-DIMER, QUANTITATIVE: D-Dimer, Quant: 10.66 ug/mL-FEU — ABNORMAL HIGH (ref 0.00–0.50)

## 2021-05-07 LAB — C-REACTIVE PROTEIN: CRP: 5.5 mg/dL — ABNORMAL HIGH (ref <1.0)

## 2021-05-07 SURGERY — PULMONARY THROMBECTOMY
Anesthesia: Moderate Sedation | Laterality: Bilateral

## 2021-05-07 MED ORDER — METHYLPREDNISOLONE SODIUM SUCC 125 MG IJ SOLR: 125.0000 mg | Freq: Once | INTRAMUSCULAR | Status: DC | PRN

## 2021-05-07 MED ORDER — HEPARIN SODIUM (PORCINE) 1000 UNIT/ML IJ SOLN
INTRAMUSCULAR | Status: DC | PRN
  Administered 2021-05-07: 3000 [IU] via INTRAVENOUS

## 2021-05-07 MED ORDER — CLINDAMYCIN PHOSPHATE 300 MG/50ML IV SOLN
INTRAVENOUS | Status: AC
  Administered 2021-05-07: 300 mg via INTRAVENOUS
  Filled 2021-05-07: qty 50

## 2021-05-07 MED ORDER — MIDAZOLAM HCL 2 MG/ML PO SYRP: 8.0000 mg | ORAL_SOLUTION | Freq: Once | ORAL | Status: DC | PRN

## 2021-05-07 MED ORDER — HEPARIN (PORCINE) 25000 UT/250ML-% IV SOLN
1450.0000 [IU]/h | INTRAVENOUS | Status: DC
  Administered 2021-05-07 – 2021-05-08 (×2): 1550 [IU]/h via INTRAVENOUS
  Filled 2021-05-07: qty 250

## 2021-05-07 MED ORDER — CLINDAMYCIN PHOSPHATE 300 MG/50ML IV SOLN: 300.0000 mg | Freq: Once | INTRAVENOUS | Status: AC

## 2021-05-07 MED ORDER — HEPARIN SODIUM (PORCINE) 1000 UNIT/ML IJ SOLN
INTRAMUSCULAR | Status: AC
  Filled 2021-05-07: qty 1

## 2021-05-07 MED ORDER — DIPHENHYDRAMINE HCL 50 MG/ML IJ SOLN: 50.0000 mg | Freq: Once | INTRAMUSCULAR | Status: DC | PRN

## 2021-05-07 MED ORDER — METHYLPREDNISOLONE SODIUM SUCC 40 MG IJ SOLR
40.0000 mg | Freq: Two times a day (BID) | INTRAMUSCULAR | Status: DC
  Administered 2021-05-07 – 2021-05-08 (×4): 40 mg via INTRAVENOUS
  Filled 2021-05-07 (×3): qty 1

## 2021-05-07 MED ORDER — SODIUM CHLORIDE 0.9 % IV SOLN: INTRAVENOUS | Status: DC

## 2021-05-07 MED ORDER — HYDROMORPHONE HCL 1 MG/ML IJ SOLN: 1.0000 mg | Freq: Once | INTRAMUSCULAR | Status: DC | PRN

## 2021-05-07 MED ORDER — FAMOTIDINE 20 MG PO TABS: 40.0000 mg | ORAL_TABLET | Freq: Once | ORAL | Status: DC | PRN

## 2021-05-07 MED ORDER — MIDAZOLAM HCL 5 MG/5ML IJ SOLN
INTRAMUSCULAR | Status: AC
  Filled 2021-05-07: qty 5

## 2021-05-07 MED ORDER — FENTANYL CITRATE PF 50 MCG/ML IJ SOSY
PREFILLED_SYRINGE | INTRAMUSCULAR | Status: AC
  Filled 2021-05-07: qty 2

## 2021-05-07 MED ORDER — LABETALOL HCL 5 MG/ML IV SOLN
10.0000 mg | INTRAVENOUS | Status: DC | PRN
  Administered 2021-05-07 – 2021-05-08 (×2): 10 mg via INTRAVENOUS
  Filled 2021-05-07 (×3): qty 4

## 2021-05-07 MED ORDER — ONDANSETRON HCL 4 MG/2ML IJ SOLN: 4.0000 mg | Freq: Four times a day (QID) | INTRAMUSCULAR | Status: DC | PRN

## 2021-05-07 SURGICAL SUPPLY — 21 items
CANISTER PENUMBRA ENGINE (MISCELLANEOUS) ×1 IMPLANT
CATH ANGIO 5F PIGTAIL 100CM (CATHETERS) ×1 IMPLANT
CATH INFINITI JR4 5F (CATHETERS) ×1 IMPLANT
CATH LIGHTNING 8 XTORQ 115 (CATHETERS) ×1 IMPLANT
CATH SELECT H1 TIP 5F 130 (CATHETERS) ×1 IMPLANT
COVER PROBE U/S 5X48 (MISCELLANEOUS) ×1 IMPLANT
DEVICE SAFEGUARD 24CM (GAUZE/BANDAGES/DRESSINGS) ×1 IMPLANT
DEVICE TORQUE .025-.038 (MISCELLANEOUS) ×1 IMPLANT
GLIDEWIRE ANGLED SS 035X260CM (WIRE) ×1 IMPLANT
NDL ENTRY 21GA 7CM ECHOTIP (NEEDLE) IMPLANT
NEEDLE ENTRY 21GA 7CM ECHOTIP (NEEDLE) ×2 IMPLANT
PACK ANGIOGRAPHY (CUSTOM PROCEDURE TRAY) ×2 IMPLANT
SET INTRO CAPELLA COAXIAL (SET/KITS/TRAYS/PACK) ×1 IMPLANT
SHEATH BRITE TIP 8FRX11 (SHEATH) ×1 IMPLANT
SHEATH DESTINIATION 65 8FR (SHEATH) ×1 IMPLANT
SHEATH DRYSEAL FLEX 10FR 33CM (SHEATH) IMPLANT
SHEATH DRYSEAL FLEX 33 10FR (SHEATH) ×1
SYR MEDRAD MARK 7 150ML (SYRINGE) ×1 IMPLANT
TUBING CONTRAST HIGH PRESS 72 (TUBING) ×1 IMPLANT
WIRE AMPLATZ SSTIFF .035X260CM (WIRE) ×1 IMPLANT
WIRE GUIDERIGHT .035X150 (WIRE) ×2 IMPLANT

## 2021-05-07 NOTE — Plan of Care (Signed)

## 2021-05-07 NOTE — Progress Notes (Signed)
PROGRESS NOTE    Jonathan Salinas  XIP:[id number] DOB: 26-Jan-1936 DOA: 05-08-21 PCP: [name], MD  Outpatient Specialists: none    Brief Narrative:   From admission h and p: This is an 85 yo male who presented to [facility] ER on 10/10 via EMS with c/o shortness of breath onset 09/29.Marland Kitchen  He was evaluated at [facility] on 10/3, and initially treated with doxycycline 100 mg bid for suspected bronchitis pending COVID-19 test results.  Pt is unvaccinated.  His COVID-19 results came back positive, however he was out of the window for antiviral treatment, and provider recommended supportive care.  On 10/10 pt reported each time he took the doxycycline as prescribed he vomited and he continued to have worsening shortness of breath, therefore EMS notified. EMS reported upon their arrival at pts home his O2 sats were 68% on RA.  EMS placed pt on NRB @15L , pt received  solumedrol and duonebs with O2 sats increasing to the 80's.   ED Course: Upon arrival to the ER pt remained hypoxic with O2 sats in the low to mid 80's requiring transition to 100% HHFNC @60  L/min.  CXR concerning for multifocal pneumonia.  Lab results revealed wbc 21.0 and fibrinogen 535.  Pt received cefepime and vancomycin.  PCCM team contacted for ICU admission.        Assessment & Plan:   Active Problems:   COVID-19   Pulmonary embolism (HCC)  # Acute hypoxic respiratory failure # Covid pneumonia O2 80s on arrival, initially requiring 100% hfnc @ 60. Now weaned down to 15 L HFNC. Symptoms began 9/29. Treated w/ doxycycline as outpatient. Not vaccinated. PE contributing. - continue methylpred (started 10/10), decrease today from 60 bid to 40 bid, will plan to continue to taper.  - started baricitinib (start 10/12), plan for up to 14 days - stopped cefepime, no evidence pneumonia  # Pulmonary embolism # DVT Significant right-sided PE. Evidence right heart strain; TTE shows low-normal RV function. B/l distal  tibial dvt on lower extremity PVLs - vascular consulted, planning on thrombectomy with possible thrombolysis today - continue heparin IV for now, switch to doac after procedure  # HTN Here bp elevated - resumed home lisinopril  # Transaminitis Mild. Possibly 2/2 baricitinib - monitor for now   DVT prophylaxis: heparin Code Status: full Family Communication: son 10/29 updated @ bedside 10/14  Level of care: Stepdown Status is: Inpatient  Remains inpatient appropriate because:Inpatient level of care appropriate due to severity of illness  Dispo: The patient is from: Home              Anticipated d/c is to: Home              Patient currently is not medically stable to d/c.   Difficult to place patient No     Consultants:  none  Procedures: none  Antimicrobials:  S/p cefepime    Subjective: This morning breathing stable. No chest pain.  Objective: Vitals:   05/13/2021 0400 05/08/2021 0440 05/08/2021 0500 05/06/2021 0600  BP: (!) 146/90  (!) 148/82 129/69  Pulse: 74  89 70  Resp: (!) 21  (!) 23 18  Temp: 98.2 F (36.8 C)     TempSrc: Oral     SpO2: 100%  98% 97%  Weight:  75.4 kg    Height:        Intake/Output Summary (Last 24 hours) at 04/27/2021 1037 Last data filed at 05/09/2021 0600 Gross per 24 hour  Intake 1355.95 ml  Output 400 ml  Net 955.95 ml   Filed Weights   05/05/21 0401 05/06/21 0500 2021/06/04 0440  Weight: 73 kg 74.6 kg 75.4 kg    Examination:  General exam: Appears calm and comfortable  Respiratory system: scattered rhonchi and rales Cardiovascular system: S1 & S2 heard, RRR. No JVD, murmurs, rubs, gallops or clicks. No pedal edema. Gastrointestinal system: Abdomen is nondistended, soft and nontender. No organomegaly or masses felt. Normal bowel sounds heard. Central nervous system: Alert and oriented. No focal neurological deficits. Extremities: Symmetric 5 x 5 power. Skin: No rashes, lesions or ulcers Psychiatry: Judgement and  insight appear normal. Mood & affect appropriate.     Data Reviewed: I have personally reviewed following labs and imaging studies  CBC: Recent Labs  Lab 05/15/2021 1719 05/04/21 0605 05/05/21 0054 05/06/21 0651 2021-06-04 0611  WBC 21.0* 14.8* 19.0* 15.6* 21.9*  NEUTROABS 18.1* 13.4* 17.1* 14.1* 20.1*  HGB 14.1 12.2* 11.0* 11.6* 12.1*  HCT 41.3 34.0* 31.5* 34.1* 36.1*  MCV 91.8 90.2 92.1 91.9 94.3  PLT 315 254 278 320 353   Basic Metabolic Panel: Recent Labs  Lab 05/08/2021 1719 05/02/2021 1746 05/04/21 0605 05/05/21 0054 05/06/21 0651 06-04-2021 0611  NA 137  --  136 134* 136 135  K 3.4*  --  3.8 3.4* 4.4 4.5  CL 101  --  103 98 100 103  CO2 15*  --  23 26 27 25   GLUCOSE 184*  --  129* 133* 154* 160*  BUN 34*  --  35* 32* 27* 38*  CREATININE 1.59* 1.52* 1.27* 1.06 1.19 1.30*  CALCIUM 8.5*  --  8.0* 7.0* 7.8* 7.9*  MG  --  2.2 2.2 2.2 2.5* 2.5*  PHOS  --  4.1 4.0 2.9  --   --    GFR: Estimated Creatinine Clearance: 45.9 mL/min (A) (by C-G formula based on SCr of 1.3 mg/dL (H)). Liver Function Tests: Recent Labs  Lab 05/02/2021 1719 05/04/21 0605 05/05/21 0054 05/06/21 0651 06-04-21 0611  AST 55* 27 42* 28 87*  ALT 32 23 26 24  61*  ALKPHOS 73 57 49 50 64  BILITOT 1.1 1.0 0.7 0.7 0.6  PROT 7.5 6.2* 5.4* 5.9* 6.2*  ALBUMIN 2.4* 1.9* 1.7* 1.9* 2.0*   No results for input(s): LIPASE, AMYLASE in the last 168 hours. No results for input(s): AMMONIA in the last 168 hours. Coagulation Profile: Recent Labs  Lab 04/26/2021 1940  INR 1.5*   Cardiac Enzymes: No results for input(s): CKTOTAL, CKMB, CKMBINDEX, TROPONINI in the last 168 hours. BNP (last 3 results) No results for input(s): PROBNP in the last 8760 hours. HbA1C: No results for input(s): HGBA1C in the last 72 hours.  CBG: Recent Labs  Lab 05/04/21 1551 05/04/21 1912 05/04/21 2311 05/05/21 0301 05/05/21 0751  GLUCAP 147* 133* 118* 124* 122*   Lipid Profile: No results for input(s): CHOL, HDL,  LDLCALC, TRIG, CHOLHDL, LDLDIRECT in the last 72 hours.  Thyroid Function Tests: No results for input(s): TSH, T4TOTAL, FREET4, T3FREE, THYROIDAB in the last 72 hours.  Anemia Panel: Recent Labs    05/05/21 0054  FERRITIN 616*   Urine analysis:    Component Value Date/Time   BILIRUBINUR neg 11/20/2015 1511   PROTEINUR neg 11/20/2015 1511   UROBILINOGEN 0.2 11/20/2015 1511   NITRITE neg 11/20/2015 1511   LEUKOCYTESUR Negative 11/20/2015 1511   Sepsis Labs: @LABRCNTIP (procalcitonin:4,lacticidven:4)  ) Recent Results (from the past 240 hour(s))  Blood Culture (routine x 2)  Status: None (Preliminary result)   Collection Time: 05/21/2021  5:33 PM   Specimen: BLOOD  Result Value Ref Range Status   Specimen Description BLOOD BLOOD LEFT FOREARM  Final   Special Requests   Final    BOTTLES DRAWN AEROBIC AND ANAEROBIC Blood Culture adequate volume   Culture   Final    NO GROWTH 4 DAYS Performed at [facility], [address] [place], [place] [address]    Report Status PENDING  Incomplete  Blood Culture (routine x 2)     Status: None (Preliminary result)   Collection Time: May 21, 2021  5:40 PM   Specimen: BLOOD  Result Value Ref Range Status   Specimen Description BLOOD BLOOD RIGHT FOREARM  Final   Special Requests   Final    BOTTLES DRAWN AEROBIC AND ANAEROBIC Blood Culture adequate volume   Culture   Final    NO GROWTH 4 DAYS Performed at [facility], [address], [place], [place] [address]    Report Status PENDING  Incomplete  MRSA Next Gen by PCR, Nasal     Status: None   Collection Time: 2021/05/21  7:07 PM   Specimen: Nasal Mucosa; Nasal Swab  Result Value Ref Range Status   MRSA by PCR Next Gen NOT DETECTED NOT DETECTED Final    Comment: (NOTE) The GeneXpert MRSA Assay (FDA approved for NASAL specimens only), is one component of a comprehensive MRSA colonization surveillance program. It is not intended to diagnose MRSA infection nor to  guide or monitor treatment for MRSA infections. Test performance is not FDA approved in patients less than 41 years old. Performed at [facility], [address] [place], [place] [address]          Radiology Studies: CT Angio Chest Pulmonary Embolism (PE) W or WO Contrast  Result Date: 05/05/2021 CLINICAL DATA:  Evaluate for pulmonary embolus EXAM: CT ANGIOGRAPHY CHEST WITH CONTRAST TECHNIQUE: Multidetector CT imaging of the chest was performed using the standard protocol during bolus administration of intravenous contrast. Multiplanar CT image reconstructions and MIPs were obtained to evaluate the vascular anatomy. CONTRAST:  80mL OMNIPAQUE IOHEXOL 350 MG/ML SOLN COMPARISON:  None. FINDINGS: Cardiovascular: Adequate contrast opacification of the pulmonary arteries. Pulmonary embolus seen in the distal right main pulmonary artery, segmental right upper and lower lobe pulmonary arteries, and bilateral subsegmental pulmonary arteries. RV/LV ratio is greater than 1. Normal heart size. No pericardial effusion. No significant coronary artery calcifications. Atherosclerotic disease of the thoracic aorta. Mediastinum/Nodes: Small hiatal hernia. Thyroid is unremarkable. Prominent subcentimeter mediastinal lymph nodes which are likely reactive. Reference subcarinal lymph node measuring 8 mm in short axis on series 5, image 134. Lungs/Pleura: Central airways are patent. Bilateral patchy peripheral predominant ground-glass opacities. No pleural effusion or pneumothorax, Upper Abdomen: Low-density liver lesions, largest measuring up to 3.3 cm, which are likely simple cysts. No acute abnormality. Musculoskeletal: No chest wall abnormality. No acute or significant osseous findings. Review of the MIP images confirms the above findings. IMPRESSION: Pulmonary embolus seen in the distal right main pulmonary artery, segmental right upper and lower lobe pulmonary arteries, and bilateral subsegmental  pulmonary arteries. RV/LV ratio is greater than 1, which is suggestive of heart right heart strain. Correlate with echocardiography. Patchy, bilateral peripheral predominant ground-glass opacities, compatible with history of COVID-19 infection. Aortic Atherosclerosis (ICD10-I70.0). Critical Value/emergent results were called by telephone at the time of interpretation on 05/05/2021 at 2:38 pm to RN Jayme Cloud, who verbally acknowledged these results and will relay  the results to the hospitalist on call. Electronically Signed   By: Allegra Lai M.D.   On: 05/05/2021 14:39   US Venous Img Lower Bilateral (DVT)  Result Date: 05/19/2021 CLINICAL DATA:  Bilateral lower extremity pain. Pulmonary embolism. History of COVID. Evaluate for lower extremity DVT. EXAM: BILATERAL LOWER EXTREMITY VENOUS DOPPLER ULTRASOUND TECHNIQUE: Gray-scale sonography with graded compression, as well as color Doppler and duplex ultrasound were performed to evaluate the lower extremity deep venous systems from the level of the common femoral vein and including the common femoral, femoral, profunda femoral, popliteal and calf veins including the posterior tibial, peroneal and gastrocnemius veins when visible. The superficial great saphenous vein was also interrogated. Spectral Doppler was utilized to evaluate flow at rest and with distal augmentation maneuvers in the common femoral, femoral and popliteal veins. COMPARISON:  None. FINDINGS: RIGHT LOWER EXTREMITY Common Femoral Vein: No evidence of thrombus. Normal compressibility, respiratory phasicity and response to augmentation. Saphenofemoral Junction: No evidence of thrombus. Normal compressibility and flow on color Doppler imaging. Profunda Femoral Vein: No evidence of thrombus. Normal compressibility and flow on color Doppler imaging. Femoral Vein: No evidence of thrombus. Normal compressibility, respiratory phasicity and response to augmentation. Popliteal Vein: No evidence of  thrombus. Normal compressibility, respiratory phasicity and response to augmentation. Calf Veins: No evidence of thrombus. Normal compressibility and flow on color Doppler imaging. Superficial Great Saphenous Vein: No evidence of thrombus. Normal compressibility. Other Findings: Note is made of an approximately 3.0 x 2.8 x 0.6 cm minimally complex fluid collection with the right popliteal fossa compatible with a Baker's cyst. LEFT LOWER EXTREMITY Common Femoral Vein: No evidence of thrombus. Normal compressibility, respiratory phasicity and response to augmentation. Saphenofemoral Junction: No evidence of thrombus. Normal compressibility and flow on color Doppler imaging. Profunda Femoral Vein: No evidence of thrombus. Normal compressibility and flow on color Doppler imaging. Femoral Vein: No evidence of thrombus. Normal compressibility, respiratory phasicity and response to augmentation. Popliteal Vein: No evidence of thrombus. Normal compressibility, respiratory phasicity and response to augmentation. Calf Veins: The examination is positive for occlusive DVT involving both paired posterior tibial veins (images 60 and 61). The peroneal veins appear patent where imaged. Superficial Great Saphenous Vein: No evidence of thrombus. Normal compressibility. Other Findings:  None. IMPRESSION: 1. Examination is positive for occlusive DVT involving both paired left posterior tibial veins. There is no extension of this distal tibial DVT to the more proximal venous system of the left lower extremity. 2. No evidence of DVT within the right lower extremity. Electronically Signed   By: Simonne Come M.D.   On: 05/16/2021 07:39        Scheduled Meds:  albuterol  2 puff Inhalation Q6H   vitamin C  500 mg Oral Daily   baricitinib  4 mg Oral Daily   Chlorhexidine Gluconate Cloth  6 each Topical Q0600   famotidine  20 mg Oral BID   feeding supplement  237 mL Oral TID BM   lisinopril  10 mg Oral Daily   mouth rinse  15 mL  Mouth Rinse BID   methylPREDNISolone (SOLU-MEDROL) injection  60 mg Intravenous Q12H   multivitamin with minerals  1 tablet Oral Daily   sodium chloride flush  3 mL Intravenous Q12H   Continuous Infusions:  sodium chloride     sodium chloride 75 mL/hr at 05/11/2021 0013   heparin 1,550 Units/hr (05/06/21 1728)     LOS: 4 days    Time spent: 25 min    Anette Riedel  Cleotis Lema, MD [facility]   If 7PM-7AM, please contact night-coverage [email or web address] Password [email or web address] 05/04/2021, 10:37 AM

## 2021-05-07 NOTE — Interval H&P Note (Signed)
History and Physical Interval Note:  05/01/2021 2:34 PM  Jonathan Salinas  has presented today for surgery, with the diagnosis of PE.  The various methods of treatment have been discussed with the patient and family. After consideration of risks, benefits and other options for treatment, the patient has consented to  Procedure(s) with comments: PULMONARY THROMBECTOMY (Bilateral) - Covid (+) as a surgical intervention.  The patient's history has been reviewed, patient examined, no change in status, stable for surgery.  I have reviewed the patient's chart and labs.  Questions were answered to the patient's satisfaction.     Levora Dredge

## 2021-05-07 NOTE — Op Note (Signed)
[facility]  Percutaneous Study/Intervention Procedural Note   Date of Surgery: 2021/05/15,4:11 PM  Surgeon:Taegen Lennox, Latina Craver   Pre-operative Diagnosis: Symptomatic pulmonary emboli with right heart strain and hypoxia  Post-operative diagnosis:  Same  Procedure(s) Performed:  1.  Selective injection right subsegmental pulmonary arteries; right middle lobe and lower lobe pulmonary arteries.  2.  Mechanical thrombectomy bilateral lobar pulmonary arteries for removal of pulmonary emboli using the Penumbra CAT 8 thrombectomy catheter.  3.  Ultrasound-guided access to the right common femoral vein    Anesthesia: No sedation was administered  Sheath: 10 French dry seal right common femoral vein antegrade  Contrast: 75 cc   Fluoroscopy Time: 17.3 minutes  Indications:  Patient presented to the hospital with hypoxia and shortness of breath. The patient is unable to get out of bed and transfer to a chair without increased dyspnea. The patient's O2 saturations off nasal cannula are in the 80%. CT angiogram demonstrated bilateral pulmonary emboli associated with significant right heart strain. Given the long-term sequela and the patient's symptomatic condition the risks and benefits for angiography with thrombectomy are reviewed all questions are answered, the patient agrees to proceed.  Procedure:  Jonathan Salinas a 85 y.o. male who was identified and appropriate procedural time out was performed.  The patient was then placed supine on the table and prepped and draped in the usual sterile fashion.  Ultrasound was used to evaluate the right common femoral vein.  It was compressible indicating it is patent .  A digital ultrasound image was acquired for the permanent record.  A micropuncture needle was used to access the right common femoral vein under direct ultrasound guidance.  A microwire was then advanced under fluoroscopic guidance followed by micro-sheath.  A 0.035  J wire was advanced without resistance and a 5Fr sheath was placed.    3000 units of heparin was then given and allowed to circulate.  The J-wire and pigtail catheter was advanced up to the right atrium where a bolus injection contrast was used to demonstrate the pulmonary artery outflow.  Stiff angled Glidewire was then exchanged for the J-wire and the pigtail catheter was exchanged for JR4 catheter. Using the combination of the stiff angled Glidewire and the JR4 catheter the pulmonary outflow track was selected.  I then used the penumbra CAT 8 device as well as a Glidewire and an angled catheter to select the right main pulmonary artery  A select catheter was then advanced over the Amplatz wire into the distal right main pulmonary artery and hand-injection confirmed the thrombus. The Penumbra Cat 8 extra torque catheter was then advanced into the thrombus in the right lower lobe pulmonary artery.  Hand-injection contrast was used to verify positioning and evaluate the distal anatomy.  Mechanical aspiration was performed using the CAT 8 catheter and a separator.  After multiple passes the catheter was then repositioned into the right middle lobe pulmonary artery and again hand-injection contrast was performed to verify position and evaluate the distal anatomy.  Multiple passes were made using mechanical aspiration in association with a separator.  Once there was free flow of blood from both the middle and lower lobar arteries the catheter was repositioned to the right main pulmonary artery.  Hand-injection contrast was then used to give an assessment of the effectiveness of thrombectomy.  A bolus injection of contrast was then used to create a final image of the pulmonary vasculature.  After review these images the catheter and sheath were  removed and pressure held. There were no immediate complications.  Findings:   Right pulmonary artery: Initial imaging of the right main pulmonary artery  demonstrated flow within the right upper lobe.  The right middle and lower lobe were obstructed at the lobar level.  The segmental branches of both the middle and lower lobes were also occluded with thrombus.  This was confirmed by selective injection of both the middle and lower lobes through the catheter.  Following thrombectomy with the CAT 8 device selecting first in the lower lobe and then selecting the middle lobe there is less than 10% residual thrombus noted on the final image with filling of all 3 lobes all the way out to the periphery.  Marked improvement compared to the initial image.     Disposition: Patient was taken to the recovery room in stable condition having tolerated the procedure well.  Jonathan Salinas 04/30/2021,4:11 PM

## 2021-05-07 NOTE — Consult Note (Signed)
ANTICOAGULATION CONSULT NOTE  Pharmacy Consult for heparin drip Indication: pulmonary embolus  Patient Measurements: Heparin Dosing Weight: 71.2kg  Labs: Recent Labs    05/05/21 0054 05/05/21 1055 05/05/21 1957 05/06/21 0651 04/27/2021 0611  HGB 11.0*  --   --  11.6* 12.1*  HCT 31.5*  --   --  34.1* 36.1*  PLT 278  --   --  320 353  HEPARINUNFRC 0.23*   < > 0.70 0.69 0.65  CREATININE 1.06  --   --  1.19 1.30*   < > = values in this interval not displayed.    Estimated Creatinine Clearance: 45.9 mL/min (A) (by C-G formula based on SCr of 1.3 mg/dL (H)).  Medications:  No PTA anticoagulation of note Heparin Dosing Weight: 71.2kg  Assessment: 85 yo male admitted with acute hypoxic respiratory failure, COVID+ PNA, and elevated D-Dimer. CTA 10/12 impression of extensive PE with associated right heart strain.   Baseline Labs: aPTT - 31s INR - 1.5 Hgb - 14.1 Plts - 315  Date Time HL Rate/Comment 10/11 0605 0.14 Subthera; 1150 un/hr 10/11 1547 0.40 Thera x 1 , 1400 un/hr  10/12  0054    0.23     Subthera , 1400 un/hr 10/12 1055 0.68 Thera  x1; 1550 un/hr  10/12 1957 0.70 Thera  x 2; 1550 un/hr 10/13  0651 0.69 Thera x 3, 1550 un/hr  10/14   0611   0.65     Thera X 4, 1550 units/hr   Goal of Therapy:  Heparin level 0.3-0.7 units/ml Monitor platelets by anticoagulation protocol: Yes   Plan: Per Dr. Gilda Crease, restart heparin at previous rate (1550 units/hr) at 1800 with no initial bolus and follow nomogram thereafter. Recheck HL in 8 hours after restart CBC daily while on heparin   Kathrynne Kulinski O Evin Loiseau 05/10/2021 4:21 PM

## 2021-05-07 NOTE — Consult Note (Signed)
ANTICOAGULATION CONSULT NOTE  Pharmacy Consult for heparin drip Indication: pulmonary embolus  Patient Measurements: Heparin Dosing Weight: 71.2kg  Labs: Recent Labs    05/05/21 0054 05/05/21 1055 05/05/21 1957 05/06/21 0651 05/23/2021 0611  HGB 11.0*  --   --  11.6* 12.1*  HCT 31.5*  --   --  34.1* 36.1*  PLT 278  --   --  320 353  HEPARINUNFRC 0.23*   < > 0.70 0.69 0.65  CREATININE 1.06  --   --  1.19 1.30*   < > = values in this interval not displayed.    Estimated Creatinine Clearance: 45.9 mL/min (A) (by C-G formula based on SCr of 1.3 mg/dL (H)).  Medications:  No PTA anticoagulation of note Heparin Dosing Weight: 71.2kg  Assessment: 85 yo male admitted with acute hypoxic respiratory failure, COVID+ PNA, and elevated D-Dimer. CTA 10/12 impression of extensive PE with associated right heart strain.   Date Time HL Rate/Comment 10/11 0605 0.14 Subthera; 1150 un/hr 10/11 1547 0.40 Thera x 1 , 1400 un/hr  10/12  0054    0.23     Subthera , 1400 un/hr 10/12 1055 0.68 Thera  x1; 1550 un/hr  10/12 1957 0.70 Thera  x 2; 1550 un/hr 10/13  0651 0.69 Thera x 3, 1550 un/hr  10/14   0611   0.65     Thera X 4, 1550 units/hr Baseline Labs: aPTT - 31s INR - 1.5 Hgb - 14.1 Plts - 315  Goal of Therapy:  Heparin level 0.3-0.7 units/ml Monitor platelets by anticoagulation protocol: Yes   Plan: 10/14:  HL @ 0611 = 0.65 Will continue pt on current rate and recheck HL on 10/15 with AM labs.   Crisol Muecke D 05/12/2021 7:11 AM

## 2021-05-08 DIAGNOSIS — I2699 Other pulmonary embolism without acute cor pulmonale: Secondary | ICD-10-CM | POA: Diagnosis not present

## 2021-05-08 LAB — CULTURE, BLOOD (ROUTINE X 2)
Culture: NO GROWTH
Culture: NO GROWTH
Special Requests: ADEQUATE
Special Requests: ADEQUATE

## 2021-05-08 LAB — CBC WITH DIFFERENTIAL/PLATELET
Abs Immature Granulocytes: 0.72 10*3/uL — ABNORMAL HIGH (ref 0.00–0.07)
Basophils Absolute: 0 10*3/uL (ref 0.0–0.1)
Basophils Relative: 0 %
Eosinophils Absolute: 0 10*3/uL (ref 0.0–0.5)
Eosinophils Relative: 0 %
HCT: 32.9 % — ABNORMAL LOW (ref 39.0–52.0)
Hemoglobin: 11.2 g/dL — ABNORMAL LOW (ref 13.0–17.0)
Immature Granulocytes: 4 %
Lymphocytes Relative: 2 %
Lymphs Abs: 0.4 10*3/uL — ABNORMAL LOW (ref 0.7–4.0)
MCH: 32 pg (ref 26.0–34.0)
MCHC: 34 g/dL (ref 30.0–36.0)
MCV: 94 fL (ref 80.0–100.0)
Monocytes Absolute: 1 10*3/uL (ref 0.1–1.0)
Monocytes Relative: 5 %
Neutro Abs: 18.2 10*3/uL — ABNORMAL HIGH (ref 1.7–7.7)
Neutrophils Relative %: 89 %
Platelets: 347 10*3/uL (ref 150–400)
RBC: 3.5 MIL/uL — ABNORMAL LOW (ref 4.22–5.81)
RDW: 13.2 % (ref 11.5–15.5)
WBC: 20.3 10*3/uL — ABNORMAL HIGH (ref 4.0–10.5)
nRBC: 0 % (ref 0.0–0.2)

## 2021-05-08 LAB — COMPREHENSIVE METABOLIC PANEL
ALT: 44 U/L (ref 0–44)
AST: 33 U/L (ref 15–41)
Albumin: 2 g/dL — ABNORMAL LOW (ref 3.5–5.0)
Alkaline Phosphatase: 58 U/L (ref 38–126)
Anion gap: 6 (ref 5–15)
BUN: 31 mg/dL — ABNORMAL HIGH (ref 8–23)
CO2: 27 mmol/L (ref 22–32)
Calcium: 7.9 mg/dL — ABNORMAL LOW (ref 8.9–10.3)
Chloride: 102 mmol/L (ref 98–111)
Creatinine, Ser: 1.15 mg/dL (ref 0.61–1.24)
GFR, Estimated: >60 mL/min (ref >60)
Glucose, Bld: 150 mg/dL — ABNORMAL HIGH (ref 70–99)
Potassium: 4.7 mmol/L (ref 3.5–5.1)
Sodium: 135 mmol/L (ref 135–145)
Total Bilirubin: 0.8 mg/dL (ref 0.3–1.2)
Total Protein: 5.9 g/dL — ABNORMAL LOW (ref 6.5–8.1)

## 2021-05-08 LAB — C-REACTIVE PROTEIN: CRP: 2.7 mg/dL — ABNORMAL HIGH (ref <1.0)

## 2021-05-08 LAB — HEPARIN LEVEL (UNFRACTIONATED)
Heparin Unfractionated: 0.63 IU/mL (ref 0.30–0.70)
Heparin Unfractionated: 0.71 IU/mL — ABNORMAL HIGH (ref 0.30–0.70)

## 2021-05-08 LAB — MAGNESIUM: Magnesium: 2.3 mg/dL (ref 1.7–2.4)

## 2021-05-08 LAB — D-DIMER, QUANTITATIVE: D-Dimer, Quant: 8.32 ug/mL-FEU — ABNORMAL HIGH (ref 0.00–0.50)

## 2021-05-08 MED ORDER — APIXABAN 5 MG PO TABS
10.0000 mg | ORAL_TABLET | Freq: Two times a day (BID) | ORAL | Status: DC
Start: 2021-05-08 — End: 2021-05-11
  Administered 2021-05-08 – 2021-05-11 (×7): 10 mg via ORAL
  Filled 2021-05-08 (×7): qty 2

## 2021-05-08 MED ORDER — APIXABAN 5 MG PO TABS: 5.0000 mg | ORAL_TABLET | Freq: Two times a day (BID) | ORAL | Status: DC

## 2021-05-08 NOTE — Plan of Care (Signed)

## 2021-05-08 NOTE — Consult Note (Signed)
ANTICOAGULATION CONSULT NOTE  Pharmacy Consult for heparin drip Indication: pulmonary embolus  Patient Measurements: Heparin Dosing Weight: 71.2kg  Labs: Recent Labs    05/06/21 0651 06/15/2021 0611 05/08/21 0158  HGB 11.6* 12.1*  --   HCT 34.1* 36.1*  --   PLT 320 353  --   HEPARINUNFRC 0.69 0.65 0.63  CREATININE 1.19 1.30*  --     Estimated Creatinine Clearance: 45.2 mL/min (A) (by C-G formula based on SCr of 1.3 mg/dL (H)).  Medications:  No PTA anticoagulation of note Heparin Dosing Weight: 71.2kg  Assessment: 85 yo male admitted with acute hypoxic respiratory failure, COVID+ PNA, and elevated D-Dimer. CTA 10/12 impression of extensive PE with associated right heart strain.   Baseline Labs: aPTT - 31s INR - 1.5 Hgb - 14.1 Plts - 315  Date Time HL Rate/Comment 10/11 0605 0.14 Subthera; 1150 un/hr 10/11 1547 0.40 Thera x 1 , 1400 un/hr  10/12  0054    0.23     Subthera , 1400 un/hr 10/12 1055 0.68 Thera  x1; 1550 un/hr  10/12 1957 0.70 Thera  x 2; 1550 un/hr 10/13  0651 0.69 Thera x 3, 1550 un/hr  10/14   0611   0.65     Thera X 4, 1550 units/hr 10/15   0158   0.63     Thera X 1 after restart   Goal of Therapy:  Heparin level 0.3-0.7 units/ml Monitor platelets by anticoagulation protocol: Yes   Plan: 10/15:  HL @ 0158 = 0.63, therapeutic X 1 following restart Will draw confirmation level on 10/15 @ 1000.  Arjun Hard D 05/08/2021 2:34 AM

## 2021-05-08 NOTE — Consult Note (Deleted)
ANTICOAGULATION CONSULT NOTE  Pharmacy Consult for heparin drip Indication: pulmonary embolus  Patient Measurements: Heparin Dosing Weight: 71.2kg  Labs: Recent Labs    05/06/21 0651 05/22/2021 0611 05/08/21 0158 05/08/21 0524 05/08/21 1005  HGB 11.6* 12.1*  --  11.2*  --   HCT 34.1* 36.1*  --  32.9*  --   PLT 320 353  --  347  --   HEPARINUNFRC 0.69 0.65 0.63  --  0.71*  CREATININE 1.19 1.30*  --  1.15  --     Estimated Creatinine Clearance: 51.1 mL/min (by C-G formula based on SCr of 1.15 mg/dL).  Medications:  No PTA anticoagulation of note Heparin Dosing Weight: 71.2kg  Assessment: 85 yo male admitted with acute hypoxic respiratory failure, COVID+ PNA, and elevated D-Dimer. CTA 10/12 impression of extensive PE with associated right heart strain.   Baseline Labs: aPTT - 31s INR - 1.5 Hgb - 14.1 Plts - 315  Date Time HL Rate/Comment 10/11 0605 0.14 Subthera; 1150 un/hr 10/11 1547 0.40 Thera x 1 , 1400 un/hr  10/12  0054    0.23     Subthera , 1400 un/hr 10/12 1055 0.68 Thera  x1; 1550 un/hr  10/12 1957 0.70 Thera  x 2; 1550 un/hr 10/13  0651 0.69 Thera x 3, 1550 un/hr  10/14   0611   0.65     Thera X 4, 1550 units/hr 10/15   0158   0.63     Thera X 1 after restart  10/15 1005 0.71 Suprathera, 1550>1500 units/hr  Goal of Therapy:  Heparin level 0.3-0.7 units/ml Monitor platelets by anticoagulation protocol: Yes   Plan: Heparin slightly supratherapeutic.  Decrease heparin infusion to 1500 units/hr Recheck heparin level 8 hours following rate change  CBC daily  Sharen Hones, PharmD, BCPS Clinical Pharmacist   05/08/2021 11:24 AM

## 2021-05-08 NOTE — Progress Notes (Signed)
PROGRESS NOTE    Jonathan Salinas  EXB:[id number] DOB: [birth year] DOA: 05/02/2021 PCP: [name], MD  Outpatient Specialists: none    Brief Narrative:   From admission h and p: This is an 85 yo male who presented to [facility] ER on 10/10 via EMS with c/o shortness of breath onset 09/29.Marland Kitchen  He was evaluated at [facility] on 10/3, and initially treated with doxycycline 100 mg bid for suspected bronchitis pending COVID-19 test results.  Pt is unvaccinated.  His COVID-19 results came back positive, however he was out of the window for antiviral treatment, and provider recommended supportive care.  On 10/10 pt reported each time he took the doxycycline as prescribed he vomited and he continued to have worsening shortness of breath, therefore EMS notified. EMS reported upon their arrival at pts home his O2 sats were 68% on RA.  EMS placed pt on NRB @15L , pt received  solumedrol and duonebs with O2 sats increasing to the 80's.   ED Course: Upon arrival to the ER pt remained hypoxic with O2 sats in the low to mid 80's requiring transition to 100% HHFNC @60  L/min.  CXR concerning for multifocal pneumonia.  Lab results revealed wbc 21.0 and fibrinogen 535.  Pt received cefepime and vancomycin.  PCCM team contacted for ICU admission.        Assessment & Plan:   Active Problems:   COVID-19   Pulmonary embolism (HCC)   DVT (deep venous thrombosis) (HCC)  # Acute hypoxic respiratory failure # Covid pneumonia O2 80s on arrival, initially requiring 100% hfnc @ 60. Now weaned down to 15 L HFNC. Symptoms began 9/29. Treated w/ doxycycline as outpatient. Positive test 10/3. PE contributing. - continue methylpred (started 10/10), decrease today from 60 bid to 40 bid, will plan to continue to taper.  - started baricitinib (start 10/12), plan for up to 14 days - will need up to 20 day quarantine - stopped cefepime, no evidence pneumonia  # Pulmonary embolism # DVT Significant right-sided PE.  Evidence right heart strain; TTE shows low-normal RV function. B/l distal tibial dvt on lower extremity PVLs. Thrombectomy/thrombolysis procedure with vascular on 10/14.  - continue heparin IV for now, have messaged vascular about when to transition to oral anticoagulation  # HTN Here bp mildly elevated - resumed home lisinopril  # Transaminitis Mild. resolved - monitor for now   DVT prophylaxis: heparin Code Status: full Family Communication: son 12/12 updated @ bedside 10/15  Level of care: progressive Status is: Inpatient  Remains inpatient appropriate because:Inpatient level of care appropriate due to severity of illness  Dispo: The patient is from: Home              Anticipated d/c is to: Home              Patient currently is not medically stable to d/c.   Difficult to place patient No     Consultants:  none  Procedures: none  Antimicrobials:  S/p cefepime    Subjective: This morning breathing stable. No chest pain. Tolerated yesterday's procedure.  Objective: Vitals:   05/08/21 0600 05/08/21 0800 05/08/21 0813 05/08/21 1000  BP: 132/71 134/79  (!) 159/80  Pulse: 69 66  80  Resp: (!) 21 18  (!) 21  Temp:  97.7 F (36.5 C)    TempSrc:  Axillary    SpO2: 96% 94% 94% 96%  Weight:      Height:        Intake/Output  Summary (Last 24 hours) at 05/08/2021 1110 Last data filed at 05/08/2021 1000 Gross per 24 hour  Intake 1571.56 ml  Output 2100 ml  Net -528.44 ml   Filed Weights   05/06/21 0500 04/24/2021 0440 05/08/21 0204  Weight: 74.6 kg 75.4 kg 74.3 kg    Examination:  General exam: Appears calm and comfortable. Hard of hearing. Respiratory system: scattered rhonchi and rales Cardiovascular system: S1 & S2 heard, RRR. No JVD, murmurs, rubs, gallops or clicks. No pedal edema. Gastrointestinal system: Abdomen is nondistended, soft and nontender. No organomegaly or masses felt. Normal bowel sounds heard. Central nervous system: Alert and oriented.  No focal neurological deficits. Extremities: Symmetric 5 x 5 power. Skin: No rashes, lesions or ulcers Psychiatry: Judgement and insight appear normal. Mood & affect appropriate.     Data Reviewed: I have personally reviewed following labs and imaging studies  CBC: Recent Labs  Lab 05/04/21 0605 05/05/21 0054 05/06/21 0651 05/23/2021 0611 05/08/21 0524  WBC 14.8* 19.0* 15.6* 21.9* 20.3*  NEUTROABS 13.4* 17.1* 14.1* 20.1* 18.2*  HGB 12.2* 11.0* 11.6* 12.1* 11.2*  HCT 34.0* 31.5* 34.1* 36.1* 32.9*  MCV 90.2 92.1 91.9 94.3 94.0  PLT 254 278 320 353 347   Basic Metabolic Panel: Recent Labs  Lab 05-12-2021 1746 05/04/21 0605 05/05/21 0054 05/06/21 0651 05/01/2021 0611 05/08/21 0524  NA  --  136 134* 136 135 135  K  --  3.8 3.4* 4.4 4.5 4.7  CL  --  103 98 100 103 102  CO2  --  23 26 27 25 27   GLUCOSE  --  129* 133* 154* 160* 150*  BUN  --  35* 32* 27* 38* 31*  CREATININE 1.52* 1.27* 1.06 1.19 1.30* 1.15  CALCIUM  --  8.0* 7.0* 7.8* 7.9* 7.9*  MG 2.2 2.2 2.2 2.5* 2.5* 2.3  PHOS 4.1 4.0 2.9  --   --   --    GFR: Estimated Creatinine Clearance: 51.1 mL/min (by C-G formula based on SCr of 1.15 mg/dL). Liver Function Tests: Recent Labs  Lab 05/04/21 0605 05/05/21 0054 05/06/21 0651 05/16/2021 0611 05/08/21 0524  AST 27 42* 28 87* 33  ALT 23 26 24  61* 44  ALKPHOS 57 49 50 64 58  BILITOT 1.0 0.7 0.7 0.6 0.8  PROT 6.2* 5.4* 5.9* 6.2* 5.9*  ALBUMIN 1.9* 1.7* 1.9* 2.0* 2.0*   No results for input(s): LIPASE, AMYLASE in the last 168 hours. No results for input(s): AMMONIA in the last 168 hours. Coagulation Profile: Recent Labs  Lab May 12, 2021 1940  INR 1.5*   Cardiac Enzymes: No results for input(s): CKTOTAL, CKMB, CKMBINDEX, TROPONINI in the last 168 hours. BNP (last 3 results) No results for input(s): PROBNP in the last 8760 hours. HbA1C: No results for input(s): HGBA1C in the last 72 hours.  CBG: Recent Labs  Lab 05/04/21 1551 05/04/21 1912 05/04/21 2311  05/05/21 0301 05/05/21 0751  GLUCAP 147* 133* 118* 124* 122*   Lipid Profile: No results for input(s): CHOL, HDL, LDLCALC, TRIG, CHOLHDL, LDLDIRECT in the last 72 hours.  Thyroid Function Tests: No results for input(s): TSH, T4TOTAL, FREET4, T3FREE, THYROIDAB in the last 72 hours.  Anemia Panel: No results for input(s): VITAMINB12, FOLATE, FERRITIN, TIBC, IRON, RETICCTPCT in the last 72 hours.  Urine analysis:    Component Value Date/Time   BILIRUBINUR neg 11/20/2015 1511   PROTEINUR neg 11/20/2015 1511   UROBILINOGEN 0.2 11/20/2015 1511   NITRITE neg 11/20/2015 1511   LEUKOCYTESUR Negative 11/20/2015 1511  Sepsis Labs: @LABRCNTIP (procalcitonin:4,lacticidven:4)  ) Recent Results (from the past 240 hour(s))  Blood Culture (routine x 2)     Status: None   Collection Time: May 21, 2021  5:33 PM   Specimen: BLOOD  Result Value Ref Range Status   Specimen Description BLOOD BLOOD LEFT FOREARM  Final   Special Requests   Final    BOTTLES DRAWN AEROBIC AND ANAEROBIC Blood Culture adequate volume   Culture   Final    NO GROWTH 5 DAYS Performed at [facility], [address], [place], [place]    Report Status 05/08/2021 FINAL  Final  Blood Culture (routine x 2)     Status: None   Collection Time: May 21, 2021  5:40 PM   Specimen: BLOOD  Result Value Ref Range Status   Specimen Description BLOOD BLOOD RIGHT FOREARM  Final   Special Requests   Final    BOTTLES DRAWN AEROBIC AND ANAEROBIC Blood Culture adequate volume   Culture   Final    NO GROWTH 5 DAYS Performed at [facility], [address] [place], [place]    Report Status 05/08/2021 FINAL  Final  MRSA Next Gen by PCR, Nasal     Status: None   Collection Time: 2021-05-21  7:07 PM   Specimen: Nasal Mucosa; Nasal Swab  Result Value Ref Range Status   MRSA by PCR Next Gen NOT DETECTED NOT DETECTED Final    Comment: (NOTE) The GeneXpert MRSA Assay (FDA approved for NASAL specimens  only), is one component of a comprehensive MRSA colonization surveillance program. It is not intended to diagnose MRSA infection nor to guide or monitor treatment for MRSA infections. Test performance is not FDA approved in patients less than 15 years old. Performed at [facility], [address] [place], [place]          Radiology Studies: PERIPHERAL VASCULAR CATHETERIZATION  Result Date: 05/22/2021 See surgical note for result.  05/09/2021 Venous Img Lower Bilateral (DVT)  Result Date: 05/06/2021 CLINICAL DATA:  Bilateral lower extremity pain. Pulmonary embolism. History of COVID. Evaluate for lower extremity DVT. EXAM: BILATERAL LOWER EXTREMITY VENOUS DOPPLER ULTRASOUND TECHNIQUE: Gray-scale sonography with graded compression, as well as color Doppler and duplex ultrasound were performed to evaluate the lower extremity deep venous systems from the level of the common femoral vein and including the common femoral, femoral, profunda femoral, popliteal and calf veins including the posterior tibial, peroneal and gastrocnemius veins when visible. The superficial great saphenous vein was also interrogated. Spectral Doppler was utilized to evaluate flow at rest and with distal augmentation maneuvers in the common femoral, femoral and popliteal veins. COMPARISON:  None. FINDINGS: RIGHT LOWER EXTREMITY Common Femoral Vein: No evidence of thrombus. Normal compressibility, respiratory phasicity and response to augmentation. Saphenofemoral Junction: No evidence of thrombus. Normal compressibility and flow on color Doppler imaging. Profunda Femoral Vein: No evidence of thrombus. Normal compressibility and flow on color Doppler imaging. Femoral Vein: No evidence of thrombus. Normal compressibility, respiratory phasicity and response to augmentation. Popliteal Vein: No evidence of thrombus. Normal compressibility, respiratory phasicity and response to augmentation. Calf Veins: No evidence of  thrombus. Normal compressibility and flow on color Doppler imaging. Superficial Great Saphenous Vein: No evidence of thrombus. Normal compressibility. Other Findings: Note is made of an approximately 3.0 x 2.8 x 0.6 cm minimally complex fluid collection with the right popliteal fossa compatible with a Baker's cyst. LEFT LOWER EXTREMITY Common Femoral Vein: No evidence of thrombus. Normal compressibility, respiratory phasicity  and response to augmentation. Saphenofemoral Junction: No evidence of thrombus. Normal compressibility and flow on color Doppler imaging. Profunda Femoral Vein: No evidence of thrombus. Normal compressibility and flow on color Doppler imaging. Femoral Vein: No evidence of thrombus. Normal compressibility, respiratory phasicity and response to augmentation. Popliteal Vein: No evidence of thrombus. Normal compressibility, respiratory phasicity and response to augmentation. Calf Veins: The examination is positive for occlusive DVT involving both paired posterior tibial veins (images 60 and 61). The peroneal veins appear patent where imaged. Superficial Great Saphenous Vein: No evidence of thrombus. Normal compressibility. Other Findings:  None. IMPRESSION: 1. Examination is positive for occlusive DVT involving both paired left posterior tibial veins. There is no extension of this distal tibial DVT to the more proximal venous system of the left lower extremity. 2. No evidence of DVT within the right lower extremity. Electronically Signed   By: Simonne Come M.D.   On: 05/09/2021 07:39        Scheduled Meds:  albuterol  2 puff Inhalation Q6H   baricitinib  4 mg Oral Daily   Chlorhexidine Gluconate Cloth  6 each Topical Q0600   feeding supplement  237 mL Oral TID BM   lisinopril  10 mg Oral Daily   mouth rinse  15 mL Mouth Rinse BID   methylPREDNISolone (SOLU-MEDROL) injection  40 mg Intravenous Q12H   multivitamin with minerals  1 tablet Oral Daily   sodium chloride flush  3 mL  Intravenous Q12H   Continuous Infusions:  sodium chloride     sodium chloride Stopped (05/06/2021 1434)   heparin 1,550 Units/hr (05/08/21 1000)     LOS: 5 days    Time spent: 25 min    Silvano Bilis, MD [facility]   If 7PM-7AM, please contact night-coverage [email or web address] Password [email or web address] 05/08/2021, 11:10 AM

## 2021-05-08 NOTE — Consult Note (Signed)
ANTICOAGULATION CONSULT NOTE  Pharmacy Consult for heparin drip Indication: pulmonary embolus  Patient Measurements: Heparin Dosing Weight: 71.2kg  Labs: Recent Labs    05/06/21 0651 05/05/2021 0611 05/08/21 0158 05/08/21 0524 05/08/21 1005  HGB 11.6* 12.1*  --  11.2*  --   HCT 34.1* 36.1*  --  32.9*  --   PLT 320 353  --  347  --   HEPARINUNFRC 0.69 0.65 0.63  --  0.71*  CREATININE 1.19 1.30*  --  1.15  --     Estimated Creatinine Clearance: 51.1 mL/min (by C-G formula based on SCr of 1.15 mg/dL).  Medications:  No PTA anticoagulation of note Heparin Dosing Weight: 71.2kg  Assessment: 85 yo male admitted with acute hypoxic respiratory failure, COVID+ PNA, and elevated D-Dimer. CTA 10/12 impression of extensive PE with associated right heart strain.   Baseline Labs: aPTT - 31s INR - 1.5 Hgb - 14.1 Plts - 315  Date Time HL Rate/Comment 10/11 0605 0.14 Subthera; 1150 un/hr 10/11 1547 0.40 Thera x 1 , 1400 un/hr  10/12  0054    0.23     Subthera , 1400 un/hr 10/12 1055 0.68 Thera  x1; 1550 un/hr  10/12 1957 0.70 Thera  x 2; 1550 un/hr 10/13  0651 0.69 Thera x 3, 1550 un/hr  10/14   0611   0.65     Thera X 4, 1550 units/hr 10/15   0158   0.63     Thera X 1 after restart(at 1550 u/hr) 10/15 1005 0.71 Supra, will decrease to 1450 units/hr   Goal of Therapy:  Heparin level 0.3-0.7 units/ml Monitor platelets by anticoagulation protocol: Yes   Plan: 10/15:  HL @ 1005 = 0.71, supratherapeutic-slightly  Will decrease Heparin drip to 1450 units/hr Will check HL in 8 hrs  Jonathan Salinas A 05/08/2021 11:37 AM

## 2021-05-09 DIAGNOSIS — U071 COVID-19: Secondary | ICD-10-CM | POA: Diagnosis not present

## 2021-05-09 LAB — CBC
HCT: 35.9 % — ABNORMAL LOW (ref 39.0–52.0)
Hemoglobin: 12.5 g/dL — ABNORMAL LOW (ref 13.0–17.0)
MCH: 32.7 pg (ref 26.0–34.0)
MCHC: 34.8 g/dL (ref 30.0–36.0)
MCV: 94 fL (ref 80.0–100.0)
Platelets: 356 10*3/uL (ref 150–400)
RBC: 3.82 MIL/uL — ABNORMAL LOW (ref 4.22–5.81)
RDW: 13.3 % (ref 11.5–15.5)
WBC: 20.2 10*3/uL — ABNORMAL HIGH (ref 4.0–10.5)
nRBC: 0 % (ref 0.0–0.2)

## 2021-05-09 LAB — BASIC METABOLIC PANEL
Anion gap: 7 (ref 5–15)
BUN: 34 mg/dL — ABNORMAL HIGH (ref 8–23)
CO2: 24 mmol/L (ref 22–32)
Calcium: 8.3 mg/dL — ABNORMAL LOW (ref 8.9–10.3)
Chloride: 101 mmol/L (ref 98–111)
Creatinine, Ser: 1.25 mg/dL — ABNORMAL HIGH (ref 0.61–1.24)
GFR, Estimated: 57 mL/min — ABNORMAL LOW (ref >60)
Glucose, Bld: 163 mg/dL — ABNORMAL HIGH (ref 70–99)
Potassium: 5.1 mmol/L (ref 3.5–5.1)
Sodium: 132 mmol/L — ABNORMAL LOW (ref 135–145)

## 2021-05-09 LAB — PROTIME-INR
INR: 1.6 — ABNORMAL HIGH (ref 0.8–1.2)
Prothrombin Time: 19.1 seconds — ABNORMAL HIGH (ref 11.4–15.2)

## 2021-05-09 LAB — D-DIMER, QUANTITATIVE: D-Dimer, Quant: 7.49 ug/mL-FEU — ABNORMAL HIGH (ref 0.00–0.50)

## 2021-05-09 LAB — MAGNESIUM: Magnesium: 2.4 mg/dL (ref 1.7–2.4)

## 2021-05-09 LAB — PHOSPHORUS: Phosphorus: 4.1 mg/dL (ref 2.5–4.6)

## 2021-05-09 LAB — GLUCOSE, CAPILLARY
Glucose-Capillary: 125 mg/dL — ABNORMAL HIGH (ref 70–99)
Glucose-Capillary: 164 mg/dL — ABNORMAL HIGH (ref 70–99)

## 2021-05-09 LAB — C-REACTIVE PROTEIN: CRP: 4.7 mg/dL — ABNORMAL HIGH (ref <1.0)

## 2021-05-09 LAB — APTT: aPTT: 31 seconds (ref 24–36)

## 2021-05-09 MED ORDER — AMLODIPINE BESYLATE 10 MG PO TABS
10.0000 mg | ORAL_TABLET | Freq: Every day | ORAL | Status: DC
  Administered 2021-05-09 – 2021-05-12 (×4): 10 mg via ORAL
  Filled 2021-05-09 (×4): qty 1

## 2021-05-09 MED ORDER — DEXAMETHASONE 6 MG PO TABS
6.0000 mg | ORAL_TABLET | Freq: Every day | ORAL | Status: DC
  Administered 2021-05-10: 6 mg via ORAL
  Filled 2021-05-09 (×2): qty 1

## 2021-05-09 MED ORDER — ALBUTEROL SULFATE HFA 108 (90 BASE) MCG/ACT IN AERS
2.0000 | INHALATION_SPRAY | Freq: Four times a day (QID) | RESPIRATORY_TRACT | Status: DC | PRN
  Administered 2021-05-10: 2 via RESPIRATORY_TRACT
  Filled 2021-05-09: qty 6.7

## 2021-05-09 MED ORDER — GERHARDT'S BUTT CREAM
TOPICAL_CREAM | Freq: Two times a day (BID) | CUTANEOUS | Status: DC
  Administered 2021-05-14 – 2021-05-18 (×6): 1 via TOPICAL
  Filled 2021-05-09: qty 1

## 2021-05-09 MED ORDER — METHYLPREDNISOLONE SODIUM SUCC 40 MG IJ SOLR
40.0000 mg | Freq: Two times a day (BID) | INTRAMUSCULAR | Status: AC
  Administered 2021-05-09 (×2): 40 mg via INTRAVENOUS
  Filled 2021-05-09 (×2): qty 1

## 2021-05-09 NOTE — Progress Notes (Addendum)
PROGRESS NOTE    Jonathan Salinas  ZJI:[id number] DOB: 1935/09/07 DOA: 27-May-2021 PCP: [name], MD  Outpatient Specialists: none    Brief Narrative:   From admission h and p: This is an 85 yo male who presented to [facility] ER on 10/10 via EMS with c/o shortness of breath onset 09/29.Marland Kitchen  He was evaluated at [facility] on 10/3, and initially treated with doxycycline 100 mg bid for suspected bronchitis pending COVID-19 test results.  Pt is unvaccinated.  His COVID-19 results came back positive, however he was out of the window for antiviral treatment, and provider recommended supportive care.  On 10/10 pt reported each time he took the doxycycline as prescribed he vomited and he continued to have worsening shortness of breath, therefore EMS notified. EMS reported upon their arrival at pts home his O2 sats were 68% on RA.  EMS placed pt on NRB @15L , pt received  solumedrol and duonebs with O2 sats increasing to the 80's.   ED Course: Upon arrival to the ER pt remained hypoxic with O2 sats in the low to mid 80's requiring transition to 100% HHFNC @60  L/min.  CXR concerning for multifocal pneumonia.  Lab results revealed wbc 21.0 and fibrinogen 535.  Pt received cefepime and vancomycin.  PCCM team contacted for ICU admission.        Assessment & Plan:   Active Problems:   COVID-19   Pulmonary embolism (HCC)   DVT (deep venous thrombosis) (HCC)  # Acute hypoxic respiratory failure # Covid pneumonia O2 80s on arrival, initially requiring 100% hfnc @ 60.  Symptoms began 9/29. Treated w/ doxycycline as outpatient. Positive test 10/3. PE contributing. Still requiring high flow nasal cannula - continue methylpred (started 10/10), decreased from 60 to 40 bid on 10/15.  Will transition to oral dex tomorrow - started baricitinib (start 10/12), plan for up to 14 days - will need up to 20 day quarantine  # Pulmonary embolism # DVT Significant right-sided PE. Evidence right heart  strain; TTE shows low-normal RV function. B/l distal tibial dvt on lower extremity PVLs. Thrombectomy/thrombolysis procedure with vascular on 10/14.  - transitioned to eliquis on 10/15  # HTN Here bp moderately elevated. K increased to 5.1 today - substitute amlodipine for home lisinopril  # Transaminitis Mild. resolved - monitor for now   DVT prophylaxis: eliquis Code Status: full Family Communication: son 11/14 updated @ bedside 10/15  Level of care: progressive Status is: Inpatient  Remains inpatient appropriate because:Inpatient level of care appropriate due to severity of illness  Dispo: The patient is from: Home              Anticipated d/c is to: Home              Patient currently is not medically stable to d/c.   Difficult to place patient No     Consultants:  none  Procedures: none  Antimicrobials:  S/p cefepime    Subjective: This morning tachypnic when arriving on progressive unit, respiratory addressing with heated high flow  Objective: Vitals:   05/09/21 0828 05/09/21 0918 05/09/21 0921 05/09/21 0940  BP: 135/85   (!) 170/110  Pulse: (!) 112 (!) 112  (!) 118  Resp: (!) 39 (!) 27 (!) 27 (!) 34  Temp:      TempSrc:      SpO2:  (!) 83%  (!) 82%  Weight:      Height:        Intake/Output Summary (  Last 24 hours) at 05/09/2021 0952 Last data filed at 05/09/2021 0554 Gross per 24 hour  Intake 1807.17 ml  Output 3350 ml  Net -1542.83 ml   Filed Weights   26-May-2021 0440 05/08/21 0204 05/09/21 0455  Weight: 75.4 kg 74.3 kg 72.4 kg    Examination:  General exam: Appears calm and comfortable. Hard of hearing. Respiratory system: scattered rhonchi and rales, tachypnic Cardiovascular system: S1 & S2 heard, mild tachycardia. No JVD, murmurs, rubs, gallops or clicks. No pedal edema. Gastrointestinal system: Abdomen is nondistended, soft and nontender. No organomegaly or masses felt. Normal bowel sounds heard. Central nervous system: Alert and  oriented. No focal neurological deficits. Extremities: Symmetric 5 x 5 power. Skin: No rashes, lesions or ulcers Psychiatry: Judgement and insight appear normal. Mood & affect appropriate.     Data Reviewed: I have personally reviewed following labs and imaging studies  CBC: Recent Labs  Lab 05/04/21 0605 05/05/21 0054 05/06/21 0651 26-May-2021 0611 05/08/21 0524 05/09/21 0456  WBC 14.8* 19.0* 15.6* 21.9* 20.3* 20.2*  NEUTROABS 13.4* 17.1* 14.1* 20.1* 18.2*  --   HGB 12.2* 11.0* 11.6* 12.1* 11.2* 12.5*  HCT 34.0* 31.5* 34.1* 36.1* 32.9* 35.9*  MCV 90.2 92.1 91.9 94.3 94.0 94.0  PLT 254 278 320 353 347 356   Basic Metabolic Panel: Recent Labs  Lab 05/08/2021 1746 05/04/21 0605 05/05/21 0054 05/06/21 0651 05-26-2021 0611 05/08/21 0524 05/09/21 0456  NA  --  136 134* 136 135 135 132*  K  --  3.8 3.4* 4.4 4.5 4.7 5.1  CL  --  103 98 100 103 102 101  CO2  --  23 26 27 25 27 24   GLUCOSE  --  129* 133* 154* 160* 150* 163*  BUN  --  35* 32* 27* 38* 31* 34*  CREATININE 1.52* 1.27* 1.06 1.19 1.30* 1.15 1.25*  CALCIUM  --  8.0* 7.0* 7.8* 7.9* 7.9* 8.3*  MG 2.2 2.2 2.2 2.5* 2.5* 2.3 2.4  PHOS 4.1 4.0 2.9  --   --   --  4.1   GFR: Estimated Creatinine Clearance: 45.9 mL/min (A) (by C-G formula based on SCr of 1.25 mg/dL (H)). Liver Function Tests: Recent Labs  Lab 05/04/21 0605 05/05/21 0054 05/06/21 0651 05-26-2021 0611 05/08/21 0524  AST 27 42* 28 87* 33  ALT 23 26 24  61* 44  ALKPHOS 57 49 50 64 58  BILITOT 1.0 0.7 0.7 0.6 0.8  PROT 6.2* 5.4* 5.9* 6.2* 5.9*  ALBUMIN 1.9* 1.7* 1.9* 2.0* 2.0*   No results for input(s): LIPASE, AMYLASE in the last 168 hours. No results for input(s): AMMONIA in the last 168 hours. Coagulation Profile: Recent Labs  Lab 05/13/2021 1940 05/09/21 0456  INR 1.5* 1.6*   Cardiac Enzymes: No results for input(s): CKTOTAL, CKMB, CKMBINDEX, TROPONINI in the last 168 hours. BNP (last 3 results) No results for input(s): PROBNP in the last 8760  hours. HbA1C: No results for input(s): HGBA1C in the last 72 hours.  CBG: Recent Labs  Lab 05/04/21 1551 05/04/21 1912 05/04/21 2311 05/05/21 0301 05/05/21 0751  GLUCAP 147* 133* 118* 124* 122*   Lipid Profile: No results for input(s): CHOL, HDL, LDLCALC, TRIG, CHOLHDL, LDLDIRECT in the last 72 hours.  Thyroid Function Tests: No results for input(s): TSH, T4TOTAL, FREET4, T3FREE, THYROIDAB in the last 72 hours.  Anemia Panel: No results for input(s): VITAMINB12, FOLATE, FERRITIN, TIBC, IRON, RETICCTPCT in the last 72 hours.  Urine analysis:    Component Value Date/Time  BILIRUBINUR neg 11/20/2015 1511   PROTEINUR neg 11/20/2015 1511   UROBILINOGEN 0.2 11/20/2015 1511   NITRITE neg 11/20/2015 1511   LEUKOCYTESUR Negative 11/20/2015 1511   Sepsis Labs: @LABRCNTIP (procalcitonin:4,lacticidven:4)  ) Recent Results (from the past 240 hour(s))  Blood Culture (routine x 2)     Status: None   Collection Time: 05/02/2021  5:33 PM   Specimen: BLOOD  Result Value Ref Range Status   Specimen Description BLOOD BLOOD LEFT FOREARM  Final   Special Requests   Final    BOTTLES DRAWN AEROBIC AND ANAEROBIC Blood Culture adequate volume   Culture   Final    NO GROWTH 5 DAYS Performed at [facility], [address], [place], [place]    Report Status 05/08/2021 FINAL  Final  Blood Culture (routine x 2)     Status: None   Collection Time: 04/24/2021  5:40 PM   Specimen: BLOOD  Result Value Ref Range Status   Specimen Description BLOOD BLOOD RIGHT FOREARM  Final   Special Requests   Final    BOTTLES DRAWN AEROBIC AND ANAEROBIC Blood Culture adequate volume   Culture   Final    NO GROWTH 5 DAYS Performed at [facility], [address], [place], [place]    Report Status 05/08/2021 FINAL  Final  MRSA Next Gen by PCR, Nasal     Status: None   Collection Time: 05/24/2021  7:07 PM   Specimen: Nasal Mucosa; Nasal Swab  Result Value Ref Range Status    MRSA by PCR Next Gen NOT DETECTED NOT DETECTED Final    Comment: (NOTE) The GeneXpert MRSA Assay (FDA approved for NASAL specimens only), is one component of a comprehensive MRSA colonization surveillance program. It is not intended to diagnose MRSA infection nor to guide or monitor treatment for MRSA infections. Test performance is not FDA approved in patients less than 3 years old. Performed at [facility], [address] [place], [place]          Radiology Studies: PERIPHERAL VASCULAR CATHETERIZATION  Result Date: May 30, 2021 See surgical note for result.       Scheduled Meds:  albuterol  2 puff Inhalation Q6H   amLODipine  10 mg Oral Daily   apixaban  10 mg Oral BID   Followed by   05/09/2021 ON 05/15/2021] apixaban  5 mg Oral BID   baricitinib  4 mg Oral Daily   Chlorhexidine Gluconate Cloth  6 each Topical Q0600   feeding supplement  237 mL Oral TID BM   Gerhardt's butt cream   Topical BID   mouth rinse  15 mL Mouth Rinse BID   methylPREDNISolone (SOLU-MEDROL) injection  40 mg Intravenous Q12H   multivitamin with minerals  1 tablet Oral Daily   sodium chloride flush  3 mL Intravenous Q12H   Continuous Infusions:  sodium chloride       LOS: 6 days    Time spent: 25 min    05/17/2021, MD [facility]   If 7PM-7AM, please contact night-coverage [email or web address] Password [email or web address] 05/09/2021, 9:52 AM

## 2021-05-09 NOTE — Plan of Care (Signed)

## 2021-05-09 NOTE — Progress Notes (Signed)
0830 called report to RN for room [id number] called son left message for patient room change

## 2021-05-10 ENCOUNTER — Encounter: Payer: Self-pay | Admitting: Vascular Surgery

## 2021-05-10 ENCOUNTER — Inpatient Hospital Stay: Payer: Medicare Other

## 2021-05-10 DIAGNOSIS — I2699 Other pulmonary embolism without acute cor pulmonale: Secondary | ICD-10-CM | POA: Diagnosis not present

## 2021-05-10 LAB — BASIC METABOLIC PANEL
Anion gap: 6 (ref 5–15)
Anion gap: 10 (ref 5–15)
BUN: 44 mg/dL — ABNORMAL HIGH (ref 8–23)
BUN: 39 mg/dL — ABNORMAL HIGH (ref 8–23)
CO2: 27 mmol/L (ref 22–32)
CO2: 23 mmol/L (ref 22–32)
Calcium: 8.4 mg/dL — ABNORMAL LOW (ref 8.9–10.3)
Calcium: 8.4 mg/dL — ABNORMAL LOW (ref 8.9–10.3)
Chloride: 97 mmol/L — ABNORMAL LOW (ref 98–111)
Chloride: 97 mmol/L — ABNORMAL LOW (ref 98–111)
Creatinine, Ser: 1.26 mg/dL — ABNORMAL HIGH (ref 0.61–1.24)
Creatinine, Ser: 1.37 mg/dL — ABNORMAL HIGH (ref 0.61–1.24)
GFR, Estimated: 57 mL/min — ABNORMAL LOW (ref >60)
GFR, Estimated: 51 mL/min — ABNORMAL LOW (ref >60)
Glucose, Bld: 146 mg/dL — ABNORMAL HIGH (ref 70–99)
Glucose, Bld: 131 mg/dL — ABNORMAL HIGH (ref 70–99)
Potassium: 5.6 mmol/L — ABNORMAL HIGH (ref 3.5–5.1)
Potassium: 4.9 mmol/L (ref 3.5–5.1)
Sodium: 130 mmol/L — ABNORMAL LOW (ref 135–145)
Sodium: 130 mmol/L — ABNORMAL LOW (ref 135–145)

## 2021-05-10 LAB — CBC
HCT: 41.3 % (ref 39.0–52.0)
Hemoglobin: 13.9 g/dL (ref 13.0–17.0)
MCH: 31.6 pg (ref 26.0–34.0)
MCHC: 33.7 g/dL (ref 30.0–36.0)
MCV: 93.9 fL (ref 80.0–100.0)
Platelets: 372 10*3/uL (ref 150–400)
RBC: 4.4 MIL/uL (ref 4.22–5.81)
RDW: 13.5 % (ref 11.5–15.5)
WBC: 22.6 10*3/uL — ABNORMAL HIGH (ref 4.0–10.5)
nRBC: 0 % (ref 0.0–0.2)

## 2021-05-10 LAB — GLUCOSE, CAPILLARY
Glucose-Capillary: 133 mg/dL — ABNORMAL HIGH (ref 70–99)
Glucose-Capillary: 158 mg/dL — ABNORMAL HIGH (ref 70–99)
Glucose-Capillary: 156 mg/dL — ABNORMAL HIGH (ref 70–99)

## 2021-05-10 LAB — TROPONIN I (HIGH SENSITIVITY): Troponin I (High Sensitivity): 146 ng/L (ref <18)

## 2021-05-10 LAB — C-REACTIVE PROTEIN: CRP: 4.9 mg/dL — ABNORMAL HIGH (ref <1.0)

## 2021-05-10 LAB — BRAIN NATRIURETIC PEPTIDE: B Natriuretic Peptide: 124.3 pg/mL — ABNORMAL HIGH (ref 0.0–100.0)

## 2021-05-10 LAB — D-DIMER, QUANTITATIVE: D-Dimer, Quant: 3.4 ug/mL-FEU — ABNORMAL HIGH (ref 0.00–0.50)

## 2021-05-10 MED ORDER — INSULIN ASPART 100 UNIT/ML IV SOLN
10.0000 [IU] | Freq: Once | INTRAVENOUS | Status: DC
  Filled 2021-05-10: qty 0.1

## 2021-05-10 MED ORDER — BARICITINIB 2 MG PO TABS
2.0000 mg | ORAL_TABLET | Freq: Every day | ORAL | Status: DC
  Administered 2021-05-10 – 2021-05-11 (×2): 2 mg via ORAL
  Filled 2021-05-10 (×2): qty 1

## 2021-05-10 MED ORDER — SODIUM CHLORIDE 0.9 % IV SOLN: INTRAVENOUS | Status: DC

## 2021-05-10 MED ORDER — DEXTROSE 50 % IV SOLN
1.0000 | Freq: Once | INTRAVENOUS | Status: DC
  Filled 2021-05-10: qty 50

## 2021-05-10 MED ORDER — SODIUM ZIRCONIUM CYCLOSILICATE 10 G PO PACK
10.0000 g | PACK | Freq: Every day | ORAL | Status: DC
  Filled 2021-05-10: qty 1

## 2021-05-10 NOTE — Progress Notes (Signed)
PHARMACY NOTE: RENAL DOSAGE ADJUSTMENT  Current regimen includes a mismatch between medication dosage and estimated renal function.  As per policy approved by the Pharmacy & Therapeutics and Medical Executive Committees,dosage will be adjusted accordingly.  Current dosage:  Baricitinib 4mg  daily  Indication: COVID 19  Renal Function: Estimated Creatinine Clearance: 45.5 mL/min (A) (by C-G formula based on SCr of 1.26 mg/dL (H)).  []      On intermittent HD, scheduled: []      On CRRT    Baricitinib dosage has been changed to:  2mg  daily  Thank you for allowing pharmacy to be a part of this patient's care.  Jacoba Cherney Rodriguez-Guzman PharmD, BCPS 05/10/2021 9:36 AM

## 2021-05-10 NOTE — Consult Note (Signed)
Pulmonary Medicine          Date: 05/10/2021,   MRN# [id number] Jonathan Salinas [birth year]     AdmissionWeight: 80.7 kg                 CurrentWeight: 72.4 kg   Refferring physician: Dr Ashok Pall   CHIEF COMPLAINT:   Acute hypoxemic respiratory failure due to Massive PE and COVID19 pneumonia   HISTORY OF PRESENT ILLNESS   This is a plasant 85yo male with hx of essential HTN and GERD came in with acute hypoxemic respiratory failure due to COVID19 infection. He was evaluated at [facility] on 10/3, and initially treated with doxycycline 100 mg bid for suspected bronchitis pending COVID-19 test results.  Pt is unvaccinated.  His COVID-19 results came back positive, however he was out of the window for antiviral treatment, and provider recommended supportive care.  On 10/10 pt reported each time he took the doxycycline as prescribed he vomited and he continued to have worsening shortness of breath, therefore EMS notified. EMS reported upon their arrival at pts home his O2 sats were 68% on RA.  EMS placed pt on NRB , pt received  solumedrol and duonebs with O2 sats increasing to the 80's. Upon arrival to the ER pt remained hypoxic with O2 sats in the low to mid 80's requiring transition to 100% HHFNC  L/min.  CXR concerning for multifocal pneumonia.  Lab results revealed wbc 21.0 and fibrinogen 535.  Pt received cefepime and vancomycin. Patient was found to have bilateral multifocal pneumonia and large clot burden PE. This was further managed with anticoagulation and vascular consultation. Patient was seen by surgery and had thrombectomy performed for treatment of pulmonary venous thrombosis.    PAST MEDICAL HISTORY   Past Medical History:  Diagnosis Date   GERD (gastroesophageal reflux disease)    Hypertension      SURGICAL HISTORY   Past Surgical History:  Procedure Laterality Date   COLONOSCOPY  2010   cleared for 10 yrs   INNER EAR SURGERY     had a  perforated eardrum   PULMONARY THROMBECTOMY Bilateral 05/19/2021   Procedure: PULMONARY THROMBECTOMY;  Surgeon: Renford Dills, MD;  Location: [facility] INVASIVE CV LAB;  Service: Cardiovascular;  Laterality: Bilateral;  Covid (+)   TONSILLECTOMY       FAMILY HISTORY   Family History  Problem Relation Age of Onset   Cancer Mother    Cancer Father    Prostate cancer Neg Hx    Bladder Cancer Neg Hx    Kidney cancer Neg Hx      SOCIAL HISTORY   Social History   Tobacco Use   Smoking status: Former   Smokeless tobacco: Never  Building services engineer Use: Never used  Substance Use Topics   Alcohol use: No    Alcohol/week: 0.0 standard drinks   Drug use: No     MEDICATIONS    Home Medication:    Current Medication:  Current Facility-Administered Medications:    0.9 %  sodium chloride infusion, 250 mL, Intravenous, PRN, Schnier, Latina Craver, MD   0.9 %  sodium chloride infusion, , Intravenous, Continuous, Wouk, Wilfred Curtis, MD   acetaminophen (TYLENOL) tablet 650 mg, 650 mg, Oral, Q4H PRN, Schnier, Latina Craver, MD   albuterol (VENTOLIN HFA) 108 (90 Base) MCG/ACT inhaler 2 puff, 2 puff, Inhalation, Q6H PRN, Wouk, Wilfred Curtis, MD   amLODipine (NORVASC) tablet 10 mg, 10 mg, Oral,  Daily, Wouk, Wilfred Curtis, MD, 10 mg at 05/10/21 1024   apixaban (ELIQUIS) tablet 10 mg, 10 mg, Oral, BID, 10 mg at 05/10/21 1024 **FOLLOWED BY** [START ON 05/15/2021] apixaban (ELIQUIS) tablet 5 mg, 5 mg, Oral, BID, Dolan, Carissa E, RPH   baricitinib (OLUMIANT) tablet 2 mg, 2 mg, Oral, Daily, Wouk, Wilfred Curtis, MD, 2 mg at 05/10/21 1024   Chlorhexidine Gluconate Cloth 2 % PADS 6 each, 6 each, Topical, Q0600, Schnier, Latina Craver, MD, 6 each at 05/10/21 1025   dexamethasone (DECADRON) tablet 6 mg, 6 mg, Oral, Daily, Wouk, Wilfred Curtis, MD, 6 mg at 05/10/21 1025   insulin aspart (novoLOG) injection 10 Units, 10 Units, Intravenous, Once **AND** dextrose 50 % solution 50 mL, 1 ampule, Intravenous, Once, Wouk,  Wilfred Curtis, MD   docusate sodium (COLACE) capsule 100 mg, 100 mg, Oral, BID PRN, Schnier, Latina Craver, MD   feeding supplement (ENSURE ENLIVE / ENSURE PLUS) liquid 237 mL, 237 mL, Oral, TID BM, Schnier, Latina Craver, MD, 237 mL at 05/10/21 1025   Gerhardt's butt cream, , Topical, BID, Wouk, Wilfred Curtis, MD, Given at 05/10/21 1026   guaiFENesin-dextromethorphan (ROBITUSSIN DM) 100-10 MG/5ML syrup 10 mL, 10 mL, Oral, Q4H PRN, Schnier, Latina Craver, MD, 10 mL at 2021-05-20 2040   labetalol (NORMODYNE) injection 10 mg, 10 mg, Intravenous, Q2H PRN, Manuela Schwartz, NP, 10 mg at 05/08/21 1950   MEDLINE mouth rinse, 15 mL, Mouth Rinse, BID, Schnier, Latina Craver, MD, 15 mL at 05/10/21 1026   multivitamin with minerals tablet 1 tablet, 1 tablet, Oral, Daily, Schnier, Latina Craver, MD, 1 tablet at 05/10/21 1024   ondansetron (ZOFRAN) injection 4 mg, 4 mg, Intravenous, Q6H PRN, Schnier, Latina Craver, MD   polyethylene glycol (MIRALAX / GLYCOLAX) packet 17 g, 17 g, Oral, Daily PRN, Schnier, Latina Craver, MD   sodium chloride flush (NS) 0.9 % injection 3 mL, 3 mL, Intravenous, Q12H, Schnier, Latina Craver, MD, 3 mL at 05/10/21 1026   sodium chloride flush (NS) 0.9 % injection 3 mL, 3 mL, Intravenous, PRN, Schnier, Latina Craver, MD, 3 mL at 05/08/21 1118   sodium zirconium cyclosilicate (LOKELMA) packet 10 g, 10 g, Oral, Daily, Wouk, Wilfred Curtis, MD    ALLERGIES   Penicillins     REVIEW OF SYSTEMS    Review of Systems:  Gen:  Denies  fever, sweats, chills weigh loss  HEENT: Denies blurred vision, double vision, ear pain, eye pain, hearing loss, nose bleeds, sore throat Cardiac:  No dizziness, chest pain or heaviness, chest tightness,edema Resp:   Denies cough or sputum porduction, shortness of breath,wheezing, hemoptysis,  Gi: Denies swallowing difficulty, stomach pain, nausea or vomiting, diarrhea, constipation, bowel incontinence Gu:  Denies bladder incontinence, burning urine Ext:   Denies Joint pain, stiffness or  swelling Skin: Denies  skin rash, easy bruising or bleeding or hives Endoc:  Denies polyuria, polydipsia , polyphagia or weight change Psych:   Denies depression, insomnia or hallucinations   Other:  All other systems negative   VS: BP 118/79 (BP Location: Left Arm)   Pulse 99   Temp 99.3 F (37.4 C) (Oral)   Resp (!) 22   Ht 6\' 1"  (1.854 m)   Wt 72.4 kg   SpO2 95%   BMI 21.06 kg/m      PHYSICAL EXAM    GENERAL:NAD, no fevers, chills, no weakness no fatigue HEAD: Normocephalic, atraumatic.  EYES: Pupils equal, round, reactive to light. Extraocular muscles intact. No scleral icterus.  MOUTH: Moist  mucosal membrane. Dentition intact. No abscess noted.  EAR, NOSE, THROAT: Clear without exudates. No external lesions. +HFNC NECK: Supple. No thyromegaly. No nodules. No JVD.  PULMONARY: decreased air entry bilaterally worse at bases CARDIOVASCULAR: S1 and S2. Regular rate and rhythm. No murmurs, rubs, or gallops. No edema. Pedal pulses 2+ bilaterally.  GASTROINTESTINAL: Soft, nontender, nondistended. No masses. Positive bowel sounds. No hepatosplenomegaly.  MUSCULOSKELETAL: No swelling, clubbing, or edema. Range of motion full in all extremities.  NEUROLOGIC: Cranial nerves II through XII are intact. No gross focal neurological deficits. Sensation intact. Reflexes intact.  SKIN: No ulceration, lesions, rashes, or cyanosis. Skin warm and dry. Turgor intact.  PSYCHIATRIC: Mood, affect within normal limits. The patient is awake, alert and oriented x 3. Insight, judgment intact.       IMAGING    DG Chest 2 View  Result Date: 04/26/2021 CLINICAL DATA:  Chest congestion, wheezing, and low-grade fever for the past 5 days. EXAM: CHEST - 2 VIEW COMPARISON:  Chest x-ray dated November 11, 2019. FINDINGS: The heart size and mediastinal contours are within normal limits. Mild left lower lobe atelectasis. No focal consolidation, pleural effusion, or pneumothorax. No acute osseous abnormality.  IMPRESSION: 1. Mild left lower lobe atelectasis. Electronically Signed   By: Obie Dredge M.D.   On: 04/26/2021 14:56   CT Angio Chest Pulmonary Embolism (PE) W or WO Contrast  Result Date: 05/05/2021 CLINICAL DATA:  Evaluate for pulmonary embolus EXAM: CT ANGIOGRAPHY CHEST WITH CONTRAST TECHNIQUE: Multidetector CT imaging of the chest was performed using the standard protocol during bolus administration of intravenous contrast. Multiplanar CT image reconstructions and MIPs were obtained to evaluate the vascular anatomy. CONTRAST:  86mL OMNIPAQUE IOHEXOL 350 MG/ML SOLN COMPARISON:  None. FINDINGS: Cardiovascular: Adequate contrast opacification of the pulmonary arteries. Pulmonary embolus seen in the distal right main pulmonary artery, segmental right upper and lower lobe pulmonary arteries, and bilateral subsegmental pulmonary arteries. RV/LV ratio is greater than 1. Normal heart size. No pericardial effusion. No significant coronary artery calcifications. Atherosclerotic disease of the thoracic aorta. Mediastinum/Nodes: Small hiatal hernia. Thyroid is unremarkable. Prominent subcentimeter mediastinal lymph nodes which are likely reactive. Reference subcarinal lymph node measuring 8 mm in short axis on series 5, image 134. Lungs/Pleura: Central airways are patent. Bilateral patchy peripheral predominant ground-glass opacities. No pleural effusion or pneumothorax, Upper Abdomen: Low-density liver lesions, largest measuring up to 3.3 cm, which are likely simple cysts. No acute abnormality. Musculoskeletal: No chest wall abnormality. No acute or significant osseous findings. Review of the MIP images confirms the above findings. IMPRESSION: Pulmonary embolus seen in the distal right main pulmonary artery, segmental right upper and lower lobe pulmonary arteries, and bilateral subsegmental pulmonary arteries. RV/LV ratio is greater than 1, which is suggestive of heart right heart strain. Correlate with  echocardiography. Patchy, bilateral peripheral predominant ground-glass opacities, compatible with history of COVID-19 infection. Aortic Atherosclerosis (ICD10-I70.0). Critical Value/emergent results were called by telephone at the time of interpretation on 05/05/2021 at 2:38 pm to RN Jayme Cloud, who verbally acknowledged these results and will relay the results to the hospitalist on call. Electronically Signed   By: Allegra Lai M.D.   On: 05/05/2021 14:39   PERIPHERAL VASCULAR CATHETERIZATION  Result Date: May 30, 2021 See surgical note for result.  US Venous Img Lower Bilateral (DVT)  Result Date: May 30, 2021 CLINICAL DATA:  Bilateral lower extremity pain. Pulmonary embolism. History of COVID. Evaluate for lower extremity DVT. EXAM: BILATERAL LOWER EXTREMITY VENOUS DOPPLER ULTRASOUND TECHNIQUE: Gray-scale sonography with graded compression,  as well as color Doppler and duplex ultrasound were performed to evaluate the lower extremity deep venous systems from the level of the common femoral vein and including the common femoral, femoral, profunda femoral, popliteal and calf veins including the posterior tibial, peroneal and gastrocnemius veins when visible. The superficial great saphenous vein was also interrogated. Spectral Doppler was utilized to evaluate flow at rest and with distal augmentation maneuvers in the common femoral, femoral and popliteal veins. COMPARISON:  None. FINDINGS: RIGHT LOWER EXTREMITY Common Femoral Vein: No evidence of thrombus. Normal compressibility, respiratory phasicity and response to augmentation. Saphenofemoral Junction: No evidence of thrombus. Normal compressibility and flow on color Doppler imaging. Profunda Femoral Vein: No evidence of thrombus. Normal compressibility and flow on color Doppler imaging. Femoral Vein: No evidence of thrombus. Normal compressibility, respiratory phasicity and response to augmentation. Popliteal Vein: No evidence of thrombus. Normal  compressibility, respiratory phasicity and response to augmentation. Calf Veins: No evidence of thrombus. Normal compressibility and flow on color Doppler imaging. Superficial Great Saphenous Vein: No evidence of thrombus. Normal compressibility. Other Findings: Note is made of an approximately 3.0 x 2.8 x 0.6 cm minimally complex fluid collection with the right popliteal fossa compatible with a Baker's cyst. LEFT LOWER EXTREMITY Common Femoral Vein: No evidence of thrombus. Normal compressibility, respiratory phasicity and response to augmentation. Saphenofemoral Junction: No evidence of thrombus. Normal compressibility and flow on color Doppler imaging. Profunda Femoral Vein: No evidence of thrombus. Normal compressibility and flow on color Doppler imaging. Femoral Vein: No evidence of thrombus. Normal compressibility, respiratory phasicity and response to augmentation. Popliteal Vein: No evidence of thrombus. Normal compressibility, respiratory phasicity and response to augmentation. Calf Veins: The examination is positive for occlusive DVT involving both paired posterior tibial veins (images 60 and 61). The peroneal veins appear patent where imaged. Superficial Great Saphenous Vein: No evidence of thrombus. Normal compressibility. Other Findings:  None. IMPRESSION: 1. Examination is positive for occlusive DVT involving both paired left posterior tibial veins. There is no extension of this distal tibial DVT to the more proximal venous system of the left lower extremity. 2. No evidence of DVT within the right lower extremity. Electronically Signed   By: Simonne Come M.D.   On: 05/11/2021 07:39   DG Chest Port 1 View  Result Date: May 16, 2021 CLINICAL DATA:  Shortness of breath, weakness, COVID EXAM: PORTABLE CHEST 1 VIEW COMPARISON:  04/26/2021 FINDINGS: Patchy bilateral airspace disease, new since prior study compatible with multifocal pneumonia. Heart is normal size. No visible effusions or pneumothorax.  Aortic atherosclerosis. No acute bony abnormality. IMPRESSION: New extensive patchy bilateral airspace disease compatible with multifocal pneumonia. Electronically Signed   By: Charlett Nose M.D.   On: 05/16/2021 17:45   ECHOCARDIOGRAM COMPLETE  Result Date: 05/04/2021    ECHOCARDIOGRAM REPORT   Patient Name:   VANE LAKE Date of Exam: 05/04/2021 Medical Rec #:  [id number]        Height:       73.0 in Accession #:    [id number]       Weight:       162.3 lb Date of Birth:  08-26-1935        BSA:          1.969 m Patient Age:    85 years         BP:           128/88 mmHg Patient Gender: M  HR:           32 bpm. Exam Location:  [facility] Procedure: 2D Echo, Color Doppler and Cardiac Doppler Indications:     Acute respiratory distress R06.03  History:         Patient has no prior history of Echocardiogram examinations.                  Risk Factors:Hypertension.  Sonographer:     Cristela Blue Referring Phys:  [id number] DANA E GRAVES Diagnosing Phys: Cristal Deer End MD  Sonographer Comments: Suboptimal parasternal window and no subcostal window. IMPRESSIONS  1. Left ventricular ejection fraction, by estimation, is 55 to 60%. The left ventricle has normal function. Left ventricular endocardial border not optimally defined to evaluate regional wall motion. There is mild to moderate left ventricular hypertrophy. Left ventricular diastolic parameters are consistent with Grade I diastolic dysfunction (impaired relaxation).  2. Right ventricular systolic function is low normal. The right ventricular size is mildly enlarged.  3. The mitral valve is abnormal. Mild to moderate mitral valve regurgitation. No evidence of mitral stenosis.  4. The aortic valve was not well visualized. Aortic valve regurgitation is not visualized. No aortic stenosis is present. FINDINGS  Left Ventricle: Left ventricular ejection fraction, by estimation, is 55 to 60%. The left ventricle has normal function. Left ventricular endocardial  border not optimally defined to evaluate regional wall motion. The left ventricular internal cavity size was normal in size. There is mild to moderate left ventricular hypertrophy. Left ventricular diastolic parameters are consistent with Grade I diastolic dysfunction (impaired relaxation). Right Ventricle: The right ventricular size is mildly enlarged. Right vetricular wall thickness was not well visualized. Right ventricular systolic function is low normal. Left Atrium: Left atrial size was normal in size. Right Atrium: Right atrial size was normal in size. Pericardium: The pericardium was not well visualized. Mitral Valve: The mitral valve is abnormal. Mild to moderate mitral valve regurgitation. No evidence of mitral valve stenosis. Tricuspid Valve: The tricuspid valve is not well visualized. Tricuspid valve regurgitation is not demonstrated. Aortic Valve: The aortic valve was not well visualized. Aortic valve regurgitation is not visualized. No aortic stenosis is present. Aortic valve mean gradient measures 2.0 mmHg. Aortic valve peak gradient measures 3.4 mmHg. Aortic valve area, by VTI measures 3.19 cm. Pulmonic Valve: The pulmonic valve was not well visualized. Aorta: The aortic root is normal in size and structure. Pulmonary Artery: The pulmonary artery is not well seen. Venous: The inferior vena cava was not well visualized. IAS/Shunts: The interatrial septum was not well visualized.  LEFT VENTRICLE PLAX 2D LVIDd:         3.80 cm   Diastology LVIDs:         2.70 cm   LV e' medial:    5.76 cm/s LV PW:         1.30 cm   LV E/e' medial:  9.3 LV IVS:        1.20 cm   LV e' lateral:   7.40 cm/s LVOT diam:     2.00 cm   LV E/e' lateral: 7.2 LV SV:         58 LV SV Index:   30 LVOT Area:     3.14 cm  RIGHT VENTRICLE RV Basal diam:  3.50 cm RV S prime:     11.88 cm/s TAPSE (M-mode): 1.6 cm LEFT ATRIUM           Index  RIGHT ATRIUM           Index LA diam:      4.40 cm 2.24 cm/m   RA Area:     13.80 cm LA  Vol (A2C): 23.4 ml 11.89 ml/m  RA Volume:   29.90 ml  15.19 ml/m LA Vol (A4C): 35.7 ml 18.13 ml/m  AORTIC VALVE AV Area (Vmax):    3.13 cm AV Area (Vmean):   2.87 cm AV Area (VTI):     3.19 cm AV Vmax:           91.80 cm/s AV Vmean:          69.500 cm/s AV VTI:            0.183 m AV Peak Grad:      3.4 mmHg AV Mean Grad:      2.0 mmHg LVOT Vmax:         91.40 cm/s LVOT Vmean:        63.400 cm/s LVOT VTI:          0.186 m LVOT/AV VTI ratio: 1.02  AORTA Ao Root diam: 2.90 cm MITRAL VALVE               TRICUSPID VALVE MV Area (PHT): 6.02 cm    TR Peak grad:   16.2 mmHg MV Decel Time: 126 msec    TR Vmax:        201.00 cm/s MV E velocity: 53.60 cm/s MV A velocity: 94.70 cm/s  SHUNTS MV E/A ratio:  0.57        Systemic VTI:  0.19 m                            Systemic Diam: 2.00 cm Yvonne Kendall MD Electronically signed by Yvonne Kendall MD Signature Date/Time: 05/04/2021/6:49:13 PM    Final       ASSESSMENT/PLAN   Acute hypoxemic respiratory failure - present on admission  - COVID19 +  - supplemental O2 during my evaluation HFNC 80%/35L/min - CRP, fungitell, procalcitonin -will consider for bronchoscopy  -PT/OT  -please encourage patient to use incentive spirometer few times each hour while hospitalized.      Bibasilar atelectasis   - contributing to hypoxemia  - Unable to perform aggressive BPH due to COVID precautions  -patient encouraged to use IS at bedside   - need to start metaNEB as soon as covid precautions down   Pulmonary edema  Interval CXR with interstitial opacification bilaterally      - need to diurese despite AKI will do with albumin today   Acute Kidney injury -contributing to hypoxemia with pulmonary congestion -dc nonessential nephrotoxins -hypoalbuminemia contributing to third spaced fluid shift -electrolyte derrangements with uremia and hyperkalemia     Acute PE -S/p thrombectomry  Remains on HFNC with severe hypoxemia with any activity -on  eliquis     Thank you for allowing me to participate in the care of this patient.  Total face to face encounter time for this patient visit was >67min. >50% of the time was  spent in counseling and coordination of care.   Patient/Family are satisfied with care plan and all questions have been answered.  This document was prepared using Dragon voice recognition software and may include unintentional dictation errors.     Vida Rigger, M.D.  Division of Pulmonary & Critical Care Medicine  [facility]

## 2021-05-10 NOTE — Progress Notes (Signed)
PROGRESS NOTE    Jonathan Salinas  [id number]:[id number] DOB: 1935-12-17 DOA: 05-29-2021 PCP: [name], MD  Outpatient Specialists: none    Brief Narrative:   From admission h and p: This is an 85 yo male who presented to [facility] ER on 10/10 via EMS with c/o shortness of breath onset 09/29.Marland Kitchen  He was evaluated at [facility] on 10/3, and initially treated with doxycycline 100 mg bid for suspected bronchitis pending COVID-19 test results.  Pt is unvaccinated.  His COVID-19 results came back positive, however he was out of the window for antiviral treatment, and provider recommended supportive care.  On 10/10 pt reported each time he took the doxycycline as prescribed he vomited and he continued to have worsening shortness of breath, therefore EMS notified. EMS reported upon their arrival at pts home his O2 sats were 68% on RA.  EMS placed pt on NRB @15L , pt received  solumedrol and duonebs with O2 sats increasing to the 80's.   ED Course: Upon arrival to the ER pt remained hypoxic with O2 sats in the low to mid 80's requiring transition to 100% HHFNC @60  L/min.  CXR concerning for multifocal pneumonia.  Lab results revealed wbc 21.0 and fibrinogen 535.  Pt received cefepime and vancomycin.  PCCM team contacted for ICU admission.        Assessment & Plan:   Active Problems:   Pneumonia due to COVID-19 virus   Pulmonary embolism (HCC)   DVT (deep venous thrombosis) (HCC)  # Hyperkalemia 5.7 today. No clear med triggers. High normal k yesterday of 5.1 - start fluids - start kayexalate - repeat labs - check ekg - insulin/glucose  # Acute hypoxic respiratory failure # Covid pneumonia O2 80s on arrival, initially requiring 100% hfnc @ 60.  Symptoms began 9/29. Treated w/ doxycycline as outpatient. Positive test 10/3. PE contributing. Still requiring high flow nasal cannula, increased o2 need since yesterday. No chest pain - methylpred started 10/10, weaned down, transitioned to  oral dexamethasone 10/17 - started baricitinib (start 10/12), plan for up to 14 days - will need up to 20 day quarantine - checking repeat cxr, also bnp and trop this pm given worsening oxygenation.  # Pulmonary embolism # DVT Significant right-sided PE. Evidence right heart strain; TTE shows low-normal RV function. B/l distal tibial dvt on lower extremity PVLs. Thrombectomy/thrombolysis procedure with vascular on 10/14.  - transitioned to eliquis on 10/15  # HTN Here bp moderately elevated. K increased to 5.1 today - substitute amlodipine for home lisinopril  # Transaminitis Mild. resolved - monitor for now   DVT prophylaxis: eliquis Code Status: full Family Communication: son 11/14 updated telephonically 10/16  Level of care: progressive Status is: Inpatient  Remains inpatient appropriate because:Inpatient level of care appropriate due to severity of illness  Dispo: The patient is from: Home              Anticipated d/c is to: Home              Patient currently is not medically stable to d/c.   Difficult to place patient No     Consultants:  none  Procedures: none  Antimicrobials:  S/p cefepime    Subjective: This morning says breathing stable, no chest pain or palpitations  Objective: Vitals:   05/10/21 0500 05/10/21 0520 05/10/21 0756 05/10/21 1207  BP:   134/86 118/79  Pulse:   85 99  Resp:   (!) 21 (!) 22  Temp:  99.1 F (37.3 C) 99.3 F (37.4 C)  TempSrc:   Oral Oral  SpO2: 95% 96% 96% 95%  Weight:      Height:        Intake/Output Summary (Last 24 hours) at 05/10/2021 1313 Last data filed at 05/10/2021 1208 Gross per 24 hour  Intake 360 ml  Output 1800 ml  Net -1440 ml   Filed Weights   05/21/2021 0440 05/08/21 0204 05/09/21 0455  Weight: 75.4 kg 74.3 kg 72.4 kg    Examination:  General exam: Appears calm and comfortable. Hard of hearing. Respiratory system: scattered rhonchi and rales, tachypnic Cardiovascular system: S1 & S2  heard, mild tachycardia. No JVD, murmurs, rubs, gallops or clicks. No pedal edema. Gastrointestinal system: Abdomen is nondistended, soft and nontender. No organomegaly or masses felt. Normal bowel sounds heard. Central nervous system: Alert and oriented. No focal neurological deficits. Extremities: Symmetric 5 x 5 power. Skin: No rashes, lesions or ulcers Psychiatry: Judgement and insight appear normal. Mood & affect appropriate.     Data Reviewed: I have personally reviewed following labs and imaging studies  CBC: Recent Labs  Lab 05/04/21 0605 05/05/21 0054 05/06/21 0651 May 21, 2021 0611 05/08/21 0524 05/09/21 0456 05/10/21 0417  WBC 14.8* 19.0* 15.6* 21.9* 20.3* 20.2* 22.6*  NEUTROABS 13.4* 17.1* 14.1* 20.1* 18.2*  --   --   HGB 12.2* 11.0* 11.6* 12.1* 11.2* 12.5* 13.9  HCT 34.0* 31.5* 34.1* 36.1* 32.9* 35.9* 41.3  MCV 90.2 92.1 91.9 94.3 94.0 94.0 93.9  PLT 254 278 320 353 347 356 372   Basic Metabolic Panel: Recent Labs  Lab 05/09/2021 1746 05/04/21 0605 05/05/21 0054 05/06/21 0651 05/21/2021 0611 05/08/21 0524 05/09/21 0456 05/10/21 0417  NA  --  136 134* 136 135 135 132* 130*  K  --  3.8 3.4* 4.4 4.5 4.7 5.1 5.6*  CL  --  103 98 100 103 102 101 97*  CO2  --  23 26 27 25 27 24 27   GLUCOSE  --  129* 133* 154* 160* 150* 163* 146*  BUN  --  35* 32* 27* 38* 31* 34* 44*  CREATININE 1.52* 1.27* 1.06 1.19 1.30* 1.15 1.25* 1.26*  CALCIUM  --  8.0* 7.0* 7.8* 7.9* 7.9* 8.3* 8.4*  MG 2.2 2.2 2.2 2.5* 2.5* 2.3 2.4  --   PHOS 4.1 4.0 2.9  --   --   --  4.1  --    GFR: Estimated Creatinine Clearance: 45.5 mL/min (A) (by C-G formula based on SCr of 1.26 mg/dL (H)). Liver Function Tests: Recent Labs  Lab 05/04/21 0605 05/05/21 0054 05/06/21 0651 2021-05-21 0611 05/08/21 0524  AST 27 42* 28 87* 33  ALT 23 26 24  61* 44  ALKPHOS 57 49 50 64 58  BILITOT 1.0 0.7 0.7 0.6 0.8  PROT 6.2* 5.4* 5.9* 6.2* 5.9*  ALBUMIN 1.9* 1.7* 1.9* 2.0* 2.0*   No results for input(s): LIPASE,  AMYLASE in the last 168 hours. No results for input(s): AMMONIA in the last 168 hours. Coagulation Profile: Recent Labs  Lab 05/02/2021 1940 05/09/21 0456  INR 1.5* 1.6*   Cardiac Enzymes: No results for input(s): CKTOTAL, CKMB, CKMBINDEX, TROPONINI in the last 168 hours. BNP (last 3 results) No results for input(s): PROBNP in the last 8760 hours. HbA1C: No results for input(s): HGBA1C in the last 72 hours.  CBG: Recent Labs  Lab 05/05/21 0751 05/09/21 1606 05/09/21 2015 05/10/21 0756 05/10/21 1206  GLUCAP 122* 125* 164* 133* 158*   Lipid  Profile: No results for input(s): CHOL, HDL, LDLCALC, TRIG, CHOLHDL, LDLDIRECT in the last 72 hours.  Thyroid Function Tests: No results for input(s): TSH, T4TOTAL, FREET4, T3FREE, THYROIDAB in the last 72 hours.  Anemia Panel: No results for input(s): VITAMINB12, FOLATE, FERRITIN, TIBC, IRON, RETICCTPCT in the last 72 hours.  Urine analysis:    Component Value Date/Time   BILIRUBINUR neg 11/20/2015 1511   PROTEINUR neg 11/20/2015 1511   UROBILINOGEN 0.2 11/20/2015 1511   NITRITE neg 11/20/2015 1511   LEUKOCYTESUR Negative 11/20/2015 1511   Sepsis Labs: @LABRCNTIP (procalcitonin:4,lacticidven:4)  ) Recent Results (from the past 240 hour(s))  Blood Culture (routine x 2)     Status: None   Collection Time: 05/23/2021  5:33 PM   Specimen: BLOOD  Result Value Ref Range Status   Specimen Description BLOOD BLOOD LEFT FOREARM  Final   Special Requests   Final    BOTTLES DRAWN AEROBIC AND ANAEROBIC Blood Culture adequate volume   Culture   Final    NO GROWTH 5 DAYS Performed at [facility], [address], [place], [place]    Report Status 05/08/2021 FINAL  Final  Blood Culture (routine x 2)     Status: None   Collection Time: 04/30/2021  5:40 PM   Specimen: BLOOD  Result Value Ref Range Status   Specimen Description BLOOD BLOOD RIGHT FOREARM  Final   Special Requests   Final    BOTTLES DRAWN AEROBIC AND  ANAEROBIC Blood Culture adequate volume   Culture   Final    NO GROWTH 5 DAYS Performed at [facility], [address], [place], [place]    Report Status 05/08/2021 FINAL  Final  MRSA Next Gen by PCR, Nasal     Status: None   Collection Time: 04/27/2021  7:07 PM   Specimen: Nasal Mucosa; Nasal Swab  Result Value Ref Range Status   MRSA by PCR Next Gen NOT DETECTED NOT DETECTED Final    Comment: (NOTE) The GeneXpert MRSA Assay (FDA approved for NASAL specimens only), is one component of a comprehensive MRSA colonization surveillance program. It is not intended to diagnose MRSA infection nor to guide or monitor treatment for MRSA infections. Test performance is not FDA approved in patients less than 37 years old. Performed at [facility], [address], [place], [place]          Radiology Studies: No results found.      Scheduled Meds:  amLODipine  10 mg Oral Daily   apixaban  10 mg Oral BID   Followed by   79024 ON 05/15/2021] apixaban  5 mg Oral BID   baricitinib  2 mg Oral Daily   Chlorhexidine Gluconate Cloth  6 each Topical Q0600   dexamethasone  6 mg Oral Daily   insulin aspart  10 Units Intravenous Once   And   dextrose  1 ampule Intravenous Once   feeding supplement  237 mL Oral TID BM   Gerhardt's butt cream   Topical BID   mouth rinse  15 mL Mouth Rinse BID   multivitamin with minerals  1 tablet Oral Daily   sodium chloride flush  3 mL Intravenous Q12H   sodium zirconium cyclosilicate  10 g Oral Daily   Continuous Infusions:  sodium chloride     sodium chloride       LOS: 7 days    Time spent: 25 min    05/17/2021, MD [facility]  If 7PM-7AM, please contact night-coverage [email or web address] Password [email or web address] 05/10/2021, 1:13 PM

## 2021-05-10 NOTE — Discharge Instructions (Signed)
Vascular Surgery Discharge Instructions:  1) You may shower. Please keep your groins clean and dry. Gently clean with soap and water. Gently pat dry. 2) Please do not engage in strenuous activity or lifting greater than 10 pounds until you are cleared at one of your post procedure follow-ups.

## 2021-05-11 ENCOUNTER — Inpatient Hospital Stay: Payer: Medicare Other

## 2021-05-11 DIAGNOSIS — U071 COVID-19: Secondary | ICD-10-CM | POA: Diagnosis not present

## 2021-05-11 DIAGNOSIS — J9601 Acute respiratory failure with hypoxia: Secondary | ICD-10-CM | POA: Diagnosis not present

## 2021-05-11 DIAGNOSIS — I2699 Other pulmonary embolism without acute cor pulmonale: Secondary | ICD-10-CM | POA: Diagnosis not present

## 2021-05-11 LAB — COMPREHENSIVE METABOLIC PANEL
ALT: 72 U/L — ABNORMAL HIGH (ref 0–44)
AST: 43 U/L — ABNORMAL HIGH (ref 15–41)
Albumin: 2.4 g/dL — ABNORMAL LOW (ref 3.5–5.0)
Alkaline Phosphatase: 66 U/L (ref 38–126)
Anion gap: 5 (ref 5–15)
BUN: 51 mg/dL — ABNORMAL HIGH (ref 8–23)
CO2: 25 mmol/L (ref 22–32)
Calcium: 8.3 mg/dL — ABNORMAL LOW (ref 8.9–10.3)
Chloride: 100 mmol/L (ref 98–111)
Creatinine, Ser: 1.37 mg/dL — ABNORMAL HIGH (ref 0.61–1.24)
GFR, Estimated: 51 mL/min — ABNORMAL LOW (ref >60)
Glucose, Bld: 124 mg/dL — ABNORMAL HIGH (ref 70–99)
Potassium: 5.6 mmol/L — ABNORMAL HIGH (ref 3.5–5.1)
Sodium: 130 mmol/L — ABNORMAL LOW (ref 135–145)
Total Bilirubin: 0.8 mg/dL (ref 0.3–1.2)
Total Protein: 7.3 g/dL (ref 6.5–8.1)

## 2021-05-11 LAB — MAGNESIUM: Magnesium: 2.5 mg/dL — ABNORMAL HIGH (ref 1.7–2.4)

## 2021-05-11 LAB — BASIC METABOLIC PANEL
Anion gap: 15 (ref 5–15)
BUN: 52 mg/dL — ABNORMAL HIGH (ref 8–23)
CO2: 21 mmol/L — ABNORMAL LOW (ref 22–32)
Calcium: 8.7 mg/dL — ABNORMAL LOW (ref 8.9–10.3)
Chloride: 96 mmol/L — ABNORMAL LOW (ref 98–111)
Creatinine, Ser: 1.52 mg/dL — ABNORMAL HIGH (ref 0.61–1.24)
GFR, Estimated: 45 mL/min — ABNORMAL LOW (ref >60)
Glucose, Bld: 49 mg/dL — ABNORMAL LOW (ref 70–99)
Potassium: 4.5 mmol/L (ref 3.5–5.1)
Sodium: 132 mmol/L — ABNORMAL LOW (ref 135–145)

## 2021-05-11 LAB — TROPONIN I (HIGH SENSITIVITY): Troponin I (High Sensitivity): 150 ng/L (ref <18)

## 2021-05-11 LAB — C-REACTIVE PROTEIN: CRP: 4.9 mg/dL — ABNORMAL HIGH (ref <1.0)

## 2021-05-11 LAB — CBC
HCT: 43.2 % (ref 39.0–52.0)
Hemoglobin: 14.3 g/dL (ref 13.0–17.0)
MCH: 31.4 pg (ref 26.0–34.0)
MCHC: 33.1 g/dL (ref 30.0–36.0)
MCV: 94.7 fL (ref 80.0–100.0)
Platelets: 385 10*3/uL (ref 150–400)
RBC: 4.56 MIL/uL (ref 4.22–5.81)
RDW: 13.6 % (ref 11.5–15.5)
WBC: 21 10*3/uL — ABNORMAL HIGH (ref 4.0–10.5)
nRBC: 0 % (ref 0.0–0.2)

## 2021-05-11 LAB — PHOSPHORUS: Phosphorus: 5.3 mg/dL — ABNORMAL HIGH (ref 2.5–4.6)

## 2021-05-11 LAB — D-DIMER, QUANTITATIVE: D-Dimer, Quant: 3.33 ug/mL-FEU — ABNORMAL HIGH (ref 0.00–0.50)

## 2021-05-11 LAB — PROCALCITONIN: Procalcitonin: 0.12 ng/mL

## 2021-05-11 LAB — BRAIN NATRIURETIC PEPTIDE: B Natriuretic Peptide: 40.8 pg/mL (ref 0.0–100.0)

## 2021-05-11 LAB — GLUCOSE, CAPILLARY: Glucose-Capillary: 92 mg/dL (ref 70–99)

## 2021-05-11 MED ORDER — FENTANYL 2500MCG IN NS 250ML (10MCG/ML) PREMIX INFUSION
0.0000 ug/h | INTRAVENOUS | Status: DC
  Administered 2021-05-12 – 2021-05-15 (×5): 125 ug/h via INTRAVENOUS
  Administered 2021-05-16 – 2021-05-18 (×6): 200 ug/h via INTRAVENOUS
  Administered 2021-05-19: 175 ug/h via INTRAVENOUS
  Administered 2021-05-19 – 2021-05-20 (×3): 200 ug/h via INTRAVENOUS
  Administered 2021-05-21 – 2021-05-22 (×2): 150 ug/h via INTRAVENOUS
  Administered 2021-05-23 (×2): 200 ug/h via INTRAVENOUS
  Filled 2021-05-11 (×21): qty 250

## 2021-05-11 MED ORDER — FENTANYL CITRATE PF 50 MCG/ML IJ SOSY
100.0000 ug | PREFILLED_SYRINGE | Freq: Once | INTRAMUSCULAR | Status: AC
Start: 2021-05-11 — End: 2021-05-11

## 2021-05-11 MED ORDER — METHYLPREDNISOLONE SODIUM SUCC 125 MG IJ SOLR: 125.0000 mg | Freq: Two times a day (BID) | INTRAMUSCULAR | Status: DC

## 2021-05-11 MED ORDER — DEXTROSE 50 % IV SOLN
1.0000 | Freq: Once | INTRAVENOUS | Status: AC
  Administered 2021-05-11: 50 mL via INTRAVENOUS
  Filled 2021-05-11: qty 50

## 2021-05-11 MED ORDER — LABETALOL HCL 5 MG/ML IV SOLN
10.0000 mg | INTRAVENOUS | Status: DC | PRN
  Administered 2021-05-11: 10 mg via INTRAVENOUS
  Filled 2021-05-11: qty 4

## 2021-05-11 MED ORDER — FENTANYL CITRATE (PF) 100 MCG/2ML IJ SOLN
INTRAMUSCULAR | Status: AC
  Administered 2021-05-11: 100 ug
  Filled 2021-05-11: qty 2

## 2021-05-11 MED ORDER — MIDAZOLAM HCL 2 MG/2ML IJ SOLN
1.0000 mg | INTRAMUSCULAR | Status: AC | PRN
  Administered 2021-05-11 – 2021-05-14 (×3): 1 mg via INTRAVENOUS
  Filled 2021-05-11: qty 2

## 2021-05-11 MED ORDER — VECURONIUM BROMIDE 10 MG IV SOLR
10.0000 mg | INTRAVENOUS | Status: DC | PRN
  Administered 2021-05-11: 10 mg via INTRAVENOUS

## 2021-05-11 MED ORDER — FENTANYL 2500MCG IN NS 250ML (10MCG/ML) PREMIX INFUSION
INTRAVENOUS | Status: AC
  Administered 2021-05-11: 50 ug/h
  Filled 2021-05-11: qty 250

## 2021-05-11 MED ORDER — ALBUMIN HUMAN 25 % IV SOLN
12.5000 g | Freq: Every day | INTRAVENOUS | Status: DC
  Administered 2021-05-11 – 2021-05-12 (×2): 12.5 g via INTRAVENOUS
  Filled 2021-05-11 (×2): qty 50

## 2021-05-11 MED ORDER — METHYLPREDNISOLONE SODIUM SUCC 40 MG IJ SOLR
40.0000 mg | Freq: Two times a day (BID) | INTRAMUSCULAR | Status: DC
  Administered 2021-05-11: 40 mg via INTRAVENOUS
  Filled 2021-05-11: qty 1

## 2021-05-11 MED ORDER — HEPARIN (PORCINE) 25000 UT/250ML-% IV SOLN
1350.0000 [IU]/h | INTRAVENOUS | Status: DC
  Administered 2021-05-11: 1200 [IU]/h via INTRAVENOUS
  Administered 2021-05-12 – 2021-05-13 (×2): 1350 [IU]/h via INTRAVENOUS
  Administered 2021-05-13: 1250 [IU]/h via INTRAVENOUS
  Filled 2021-05-11 (×4): qty 250

## 2021-05-11 MED ORDER — ETOMIDATE 2 MG/ML IV SOLN: 20.0000 mg | Freq: Once | INTRAVENOUS | Status: AC

## 2021-05-11 MED ORDER — NOREPINEPHRINE 4 MG/250ML-% IV SOLN
0.0000 ug/min | INTRAVENOUS | Status: DC
  Administered 2021-05-11: 2 ug/min via INTRAVENOUS
  Administered 2021-05-12: 4 ug/min via INTRAVENOUS
  Filled 2021-05-11 (×3): qty 250

## 2021-05-11 MED ORDER — FUROSEMIDE 10 MG/ML IJ SOLN
20.0000 mg | Freq: Two times a day (BID) | INTRAMUSCULAR | Status: DC
  Administered 2021-05-11 – 2021-05-12 (×3): 20 mg via INTRAVENOUS
  Filled 2021-05-11 (×3): qty 2

## 2021-05-11 MED ORDER — ROCURONIUM BROMIDE 50 MG/5ML IV SOLN
50.0000 mg | Freq: Once | INTRAVENOUS | Status: AC
  Filled 2021-05-11: qty 5

## 2021-05-11 MED ORDER — MAGNESIUM SULFATE 2 GM/50ML IV SOLN: 2.0000 g | Freq: Once | INTRAVENOUS | Status: DC

## 2021-05-11 MED ORDER — VECURONIUM BROMIDE 10 MG IV SOLR
INTRAVENOUS | Status: AC
  Filled 2021-05-11: qty 10

## 2021-05-11 MED ORDER — ETOMIDATE 2 MG/ML IV SOLN
INTRAVENOUS | Status: AC
  Administered 2021-05-11: 20 mg via INTRAVENOUS
  Filled 2021-05-11: qty 20

## 2021-05-11 MED ORDER — PROPOFOL 1000 MG/100ML IV EMUL
INTRAVENOUS | Status: AC
  Administered 2021-05-11: 10 ug/kg/min
  Filled 2021-05-11: qty 100

## 2021-05-11 MED ORDER — PATIROMER SORBITEX CALCIUM 8.4 G PO PACK
8.4000 g | PACK | Freq: Every day | ORAL | Status: DC
  Administered 2021-05-11 – 2021-05-12 (×2): 8.4 g via ORAL
  Filled 2021-05-11 (×4): qty 1

## 2021-05-11 MED ORDER — INSULIN ASPART 100 UNIT/ML IV SOLN
10.0000 [IU] | Freq: Once | INTRAVENOUS | Status: AC
  Administered 2021-05-11: 10 [IU] via INTRAVENOUS
  Filled 2021-05-11: qty 0.1

## 2021-05-11 MED ORDER — FAMOTIDINE IN NACL 20-0.9 MG/50ML-% IV SOLN
20.0000 mg | Freq: Two times a day (BID) | INTRAVENOUS | Status: DC
  Administered 2021-05-11 – 2021-05-12 (×2): 20 mg via INTRAVENOUS
  Filled 2021-05-11 (×2): qty 50

## 2021-05-11 MED ORDER — SODIUM CHLORIDE 0.9 % IV SOLN
3.0000 g | Freq: Four times a day (QID) | INTRAVENOUS | Status: DC
  Administered 2021-05-11 – 2021-05-12 (×4): 3 g via INTRAVENOUS
  Filled 2021-05-11 (×2): qty 8
  Filled 2021-05-11: qty 3
  Filled 2021-05-11 (×3): qty 8

## 2021-05-11 MED ORDER — MIDODRINE HCL 5 MG PO TABS
5.0000 mg | ORAL_TABLET | Freq: Three times a day (TID) | ORAL | Status: DC
  Administered 2021-05-11 – 2021-05-12 (×4): 5 mg via ORAL
  Filled 2021-05-11 (×4): qty 1

## 2021-05-11 MED ORDER — DOCUSATE SODIUM 50 MG/5ML PO LIQD
100.0000 mg | Freq: Two times a day (BID) | ORAL | Status: DC
  Administered 2021-05-11 – 2021-05-22 (×19): 100 mg
  Filled 2021-05-11 (×19): qty 10

## 2021-05-11 MED ORDER — FENTANYL BOLUS VIA INFUSION
25.0000 ug | INTRAVENOUS | Status: DC | PRN
  Administered 2021-05-11: 100 ug via INTRAVENOUS
  Administered 2021-05-14: 50 ug via INTRAVENOUS
  Administered 2021-05-14: 100 ug via INTRAVENOUS
  Administered 2021-05-14: 50 ug via INTRAVENOUS
  Administered 2021-05-16 – 2021-05-17 (×4): 100 ug via INTRAVENOUS
  Administered 2021-05-17: 50 ug via INTRAVENOUS
  Administered 2021-05-21 – 2021-05-22 (×3): 100 ug via INTRAVENOUS
  Administered 2021-05-24: 50 ug via INTRAVENOUS
  Administered 2021-05-24 (×2): 25 ug via INTRAVENOUS
  Filled 2021-05-11: qty 100

## 2021-05-11 MED ORDER — POLYETHYLENE GLYCOL 3350 17 G PO PACK
17.0000 g | PACK | Freq: Every day | ORAL | Status: DC
Start: 2021-05-11 — End: 2021-05-24
  Administered 2021-05-12 – 2021-05-22 (×7): 17 g
  Filled 2021-05-11 (×7): qty 1

## 2021-05-11 MED ORDER — CHLORHEXIDINE GLUCONATE 0.12% ORAL RINSE (MEDLINE KIT)
15.0000 mL | Freq: Two times a day (BID) | OROMUCOSAL | Status: DC
  Administered 2021-05-11 – 2021-05-24 (×26): 15 mL via OROMUCOSAL

## 2021-05-11 MED ORDER — MIDAZOLAM HCL 2 MG/2ML IJ SOLN
1.0000 mg | INTRAMUSCULAR | Status: DC | PRN
  Filled 2021-05-11: qty 2

## 2021-05-11 MED ORDER — ROCURONIUM BROMIDE 10 MG/ML (PF) SYRINGE
PREFILLED_SYRINGE | INTRAVENOUS | Status: AC
  Administered 2021-05-11: 50 mg via INTRAVENOUS
  Filled 2021-05-11: qty 10

## 2021-05-11 MED ORDER — ORAL CARE MOUTH RINSE
15.0000 mL | OROMUCOSAL | Status: DC
  Administered 2021-05-11 – 2021-05-24 (×128): 15 mL via OROMUCOSAL

## 2021-05-11 MED ORDER — PROPOFOL 1000 MG/100ML IV EMUL
0.0000 ug/kg/min | INTRAVENOUS | Status: DC
Start: 2021-05-11 — End: 2021-05-19
  Administered 2021-05-11: 15 ug/kg/min via INTRAVENOUS
  Administered 2021-05-12 (×2): 25 ug/kg/min via INTRAVENOUS
  Administered 2021-05-13 (×4): 30 ug/kg/min via INTRAVENOUS
  Administered 2021-05-14 (×2): 50 ug/kg/min via INTRAVENOUS
  Administered 2021-05-14: 40 ug/kg/min via INTRAVENOUS
  Administered 2021-05-14 – 2021-05-15 (×2): 30 ug/kg/min via INTRAVENOUS
  Administered 2021-05-15: 35 ug/kg/min via INTRAVENOUS
  Administered 2021-05-15 – 2021-05-17 (×7): 30 ug/kg/min via INTRAVENOUS
  Administered 2021-05-17: 40 ug/kg/min via INTRAVENOUS
  Administered 2021-05-17: 30 ug/kg/min via INTRAVENOUS
  Administered 2021-05-18: 40 ug/kg/min via INTRAVENOUS
  Administered 2021-05-18: 35 ug/kg/min via INTRAVENOUS
  Administered 2021-05-18: 40 ug/kg/min via INTRAVENOUS
  Administered 2021-05-18: 50 ug/kg/min via INTRAVENOUS
  Administered 2021-05-18: 30 ug/kg/min via INTRAVENOUS
  Administered 2021-05-19: 50 ug/kg/min via INTRAVENOUS
  Administered 2021-05-19: 40 ug/kg/min via INTRAVENOUS
  Filled 2021-05-11 (×4): qty 100
  Filled 2021-05-11: qty 200
  Filled 2021-05-11 (×23): qty 100

## 2021-05-11 MED ORDER — INSULIN ASPART 100 UNIT/ML IV SOLN
10.0000 [IU] | Freq: Once | INTRAVENOUS | Status: AC
  Administered 2021-05-11: 10 [IU] via INTRAVENOUS
  Filled 2021-05-11 (×2): qty 0.1

## 2021-05-11 MED ORDER — METHYLPREDNISOLONE SODIUM SUCC 40 MG IJ SOLR
40.0000 mg | Freq: Two times a day (BID) | INTRAMUSCULAR | Status: DC
  Administered 2021-05-12: 40 mg via INTRAVENOUS
  Filled 2021-05-11: qty 1

## 2021-05-11 MED ORDER — SODIUM CHLORIDE 1 G PO TABS
2.0000 g | ORAL_TABLET | Freq: Three times a day (TID) | ORAL | Status: DC
  Administered 2021-05-11 – 2021-05-12 (×3): 2 g via ORAL
  Filled 2021-05-11 (×5): qty 2

## 2021-05-11 NOTE — Progress Notes (Signed)
GOALS OF CARE DISCUSSION  The Clinical status was relayed to family in detail. Son at bedside Updated and notified of patients medical condition.    Patient remains unresponsive and will not open eyes to command.   Patient is having a weak cough and struggling to remove secretions.   Patient with increased WOB and using accessory muscles to breathe Explained to family course of therapy and the modalities    Patient with Progressive multiorgan failure with a very high probablity of a very minimal chance of meaningful recovery despite all aggressive and optimal medical therapy.  PATIENT REMAINS FULL CODE  Family understands the situation.    Family are satisfied with Plan of action and management. All questions answered  Additional CC time 35 mins   Jonathan Salinas Santiago Glad, M.D.  [facility]  Medical Director [facility] Medical Director [facility] Cardio-Pulmonary Department

## 2021-05-11 NOTE — Progress Notes (Signed)
PROGRESS NOTE    Jonathan Salinas  IDP:[id number] DOB: February 03, 1936 DOA: 05/19/2021 PCP: [name], MD  Outpatient Specialists: none    Brief Narrative:   From admission h and p: This is an 85 yo male who presented to [facility] ER on 10/10 via EMS with c/o shortness of breath onset 09/29.Marland Kitchen  He was evaluated at [facility] on 10/3, and initially treated with doxycycline 100 mg bid for suspected bronchitis pending COVID-19 test results.  Pt is unvaccinated.  His COVID-19 results came back positive, however he was out of the window for antiviral treatment, and provider recommended supportive care.  On 10/10 pt reported each time he took the doxycycline as prescribed he vomited and he continued to have worsening shortness of breath, therefore EMS notified. EMS reported upon their arrival at pts home his O2 sats were 68% on RA.  EMS placed pt on NRB @15L , pt received  solumedrol and duonebs with O2 sats increasing to the 80's.   ED Course: Upon arrival to the ER pt remained hypoxic with O2 sats in the low to mid 80's requiring transition to 100% HHFNC @60  L/min.  CXR concerning for multifocal pneumonia.  Lab results revealed wbc 21.0 and fibrinogen 535.  Pt received cefepime and vancomycin.  PCCM team contacted for ICU admission.        Assessment & Plan:   Active Problems:   Pneumonia due to COVID-19 virus   Pulmonary embolism (HCC)   DVT (deep venous thrombosis) (HCC)  # Hyperkalemia 5.7 today yesterday, resolved on repeat. 5.6 again today - ekg - insulin/dextrose - repeat bmp pending  # Acute hypoxic respiratory failure # Covid pneumonia O2 80s on presentation, initially requiring 100% hfnc @ 60.  Symptoms began 9/29. Treated w/ doxycycline as outpatient. Positive test 10/3. PE contributing. Worsening O2 requirement last 2 days. Rapid response this morning for tachypnea - transferred to ICU, ICU team will intubate - labs, imaging ordered  # Pulmonary embolism #  DVT Significant right-sided PE. Evidence right heart strain; TTE shows low-normal RV function. B/l distal tibial dvt on lower extremity PVLs. Thrombectomy/thrombolysis procedure with vascular on 10/14.  - stop eliquis, start IV heparin  # HTN - substituted amlodipine for home lisinopril  # Transaminitis Mild. resolved - monitor for now   DVT prophylaxis: eliquis>heparin Code Status: full Family Communication: message left w/ son informing of today's changes  Level of care: icu Status is: Inpatient  Remains inpatient appropriate because:Inpatient level of care appropriate due to severity of illness  Dispo: The patient is from: Home              Anticipated d/c is to: tbd              Patient currently is not medically stable to d/c.   Difficult to place patient No     Consultants:  none  Procedures: none  Antimicrobials:  S/p cefepime    Subjective: This morning says breathing feels worse, no chest pain  Objective: Vitals:   05/10/21 2343 05/11/21 0515 05/11/21 0700 05/11/21 1209  BP: 112/68 122/67 (!) 120/54   Pulse: 87 95 95 (!) 124  Resp: 19 20 18  (!) 39  Temp: 98.5 F (36.9 C) 97.7 F (36.5 C) 97.7 F (36.5 C)   TempSrc: Oral Oral Oral   SpO2: 96% 95% 93% 93%  Weight:      Height:        Intake/Output Summary (Last 24 hours) at 05/11/2021 1223 Last  data filed at 05/11/2021 0500 Gross per 24 hour  Intake 480 ml  Output 1850 ml  Net -1370 ml   Filed Weights   05/10/2021 0440 05/08/21 0204 05/09/21 0455  Weight: 75.4 kg 74.3 kg 72.4 kg    Examination:  General exam: tachypnic Respiratory system: tachypnic, scattered rales/rhonchi Cardiovascular system: S1 & S2 heard, mild tachycardia. No JVD, murmurs, rubs, gallops or clicks. No pedal edema. Gastrointestinal system: Abdomen is nondistended, soft and nontender. No organomegaly or masses felt. Normal bowel sounds heard. Central nervous system: Alert and oriented. No focal neurological  deficits. Extremities: Symmetric 5 x 5 power. Skin: No rashes, lesions or ulcers Psychiatry: Judgement and insight appear normal. Mood & affect appropriate.     Data Reviewed: I have personally reviewed following labs and imaging studies  CBC: Recent Labs  Lab 05/05/21 0054 05/06/21 0651 05/06/2021 0611 05/08/21 0524 05/09/21 0456 05/10/21 0417 05/11/21 0520  WBC 19.0* 15.6* 21.9* 20.3* 20.2* 22.6* 21.0*  NEUTROABS 17.1* 14.1* 20.1* 18.2*  --   --   --   HGB 11.0* 11.6* 12.1* 11.2* 12.5* 13.9 14.3  HCT 31.5* 34.1* 36.1* 32.9* 35.9* 41.3 43.2  MCV 92.1 91.9 94.3 94.0 94.0 93.9 94.7  PLT 278 320 353 347 356 372 385   Basic Metabolic Panel: Recent Labs  Lab 05/05/21 0054 05/06/21 0651 05/09/2021 0611 05/08/21 0524 05/09/21 0456 05/10/21 0417 05/10/21 1312 05/11/21 0520 05/11/21 0747  NA 134* 136 135 135 132* 130* 130* 130*  --   K 3.4* 4.4 4.5 4.7 5.1 5.6* 4.9 5.6*  --   CL 98 100 103 102 101 97* 97* 100  --   CO2 26 27 25 27 24 27 23 25   --   GLUCOSE 133* 154* 160* 150* 163* 146* 131* 124*  --   BUN 32* 27* 38* 31* 34* 44* 39* 51*  --   CREATININE 1.06 1.19 1.30* 1.15 1.25* 1.26* 1.37* 1.37*  --   CALCIUM 7.0* 7.8* 7.9* 7.9* 8.3* 8.4* 8.4* 8.3*  --   MG 2.2 2.5* 2.5* 2.3 2.4  --   --   --  2.5*  PHOS 2.9  --   --   --  4.1  --   --   --  5.3*   GFR: Estimated Creatinine Clearance: 41.8 mL/min (A) (by C-G formula based on SCr of 1.37 mg/dL (H)). Liver Function Tests: Recent Labs  Lab 05/05/21 0054 05/06/21 0651 05/12/2021 0611 05/08/21 0524 05/11/21 0520  AST 42* 28 87* 33 43*  ALT 26 24 61* 44 72*  ALKPHOS 49 50 64 58 66  BILITOT 0.7 0.7 0.6 0.8 0.8  PROT 5.4* 5.9* 6.2* 5.9* 7.3  ALBUMIN 1.7* 1.9* 2.0* 2.0* 2.4*   No results for input(s): LIPASE, AMYLASE in the last 168 hours. No results for input(s): AMMONIA in the last 168 hours. Coagulation Profile: Recent Labs  Lab 05/09/21 0456  INR 1.6*   Cardiac Enzymes: No results for input(s): CKTOTAL, CKMB,  CKMBINDEX, TROPONINI in the last 168 hours. BNP (last 3 results) No results for input(s): PROBNP in the last 8760 hours. HbA1C: No results for input(s): HGBA1C in the last 72 hours.  CBG: Recent Labs  Lab 05/09/21 1606 05/09/21 2015 05/10/21 0756 05/10/21 1206 05/10/21 1613  GLUCAP 125* 164* 133* 158* 156*   Lipid Profile: No results for input(s): CHOL, HDL, LDLCALC, TRIG, CHOLHDL, LDLDIRECT in the last 72 hours.  Thyroid Function Tests: No results for input(s): TSH, T4TOTAL, FREET4, T3FREE, THYROIDAB in the  last 72 hours.  Anemia Panel: No results for input(s): VITAMINB12, FOLATE, FERRITIN, TIBC, IRON, RETICCTPCT in the last 72 hours.  Urine analysis:    Component Value Date/Time   BILIRUBINUR neg 11/20/2015 1511   PROTEINUR neg 11/20/2015 1511   UROBILINOGEN 0.2 11/20/2015 1511   NITRITE neg 11/20/2015 1511   LEUKOCYTESUR Negative 11/20/2015 1511   Sepsis Labs: @LABRCNTIP (procalcitonin:4,lacticidven:4)  ) Recent Results (from the past 240 hour(s))  Blood Culture (routine x 2)     Status: None   Collection Time: 05-09-21  5:33 PM   Specimen: BLOOD  Result Value Ref Range Status   Specimen Description BLOOD BLOOD LEFT FOREARM  Final   Special Requests   Final    BOTTLES DRAWN AEROBIC AND ANAEROBIC Blood Culture adequate volume   Culture   Final    NO GROWTH 5 DAYS Performed at [facility], [address], [place], [place]    Report Status 05/08/2021 FINAL  Final  Blood Culture (routine x 2)     Status: None   Collection Time: 05/09/21  5:40 PM   Specimen: BLOOD  Result Value Ref Range Status   Specimen Description BLOOD BLOOD RIGHT FOREARM  Final   Special Requests   Final    BOTTLES DRAWN AEROBIC AND ANAEROBIC Blood Culture adequate volume   Culture   Final    NO GROWTH 5 DAYS Performed at [facility], [address], [place], [place]    Report Status 05/08/2021 FINAL  Final  MRSA Next Gen by PCR, Nasal      Status: None   Collection Time: 05/09/21  7:07 PM   Specimen: Nasal Mucosa; Nasal Swab  Result Value Ref Range Status   MRSA by PCR Next Gen NOT DETECTED NOT DETECTED Final    Comment: (NOTE) The GeneXpert MRSA Assay (FDA approved for NASAL specimens only), is one component of a comprehensive MRSA colonization surveillance program. It is not intended to diagnose MRSA infection nor to guide or monitor treatment for MRSA infections. Test performance is not FDA approved in patients less than 45 years old. Performed at [facility], [address], [place], [place]          Radiology Studies: [facility] Chest [place] 1 View  Result Date: 05/10/2021 CLINICAL DATA:  Dyspnea. EXAM: PORTABLE CHEST 1 VIEW COMPARISON:  CTA chest dated May 05, 2021. Chest x-ray dated May 09, 2021. FINDINGS: Stable cardiomediastinal silhouette. Reduced lung volumes compared to the prior study. Left greater than right peripheral basilar predominant interstitial airspace opacities appear similar. No pleural effusion or pneumothorax. No acute osseous abnormality. IMPRESSION: 1. Relatively unchanged multifocal pneumonia. Electronically Signed   By: May 05, 2021 M.D.   On: 05/10/2021 16:59        Scheduled Meds:  amLODipine  10 mg Oral Daily   baricitinib  2 mg Oral Daily   Chlorhexidine Gluconate Cloth  6 each Topical Q0600   insulin aspart  10 Units Intravenous Once   And   dextrose  1 ampule Intravenous Once   feeding supplement  237 mL Oral TID BM   furosemide  20 mg Intravenous BID   Gerhardt's butt cream   Topical BID   mouth rinse  15 mL Mouth Rinse BID   methylPREDNISolone (SOLU-MEDROL) injection  125 mg Intravenous Q12H   midodrine  5 mg Oral TID WC   multivitamin with minerals  1 tablet Oral Daily   patiromer  8.4 g Oral Daily  sodium chloride flush  3 mL Intravenous Q12H   sodium chloride  2 g Oral TID WC   Continuous Infusions:  sodium chloride     albumin human 12.5  g (05/11/21 1133)     LOS: 8 days    Time spent: 35 min    Silvano Bilis, MD [facility]   If 7PM-7AM, please contact night-coverage [email or web address] Password [facility] 05/11/2021, 12:23 PM

## 2021-05-11 NOTE — Progress Notes (Signed)
Pulmonary Medicine          Date: 05/11/2021,   MRN# [id number] Jonathan Salinas [birth year]     AdmissionWeight: 80.7 kg                 CurrentWeight: 72.4 kg   Refferring physician: Dr Ashok Pall   CHIEF COMPLAINT:   Acute hypoxemic respiratory failure due to Massive PE and COVID19 pneumonia   HISTORY OF PRESENT ILLNESS   This is a plasant 85yo male with hx of essential HTN and GERD came in with acute hypoxemic respiratory failure due to COVID19 infection. He was evaluated at [facility] on 10/3, and initially treated with doxycycline 100 mg bid for suspected bronchitis pending COVID-19 test results.  Pt is unvaccinated.  His COVID-19 results came back positive, however he was out of the window for antiviral treatment, and provider recommended supportive care.  On 10/10 pt reported each time he took the doxycycline as prescribed he vomited and he continued to have worsening shortness of breath, therefore EMS notified. EMS reported upon their arrival at pts home his O2 sats were 68% on RA.  EMS placed pt on NRB , pt received  solumedrol and duonebs with O2 sats increasing to the 80's. Upon arrival to the ER pt remained hypoxic with O2 sats in the low to mid 80's requiring transition to 100% HHFNC  L/min.  CXR concerning for multifocal pneumonia.  Lab results revealed wbc 21.0 and fibrinogen 535.  Pt received cefepime and vancomycin. Patient was found to have bilateral multifocal pneumonia and large clot burden PE. This was further managed with anticoagulation and vascular consultation. Patient was seen by surgery and had thrombectomy performed for treatment of pulmonary venous thrombosis.   05/11/21- patient noted to be in distress. Reviewed with RT Babmi and attending physician Dr Ashok Pall.  Recommendation for ETT and MV to be managed in MICU.     PAST MEDICAL HISTORY   Past Medical History:  Diagnosis Date   GERD (gastroesophageal reflux disease)    Hypertension       SURGICAL HISTORY   Past Surgical History:  Procedure Laterality Date   COLONOSCOPY  2010   cleared for 10 yrs   INNER EAR SURGERY     had a perforated eardrum   PULMONARY THROMBECTOMY Bilateral 05/02/2021   Procedure: PULMONARY THROMBECTOMY;  Surgeon: Renford Dills, MD;  Location: [facility] INVASIVE CV LAB;  Service: Cardiovascular;  Laterality: Bilateral;  Covid (+)   TONSILLECTOMY       FAMILY HISTORY   Family History  Problem Relation Age of Onset   Cancer Mother    Cancer Father    Prostate cancer Neg Hx    Bladder Cancer Neg Hx    Kidney cancer Neg Hx      SOCIAL HISTORY   Social History   Tobacco Use   Smoking status: Former   Smokeless tobacco: Never  Building services engineer Use: Never used  Substance Use Topics   Alcohol use: No    Alcohol/week: 0.0 standard drinks   Drug use: No     MEDICATIONS    Home Medication:    Current Medication:  Current Facility-Administered Medications:    0.9 %  sodium chloride infusion, 250 mL, Intravenous, PRN, Schnier, Latina Craver, MD   acetaminophen (TYLENOL) tablet 650 mg, 650 mg, Oral, Q4H PRN, Schnier, Latina Craver, MD   albumin human 25 % solution 12.5 g, 12.5 g, Intravenous, Daily, Vida Rigger, MD  albuterol (VENTOLIN HFA) 108 (90 Base) MCG/ACT inhaler 2 puff, 2 puff, Inhalation, Q6H PRN, Wouk, Wilfred Curtis, MD, 2 puff at 05/10/21 2331   amLODipine (NORVASC) tablet 10 mg, 10 mg, Oral, Daily, Wouk, Wilfred Curtis, MD, 10 mg at 05/10/21 1024   apixaban (ELIQUIS) tablet 10 mg, 10 mg, Oral, BID, 10 mg at 05/10/21 2036 **FOLLOWED BY** [START ON 05/15/2021] apixaban (ELIQUIS) tablet 5 mg, 5 mg, Oral, BID, Dolan, Carissa E, RPH   baricitinib (OLUMIANT) tablet 2 mg, 2 mg, Oral, Daily, Wouk, Wilfred Curtis, MD, 2 mg at 05/10/21 1024   Chlorhexidine Gluconate Cloth 2 % PADS 6 each, 6 each, Topical, Q0600, Schnier, Latina Craver, MD, 6 each at 05/11/21 0618   docusate sodium (COLACE) capsule 100 mg, 100 mg, Oral, BID PRN,  Schnier, Latina Craver, MD   feeding supplement (ENSURE ENLIVE / ENSURE PLUS) liquid 237 mL, 237 mL, Oral, TID BM, Schnier, Latina Craver, MD, 237 mL at 05/10/21 2035   furosemide (LASIX) injection 20 mg, 20 mg, Intravenous, BID, Vida Rigger, MD   Gerhardt's butt cream, , Topical, BID, Wouk, Wilfred Curtis, MD, Given at 05/10/21 2035   guaiFENesin-dextromethorphan (ROBITUSSIN DM) 100-10 MG/5ML syrup 10 mL, 10 mL, Oral, Q4H PRN, Schnier, Latina Craver, MD, 10 mL at 05/16/2021 2040   magnesium sulfate IVPB 2 g 50 mL, 2 g, Intravenous, Once, Vida Rigger, MD   MEDLINE mouth rinse, 15 mL, Mouth Rinse, BID, Schnier, Latina Craver, MD, 15 mL at 05/10/21 2036   methylPREDNISolone sodium succinate (SOLU-MEDROL) 40 mg/mL injection 40 mg, 40 mg, Intravenous, Q12H, Joselle Deeds, MD   midodrine (PROAMATINE) tablet 5 mg, 5 mg, Oral, TID WC, Vida Rigger, MD   multivitamin with minerals tablet 1 tablet, 1 tablet, Oral, Daily, Schnier, Latina Craver, MD, 1 tablet at 05/10/21 1024   ondansetron (ZOFRAN) injection 4 mg, 4 mg, Intravenous, Q6H PRN, Schnier, Latina Craver, MD   polyethylene glycol (MIRALAX / GLYCOLAX) packet 17 g, 17 g, Oral, Daily PRN, Schnier, Latina Craver, MD   sodium chloride flush (NS) 0.9 % injection 3 mL, 3 mL, Intravenous, Q12H, Schnier, Latina Craver, MD, 3 mL at 05/10/21 2035   sodium chloride flush (NS) 0.9 % injection 3 mL, 3 mL, Intravenous, PRN, Schnier, Latina Craver, MD, 3 mL at 05/08/21 1118   sodium chloride tablet 2 g, 2 g, Oral, TID WC, Markisha Meding, MD    ALLERGIES   Penicillins     REVIEW OF SYSTEMS    Review of Systems:  Gen:  Denies  fever, sweats, chills weigh loss  HEENT: Denies blurred vision, double vision, ear pain, eye pain, hearing loss, nose bleeds, sore throat Cardiac:  No dizziness, chest pain or heaviness, chest tightness,edema Resp:   Denies cough or sputum porduction, shortness of breath,wheezing, hemoptysis,  Gi: Denies swallowing difficulty, stomach pain, nausea or  vomiting, diarrhea, constipation, bowel incontinence Gu:  Denies bladder incontinence, burning urine Ext:   Denies Joint pain, stiffness or swelling Skin: Denies  skin rash, easy bruising or bleeding or hives Endoc:  Denies polyuria, polydipsia , polyphagia or weight change Psych:   Denies depression, insomnia or hallucinations   Other:  All other systems negative   VS: BP (!) 120/54 (BP Location: Left Arm)   Pulse 95   Temp 97.7 F (36.5 C) (Oral)   Resp 18   Ht 6\' 1"  (1.854 m)   Wt 72.4 kg   SpO2 93%   BMI 21.06 kg/m      PHYSICAL EXAM  GENERAL:NAD, no fevers, chills, no weakness no fatigue HEAD: Normocephalic, atraumatic.  EYES: Pupils equal, round, reactive to light. Extraocular muscles intact. No scleral icterus.  MOUTH: Moist mucosal membrane. Dentition intact. No abscess noted.  EAR, NOSE, THROAT: Clear without exudates. No external lesions. +HFNC NECK: Supple. No thyromegaly. No nodules. No JVD.  PULMONARY: decreased air entry bilaterally worse at bases CARDIOVASCULAR: S1 and S2. Regular rate and rhythm. No murmurs, rubs, or gallops. No edema. Pedal pulses 2+ bilaterally.  GASTROINTESTINAL: Soft, nontender, nondistended. No masses. Positive bowel sounds. No hepatosplenomegaly.  MUSCULOSKELETAL: No swelling, clubbing, or edema. Range of motion full in all extremities.  NEUROLOGIC: Cranial nerves II through XII are intact. No gross focal neurological deficits. Sensation intact. Reflexes intact.  SKIN: No ulceration, lesions, rashes, or cyanosis. Skin warm and dry. Turgor intact.  PSYCHIATRIC: Mood, affect within normal limits. The patient is awake, alert and oriented x 3. Insight, judgment intact.       IMAGING    DG Chest 2 View  Result Date: 04/26/2021 CLINICAL DATA:  Chest congestion, wheezing, and low-grade fever for the past 5 days. EXAM: CHEST - 2 VIEW COMPARISON:  Chest x-ray dated November 11, 2019. FINDINGS: The heart size and mediastinal contours are  within normal limits. Mild left lower lobe atelectasis. No focal consolidation, pleural effusion, or pneumothorax. No acute osseous abnormality. IMPRESSION: 1. Mild left lower lobe atelectasis. Electronically Signed   By: Obie Dredge M.D.   On: 04/26/2021 14:56   CT Angio Chest Pulmonary Embolism (PE) W or WO Contrast  Result Date: 05/05/2021 CLINICAL DATA:  Evaluate for pulmonary embolus EXAM: CT ANGIOGRAPHY CHEST WITH CONTRAST TECHNIQUE: Multidetector CT imaging of the chest was performed using the standard protocol during bolus administration of intravenous contrast. Multiplanar CT image reconstructions and MIPs were obtained to evaluate the vascular anatomy. CONTRAST:  52mL OMNIPAQUE IOHEXOL 350 MG/ML SOLN COMPARISON:  None. FINDINGS: Cardiovascular: Adequate contrast opacification of the pulmonary arteries. Pulmonary embolus seen in the distal right main pulmonary artery, segmental right upper and lower lobe pulmonary arteries, and bilateral subsegmental pulmonary arteries. RV/LV ratio is greater than 1. Normal heart size. No pericardial effusion. No significant coronary artery calcifications. Atherosclerotic disease of the thoracic aorta. Mediastinum/Nodes: Small hiatal hernia. Thyroid is unremarkable. Prominent subcentimeter mediastinal lymph nodes which are likely reactive. Reference subcarinal lymph node measuring 8 mm in short axis on series 5, image 134. Lungs/Pleura: Central airways are patent. Bilateral patchy peripheral predominant ground-glass opacities. No pleural effusion or pneumothorax, Upper Abdomen: Low-density liver lesions, largest measuring up to 3.3 cm, which are likely simple cysts. No acute abnormality. Musculoskeletal: No chest wall abnormality. No acute or significant osseous findings. Review of the MIP images confirms the above findings. IMPRESSION: Pulmonary embolus seen in the distal right main pulmonary artery, segmental right upper and lower lobe pulmonary arteries, and  bilateral subsegmental pulmonary arteries. RV/LV ratio is greater than 1, which is suggestive of heart right heart strain. Correlate with echocardiography. Patchy, bilateral peripheral predominant ground-glass opacities, compatible with history of COVID-19 infection. Aortic Atherosclerosis (ICD10-I70.0). Critical Value/emergent results were called by telephone at the time of interpretation on 05/05/2021 at 2:38 pm to RN Jonathan Salinas, who verbally acknowledged these results and will relay the results to the hospitalist on call. Electronically Signed   By: Allegra Lai M.D.   On: 05/05/2021 14:39   PERIPHERAL VASCULAR CATHETERIZATION  Result Date: 05/12/2021 See surgical note for result.  US Venous Img Lower Bilateral (DVT)  Result Date: 05/15/2021 CLINICAL DATA:  Bilateral lower extremity pain. Pulmonary embolism. History of COVID. Evaluate for lower extremity DVT. EXAM: BILATERAL LOWER EXTREMITY VENOUS DOPPLER ULTRASOUND TECHNIQUE: Gray-scale sonography with graded compression, as well as color Doppler and duplex ultrasound were performed to evaluate the lower extremity deep venous systems from the level of the common femoral vein and including the common femoral, femoral, profunda femoral, popliteal and calf veins including the posterior tibial, peroneal and gastrocnemius veins when visible. The superficial great saphenous vein was also interrogated. Spectral Doppler was utilized to evaluate flow at rest and with distal augmentation maneuvers in the common femoral, femoral and popliteal veins. COMPARISON:  None. FINDINGS: RIGHT LOWER EXTREMITY Common Femoral Vein: No evidence of thrombus. Normal compressibility, respiratory phasicity and response to augmentation. Saphenofemoral Junction: No evidence of thrombus. Normal compressibility and flow on color Doppler imaging. Profunda Femoral Vein: No evidence of thrombus. Normal compressibility and flow on color Doppler imaging. Femoral Vein: No evidence  of thrombus. Normal compressibility, respiratory phasicity and response to augmentation. Popliteal Vein: No evidence of thrombus. Normal compressibility, respiratory phasicity and response to augmentation. Calf Veins: No evidence of thrombus. Normal compressibility and flow on color Doppler imaging. Superficial Great Saphenous Vein: No evidence of thrombus. Normal compressibility. Other Findings: Note is made of an approximately 3.0 x 2.8 x 0.6 cm minimally complex fluid collection with the right popliteal fossa compatible with a Baker's cyst. LEFT LOWER EXTREMITY Common Femoral Vein: No evidence of thrombus. Normal compressibility, respiratory phasicity and response to augmentation. Saphenofemoral Junction: No evidence of thrombus. Normal compressibility and flow on color Doppler imaging. Profunda Femoral Vein: No evidence of thrombus. Normal compressibility and flow on color Doppler imaging. Femoral Vein: No evidence of thrombus. Normal compressibility, respiratory phasicity and response to augmentation. Popliteal Vein: No evidence of thrombus. Normal compressibility, respiratory phasicity and response to augmentation. Calf Veins: The examination is positive for occlusive DVT involving both paired posterior tibial veins (images 60 and 61). The peroneal veins appear patent where imaged. Superficial Great Saphenous Vein: No evidence of thrombus. Normal compressibility. Other Findings:  None. IMPRESSION: 1. Examination is positive for occlusive DVT involving both paired left posterior tibial veins. There is no extension of this distal tibial DVT to the more proximal venous system of the left lower extremity. 2. No evidence of DVT within the right lower extremity. Electronically Signed   By: Simonne Come M.D.   On: 04/29/2021 07:39   DG Chest Port 1 View  Result Date: 05/10/2021 CLINICAL DATA:  Dyspnea. EXAM: PORTABLE CHEST 1 VIEW COMPARISON:  CTA chest dated May 05, 2021. Chest x-ray dated May 03, 2021.  FINDINGS: Stable cardiomediastinal silhouette. Reduced lung volumes compared to the prior study. Left greater than right peripheral basilar predominant interstitial airspace opacities appear similar. No pleural effusion or pneumothorax. No acute osseous abnormality. IMPRESSION: 1. Relatively unchanged multifocal pneumonia. Electronically Signed   By: Obie Dredge M.D.   On: 05/10/2021 16:59   DG Chest Port 1 View  Result Date: 05/08/2021 CLINICAL DATA:  Shortness of breath, weakness, COVID EXAM: PORTABLE CHEST 1 VIEW COMPARISON:  04/26/2021 FINDINGS: Patchy bilateral airspace disease, new since prior study compatible with multifocal pneumonia. Heart is normal size. No visible effusions or pneumothorax. Aortic atherosclerosis. No acute bony abnormality. IMPRESSION: New extensive patchy bilateral airspace disease compatible with multifocal pneumonia. Electronically Signed   By: Charlett Nose M.D.   On: 05/09/2021 17:45   ECHOCARDIOGRAM COMPLETE  Result Date: 05/04/2021    ECHOCARDIOGRAM REPORT   Patient Name:   Jonathan Salinas  Wentzel Date of Exam: 05/04/2021 Medical Rec #:  [id number]        Height:       73.0 in Accession #:    [id number]       Weight:       162.3 lb Date of Birth:  [birth year]        BSA:          1.969 m Patient Age:    85 years         BP:           128/88 mmHg Patient Gender: M                HR:           32 bpm. Exam Location:  [facility] Procedure: 2D Echo, Color Doppler and Cardiac Doppler Indications:     Acute respiratory distress R06.03  History:         Patient has no prior history of Echocardiogram examinations.                  Risk Factors:Hypertension.  Sonographer:     Cristela Blue Referring Phys:  [id number] DANA E GRAVES Diagnosing Phys: Cristal Deer End MD  Sonographer Comments: Suboptimal parasternal window and no subcostal window. IMPRESSIONS  1. Left ventricular ejection fraction, by estimation, is 55 to 60%. The left ventricle has normal function. Left ventricular endocardial border  not optimally defined to evaluate regional wall motion. There is mild to moderate left ventricular hypertrophy. Left ventricular diastolic parameters are consistent with Grade I diastolic dysfunction (impaired relaxation).  2. Right ventricular systolic function is low normal. The right ventricular size is mildly enlarged.  3. The mitral valve is abnormal. Mild to moderate mitral valve regurgitation. No evidence of mitral stenosis.  4. The aortic valve was not well visualized. Aortic valve regurgitation is not visualized. No aortic stenosis is present. FINDINGS  Left Ventricle: Left ventricular ejection fraction, by estimation, is 55 to 60%. The left ventricle has normal function. Left ventricular endocardial border not optimally defined to evaluate regional wall motion. The left ventricular internal cavity size was normal in size. There is mild to moderate left ventricular hypertrophy. Left ventricular diastolic parameters are consistent with Grade I diastolic dysfunction (impaired relaxation). Right Ventricle: The right ventricular size is mildly enlarged. Right vetricular wall thickness was not well visualized. Right ventricular systolic function is low normal. Left Atrium: Left atrial size was normal in size. Right Atrium: Right atrial size was normal in size. Pericardium: The pericardium was not well visualized. Mitral Valve: The mitral valve is abnormal. Mild to moderate mitral valve regurgitation. No evidence of mitral valve stenosis. Tricuspid Valve: The tricuspid valve is not well visualized. Tricuspid valve regurgitation is not demonstrated. Aortic Valve: The aortic valve was not well visualized. Aortic valve regurgitation is not visualized. No aortic stenosis is present. Aortic valve mean gradient measures 2.0 mmHg. Aortic valve peak gradient measures 3.4 mmHg. Aortic valve area, by VTI measures 3.19 cm. Pulmonic Valve: The pulmonic valve was not well visualized. Aorta: The aortic root is normal in size  and structure. Pulmonary Artery: The pulmonary artery is not well seen. Venous: The inferior vena cava was not well visualized. IAS/Shunts: The interatrial septum was not well visualized.  LEFT VENTRICLE PLAX 2D LVIDd:         3.80 cm   Diastology LVIDs:         2.70 cm   LV e' medial:    5.76 cm/s LV  PW:         1.30 cm   LV E/e' medial:  9.3 LV IVS:        1.20 cm   LV e' lateral:   7.40 cm/s LVOT diam:     2.00 cm   LV E/e' lateral: 7.2 LV SV:         58 LV SV Index:   30 LVOT Area:     3.14 cm  RIGHT VENTRICLE RV Basal diam:  3.50 cm RV S prime:     11.88 cm/s TAPSE (M-mode): 1.6 cm LEFT ATRIUM           Index        RIGHT ATRIUM           Index LA diam:      4.40 cm 2.24 cm/m   RA Area:     13.80 cm LA Vol (A2C): 23.4 ml 11.89 ml/m  RA Volume:   29.90 ml  15.19 ml/m LA Vol (A4C): 35.7 ml 18.13 ml/m  AORTIC VALVE AV Area (Vmax):    3.13 cm AV Area (Vmean):   2.87 cm AV Area (VTI):     3.19 cm AV Vmax:           91.80 cm/s AV Vmean:          69.500 cm/s AV VTI:            0.183 m AV Peak Grad:      3.4 mmHg AV Mean Grad:      2.0 mmHg LVOT Vmax:         91.40 cm/s LVOT Vmean:        63.400 cm/s LVOT VTI:          0.186 m LVOT/AV VTI ratio: 1.02  AORTA Ao Root diam: 2.90 cm MITRAL VALVE               TRICUSPID VALVE MV Area (PHT): 6.02 cm    TR Peak grad:   16.2 mmHg MV Decel Time: 126 msec    TR Vmax:        201.00 cm/s MV E velocity: 53.60 cm/s MV A velocity: 94.70 cm/s  SHUNTS MV E/A ratio:  0.57        Systemic VTI:  0.19 m                            Systemic Diam: 2.00 cm Yvonne Kendall MD Electronically signed by Yvonne Kendall MD Signature Date/Time: 05/04/2021/6:49:13 PM    Final       ASSESSMENT/PLAN   Acute hypoxemic respiratory failure - present on admission  - COVID19 +  - supplemental O2 during my evaluation HFNC 80%/35L/min - CRP, fungitell, procalcitonin -will consider for bronchoscopy  -PT/OT  -please encourage patient to use incentive spirometer few times each hour while  hospitalized.      Bibasilar atelectasis   - contributing to hypoxemia  - Unable to perform aggressive BPH due to COVID precautions  -patient encouraged to use IS at bedside   - need to start metaNEB as soon as covid precautions down   Pulmonary edema  Interval CXR with interstitial opacification bilaterally      - need to diurese despite AKI will do with albumin today   Acute Kidney injury -contributing to hypoxemia with pulmonary congestion -dc nonessential nephrotoxins -hypoalbuminemia contributing to third spaced fluid shift -electrolyte derrangements with uremia and hyperkalemia  Acute PE -S/p thrombectomry  Remains on HFNC with severe hypoxemia with any activity -on eliquis     Thank you for allowing me to participate in the care of this patient.  Total face to face encounter time for this patient visit was >75min. >50% of the time was  spent in counseling and coordination of care.   Patient/Family are satisfied with care plan and all questions have been answered.  This document was prepared using Dragon voice recognition software and may include unintentional dictation errors.     Vida Rigger, M.D.  Division of Pulmonary & Critical Care Medicine  [facility]

## 2021-05-11 NOTE — Progress Notes (Signed)
ANTICOAGULATION CONSULT NOTE  Pharmacy Consult for heparin drip Indication: pulmonary embolus  Allergies  Allergen Reactions   Penicillins     Patient Measurements: Height: 6\' 1"  (185.4 cm) Weight: 72.4 kg (159 lb 9.8 oz) IBW/kg (Calculated) : 79.9 Heparin Dosing Weight: 72 kg  Vital Signs: Temp: 97.7 F (36.5 C) (10/18 0700) Temp Source: Oral (10/18 0700) BP: 134/87 (10/18 1100) Pulse Rate: 124 (10/18 1209)  Labs: Recent Labs    05/09/21 0456 05/10/21 0417 05/10/21 1312 05/11/21 0520 05/11/21 1242  HGB 12.5* 13.9  --  14.3  --   HCT 35.9* 41.3  --  43.2  --   PLT 356 372  --  385  --   APTT 31  --   --   --   --   LABPROT 19.1*  --   --   --   --   INR 1.6*  --   --   --   --   CREATININE 1.25* 1.26* 1.37* 1.37* 1.52*  TROPONINIHS  --   --  146*  --  150*    Estimated Creatinine Clearance: 37.7 mL/min (A) (by C-G formula based on SCr of 1.52 mg/dL (H)).   Medical History: Past Medical History:  Diagnosis Date   GERD (gastroesophageal reflux disease)    Hypertension      Assessment: 85 year old male presented with shortness of breath. Patient with PE s/p thrombectomy on 10/14. Patient has been on Eliquis. He was transferred to the ICU 10/18 after rapid response on the floor. He required intubation. Pharmacy consult for heparin.  Goal of Therapy:  Heparin level 0.3-0.7 units/ml aPTT 66-102 seconds Monitor platelets by anticoagulation protocol: Yes   Plan:  Given acute decompensation requiring transfer to ICU and intubation, will defer waiting until next dose of Eliquis to start heparin; however, will hold initial bolus.  Start heparin infusion at 1200 units/hr Check aPTT 8 hrs after start of infusion CBC and HL with morning labs  11/18, PharmD, BCPS Clinical Pharmacist 05/11/2021 2:45 PM

## 2021-05-11 NOTE — TOC Progression Note (Signed)
Transition of Care [facility]) - Progression Note    Patient Details  Name: Jonathan Salinas MRN: [id number] Date of Birth: Aug 03, 1935  Transition of [facility]) CM/SW Contact  Joseph Art, [place] Phone Number: 05/11/2021, 2:03 PM  Clinical Narrative:     Patient and moved to ICU and emergently intubated due to increased distress. CSW spoke with patient's son Jonathan Salinas, Jonathan Salinas) [phone] [facility]) to obtain collateral information.  Mr. Bitter was arriving to the hospital and requested we speak tomorrow.TOC will continue to follow.   Expected Discharge Plan: Skilled Nursing Facility Barriers to Discharge: Continued Medical Work up  Expected Discharge Plan and Services Expected Discharge Plan: Skilled Nursing Facility In-house Referral: Clinical Social Work     Living arrangements for the past 2 months: Single Family Home                                       Social Determinants of Health (SDOH) Interventions    Readmission Risk Interventions No flowsheet data found.

## 2021-05-11 NOTE — Consult Note (Addendum)
PHARMACY CONSULT NOTE - FOLLOW UP  Pharmacy Consult for Electrolyte Monitoring and Replacement   Recent Labs: Potassium (mmol/L)  Date Value  05/11/2021 5.6 (H)   Magnesium (mg/dL)  Date Value  74/16/3845 2.5 (H)   Calcium (mg/dL)  Date Value  36/46/8032 8.3 (L)   Albumin (g/dL)  Date Value  07/17/8249 2.4 (L)  07/29/2019 4.2   Phosphorus (mg/dL)  Date Value  03/70/4888 5.3 (H)   Sodium (mmol/L)  Date Value  05/11/2021 130 (L)  07/29/2019 141     Assessment: Patient admitted with shortness of breath. Diagnosed with COVID pneumonia, PE/DVT and hyperkalemia. Pharmacy consulted by dr Karna Christmas to manage electrolytes.  Goal of Therapy:  Electrolytes within normal limits  Plan:  Mg, K and Phos above goal range Order for IV Mg supplement discontinued Insulin aspart plus dextrose x 1 dose ordered V[place]assa daily ordered - okayed by MD MD to order additional therapy (as needed) to manage hyperphosphatemia Will continue to follow morning labs and replace electrolytes as needed.  Jonathan Salinas PharmD, BCPS 05/11/2021 9:10 AM

## 2021-05-11 NOTE — Progress Notes (Signed)
Pharmacy Antibiotic Note  GLENDALE Salinas is a 85 y.o. male admitted on 05/14/2021 with shortness of breath. Patient was transported to ICU 10/18 after rapid response on the floor.  Pharmacy has been consulted for Unasyn dosing for possible aspiration pneumonia.  Plan: Unasyn 3 g IV q6h  Height: 6\' 1"  (185.4 cm) Weight: 72.4 kg (159 lb 9.8 oz) IBW/kg (Calculated) : 79.9  Temp (24hrs), Avg:98.3 F (36.8 C), Min:97.7 F (36.5 C), Max:99 F (37.2 C)  Recent Labs  Lab 05/19/2021 0611 05/08/21 0524 05/09/21 0456 05/10/21 0417 05/10/21 1312 05/11/21 0520 05/11/21 1242  WBC 21.9* 20.3* 20.2* 22.6*  --  21.0*  --   CREATININE 1.30* 1.15 1.25* 1.26* 1.37* 1.37* 1.52*    Estimated Creatinine Clearance: 37.7 mL/min (A) (by C-G formula based on SCr of 1.52 mg/dL (H)).    Allergies  Allergen Reactions   Penicillins     Antimicrobials this admission: Vanc 10/10 >> 10/12 Cefepime 10/10 >> 10/12 Unasyn 10/18 >>   Microbiology results: 10/10 BCx: no growth 10/10 MRSA PCR: negative  Thank you for allowing pharmacy to be a part of this patient's care.  11/18, PharmD, BCPS Clinical Pharmacist 05/11/2021 2:40 PM

## 2021-05-11 NOTE — Procedures (Signed)
Intubation Procedure Note  Jonathan Salinas  [id number]  29-May-1936  Date:05/11/21  Time:12:46 PM   Provider Performing:Achaia Garlock D Elvina Sidle    Procedure: Intubation (31500)  Indication(s) Respiratory Failure  Consent Risks of the procedure as well as the alternatives and risks of each were explained to the patient and/or caregiver.  Consent for the procedure was obtained and is signed in the bedside chart   Anesthesia Etomidate, Fentanyl, and Rocuronium   Time Out Verified patient identification, verified procedure, site/side was marked, verified correct patient position, special equipment/implants available, medications/allergies/relevant history reviewed, required imaging and test results available.   Sterile Technique Usual hand hygeine, masks, and gloves were used   Procedure Description Patient positioned in bed supine.  Sedation given as noted above.  Patient was intubated with endotracheal tube using Glidescope.  View was Grade 1 full glottis .  Number of attempts was 1.  Colorimetric CO2 detector was consistent with tracheal placement.   Complications/Tolerance None; patient tolerated the procedure well. Chest X-ray is ordered to verify placement.   EBL Minimal   Specimen(s) None   Size 8.0 ETT  Secured at 25 cm at the lip.   [name], AGACNP-BC [facility] Prefer epic messenger for cross cover needs If after hours, please call E-link

## 2021-05-11 NOTE — Significant Event (Signed)
Rapid Response Event Note   Reason for Call :  Hypoxia, tachypnea, tachycardia  Initial Focused Assessment:  Arrived in room with RT placing patient on Bipap. Patient's heart rate in 130s/140s, RR in upper 30s to 40s with increased work of breathing, oxygen saturation in upper 80s low 90s. Per 2A staff patient with worsening respiratory status and had already notified Dr. Ashok Pall.  Interventions:  Dr. Ashok Pall arriving and transfer order already in place to transfer, so left 234 and went to prepare ICU for transfer arrival.  Plan of Care:  Transfer to ICU 20. Rapid response RN helped assist with patient being settled and emergent intubation.  Note when patient arrived in ICU 20 on Bipap had episode of emesis. Initially spit up a small quarter sized amount in mask, so RT and RNs removed mask and then patient had medium sized amount of emesis. After this episode, maintained oxygen on non-rebreather until intubation.  Event Summary:   MD Notified: Dr. Ashok Pall Call Time: 11:59 Arrival Time: 12:01 End Time: 12:05  Bennie Dallas, RN

## 2021-05-11 NOTE — Progress Notes (Signed)
RT call to room for pt desat due to coughing. Upon arrival pt desat in low 80's with NRB and HHFNC 55L 97%. Pt placed on BiPAP with ICU room request.

## 2021-05-11 NOTE — Procedures (Signed)
Arterial Catheter Insertion Procedure Note  Jonathan Salinas  [id number]  01-07-1936  Date:05/11/21  Time:3:36 PM    Provider Performing: Judithe Modest    Procedure: Insertion of Arterial Line (56387) with US guidance (56433)   Indication(s) Blood pressure monitoring and/or need for frequent ABGs  Consent Unable to obtain consent due to emergent nature of procedure.  Anesthesia None   Time Out Verified patient identification, verified procedure, site/side was marked, verified correct patient position, special equipment/implants available, medications/allergies/relevant history reviewed, required imaging and test results available.   Sterile Technique Maximal sterile technique including full sterile barrier drape, hand hygiene, sterile gown, sterile gloves, mask, hair covering, sterile ultrasound probe cover (if used).   Procedure Description Area of catheter insertion was cleaned with chlorhexidine and draped in sterile fashion. With real-time ultrasound guidance an arterial catheter was placed into the left femoral artery.  Appropriate arterial tracings confirmed on monitor.     Complications/Tolerance None; patient tolerated the procedure well.   EBL Minimal   Specimen(s) None  BIOPATCH applied to the insertion site.    [name], AGACNP-BC [facility] Prefer epic messenger for cross cover needs If after hours, please call E-link

## 2021-05-11 NOTE — Progress Notes (Signed)
  Chaplain On-Call responded to Rapid Response notification.  Patient has positive diagnosis of COVID, and Physician advised Chaplain not to enter the room.  Chaplain will be available for support as needed.  Chaplain Evelena Peat M.Div., [facility]

## 2021-05-11 NOTE — Procedures (Signed)
Central Venous Catheter Insertion Procedure Note  Jonathan Salinas  [id number]  1935-12-07  Date:05/11/21  Time:3:35 PM   Provider Performing:Jarriel Papillion D Elvina Sidle   Procedure: Insertion of Non-tunneled Central Venous Catheter(36556) with US guidance (24462)   Indication(s) Medication administration and Difficult access  Consent Unable to obtain consent due to emergent nature of procedure.  Anesthesia Topical only with 1% lidocaine   Timeout Verified patient identification, verified procedure, site/side was marked, verified correct patient position, special equipment/implants available, medications/allergies/relevant history reviewed, required imaging and test results available.  Sterile Technique Maximal sterile technique including full sterile barrier drape, hand hygiene, sterile gown, sterile gloves, mask, hair covering, sterile ultrasound probe cover (if used).  Procedure Description Area of catheter insertion was cleaned with chlorhexidine and draped in sterile fashion.  With real-time ultrasound guidance a central venous catheter was placed into the right internal jugular vein. Nonpulsatile blood flow and easy flushing noted in all ports.  The catheter was sutured in place and sterile dressing applied.  Complications/Tolerance None; patient tolerated the procedure well. Chest X-ray is ordered to verify placement for internal jugular or subclavian cannulation.   Chest x-ray is not ordered for femoral cannulation.  EBL Minimal  Specimen(s) None   Line secured at the 18 cm mark.  BIOPATCH applied to the insertion site.   [name], AGACNP-BC [facility] Prefer epic messenger for cross cover needs If after hours, please call E-link

## 2021-05-11 NOTE — Consult Note (Signed)
NAME:  Jonathan Salinas, MRN:  [id number], DOB:  05-12-1936, LOS: 8 ADMISSION DATE:  10-May-2021, CONSULTATION DATE:  05/11/2021 REFERRING MD:  Dr. Nuala Alpha, CHIEF COMPLAINT:  Acute Respiratory Distress   Brief Pt Description / Synopsis:  85 y.o. Male admitted with Acute Hypoxic Respiratory Failure in the setting of COVID-19 Pneumonia and Pulmonary Embolism.  Underwent mechanical thrombectomy on 05/08/2021.  Developed acute respiratory distress and hypoxia on 05/11/21 requiring emergent intubation and mechanical ventilation.  History of Present Illness:  This is an 85 yo male who presented to [facility] ER on 10/10 via EMS with c/o shortness of breath onset 09/29.Marland Kitchen  He was evaluated at [facility] on 10/3, and initially treated with doxycycline 100 mg bid for suspected bronchitis pending COVID-19 test results.  Pt is unvaccinated.  His COVID-19 results came back positive, however he was out of the window for antiviral treatment, and provider recommended supportive care.  On 10/10 pt reported each time he took the doxycycline as prescribed he vomited and he continued to have worsening shortness of breath, therefore EMS notified. EMS reported upon their arrival at pts home his O2 sats were 68% on RA.  EMS placed pt on NRB @15L , pt received  solumedrol and duonebs with O2 sats increasing to the 80's.   ED Course: Upon arrival to the ER pt remained hypoxic with O2 sats in the low to mid 80's requiring transition to 100% HHFNC @60  L/min.  CXR concerning for multifocal pneumonia.  Lab results revealed wbc 21.0 and fibrinogen 535.  Pt received cefepime and vancomycin.  PCCM team contacted for ICU admission.  See SIGNIFICANT HOSPITAL EVENTS section below for full details of the patient's hospital course.  Pertinent  Medical History  GERD  HTN  Micro Data:  04/26/21: SARS-CoV-2 PCR>> Positive 10-May-2021: Blood culture x2>> no growth 10-May-2021: MRSA PCR>> negative 05/11/2021: Tracheal  aspirate>>   Antimicrobials:  Vancomycin 10/10>>10/11 Cefepime 10/10>>10/12 Unasyn 10/18>>  Significant Hospital Events: Including procedures, antibiotic start and stop dates in addition to other pertinent events   2021/05/10: Pt admitted to ICU with acute hypoxic respiratory failure secondary to COVID-19 and multifocal pneumonia  05/05/21: Hospitalist took over care; Baricitinib started; CTA Chest with PE (right main pulmonary artery and bilateral subsegmental pulmonary arteries); + Right heart strain 05/06/21: Evaluated by Vascular Surgery, plan for Thrombectomy tomorrow 05/18/2021: Underwent Mechanical Thrombectomy of PE by Vascular Surgery; Venous 05/08/21 + for DVT Left posterior tibial veins 05/08/21: Transitioned to Eliquis 05/09/21: Transferred from Stepdown to Med-Surg (room 234) 05/10/21: Pulmonary consulted due to worsening oxygenation 05/11/21: Developed Acute Respiratory Distress and Hypoxia requiring transfer to ICU and EMERGENT INTUBATION, concern for possible aspiration  Interim History / Subjective:  -Patient transferred emergently to ICU due to severe acute respiratory distress and severe hypoxia -Patient was placed on BiPAP, however shortly after initiation of BiPAP noted to have small amount of vomiting, BiPAP removed and then had large episode of vomiting  -Required emergent intubation and mechanical ventilation  Objective   Blood pressure 134/87, pulse (!) 124, temperature 97.7 F (36.5 C), temperature source Oral, resp. rate (!) 39, height 6\' 1"  (1.854 m), weight 72.4 kg, SpO2 94 %.    Vent Mode: PRVC FiO2 (%):  [80 %-100 %] 100 % Set Rate:  [20 bmp] 20 bmp Vt Set:  [500 mL] 500 mL PEEP:  [12 cmH20] 12 cmH20 Plateau Pressure:  [30 cmH20] 30 cmH20   Intake/Output Summary (Last 24 hours) at 05/11/2021 1252 Last data filed at 05/11/2021  0500 Gross per 24 hour  Intake 480 ml  Output 1850 ml  Net -1370 ml   Filed Weights   05/20/2021 0440 05/08/21 0204 05/09/21 0455   Weight: 75.4 kg 74.3 kg 72.4 kg    Examination: General: Critically ill-appearing male, sitting in bed, on nonrebreather mask and heated high flow with severe respiratory distress HENT: Atraumatic, normocephalic, neck supple, no JVD Lungs: Coarse breath sounds bilaterally, severe tachypnea and accessory muscle use Cardiovascular: Tachycardia, regular rhythm, S1-S2, no murmurs, rubs, gallops Abdomen: Soft, nontender, nondistended, no guarding rebound tenderness, bowel sounds positive x4 Extremities: Normal bulk and tone, no deformities Neuro: Limited exam due to severe respiratory distress, patient does arouse to voice and is oriented to person and place GU: Deferred  Resolved Hospital Problem list     Assessment & Plan:   Acute Hypoxic & Hypercapnic Respiratory Failure in the setting of COVID-19 Pneumonia, Pulmonary Embolism, & suspected Aspiration -Mechanical ventilation via ARDS protocol, target PRVC 6 cc/kg -Wean PEEP and FiO2 as able to maintain O2 sats >88% -Goal plateau pressure less than 30, driving pressure less than 15 -Paralytics if necessary for vent synchrony, gas exchange -Cycle prone positioning if necessary for oxygenation -Deep sedation per PAD protocol, goal RASS -4, currently fentanyl, Propofol -Diuresis as blood pressure and renal function can tolerate, goal CVP 5-8 ~ currently on 20 mg Lasix BID -VAP prevention order set -Steroids -Follow inflammatory markers: Ferritin, D-dimer, CRP -Will d/c baricitinib 10/18 due to concern for aspiration -Vitamin C, zinc -Plan to repeat and check resp cultures  COVID-19 Infection ? Aspiration -Monitor fever curve -Trend WBC's & Procalcitonin -Follow cultures as above -Continue empiric Unasyn pending cultures & sensitivities -We will D/C baricitinib 10/18 due to concern for possible aspiration  Tachycardia, suspect due to acute respiratory distress Mildly elevated troponin, suspect demand ischemia PMHx:  Hypertension -Continuous cardiac monitoring -Check EKG -Maintain MAP >65 -Cautious IVF (goal: maintain net even to net negative fluid balance with Covid) -Vasopressors as needed to maintain MAP goal -Trend HS Troponin until peaked (150 ~) -BNP 40.8 -Echocardiogram 05/04/2021 with LVEF 55-60%, Grade I diastolic dysfunction, RV systolic function low normal  Acute Kidney Injury Hyponatremia Hyperkalemia ~ resolved -Monitor I&O's / urinary output -Follow BMP -Ensure adequate renal perfusion -Avoid nephrotoxic agents as able -Replace electrolytes as indicated  Pulmonary Embolism s/p Mechanical Thrombectomy on 10/14 Bilateral DVT -Echocardiogram showed evidence of Right heart strain -Transitioned to Eliquis on 10/15 ~ will plan to transition back to -Heparin gtt given current critical illness   Best Practice (right click and "Reselect all SmartList Selections" daily)   Diet/type: NPO DVT prophylaxis: systemic heparin GI prophylaxis: H2B Lines: will place central line due to possible pressors, limited Peripheral access Foley:  Will place due to critical illness Code Status:  full code Last date of multidisciplinary goals of care discussion [N/A]  Dr. Belia Heman updated pt's son at bedside 05/11/21.  Discussed critical illness with high risk for cardiac arrest and death. Prognosis is guarded.  Recommend DNR/DNI status.  Labs   CBC: Recent Labs  Lab 05/05/21 0054 05/06/21 0651 04/24/2021 0611 05/08/21 0524 05/09/21 0456 05/10/21 0417 05/11/21 0520  WBC 19.0* 15.6* 21.9* 20.3* 20.2* 22.6* 21.0*  NEUTROABS 17.1* 14.1* 20.1* 18.2*  --   --   --   HGB 11.0* 11.6* 12.1* 11.2* 12.5* 13.9 14.3  HCT 31.5* 34.1* 36.1* 32.9* 35.9* 41.3 43.2  MCV 92.1 91.9 94.3 94.0 94.0 93.9 94.7  PLT 278 320 353 347 356 372 385  Basic Metabolic Panel: Recent Labs  Lab 05/05/21 0054 05/06/21 0651 05/11/2021 0611 05/08/21 0524 05/09/21 0456 05/10/21 0417 05/10/21 1312 05/11/21 0520  05/11/21 0747  NA 134* 136 135 135 132* 130* 130* 130*  --   K 3.4* 4.4 4.5 4.7 5.1 5.6* 4.9 5.6*  --   CL 98 100 103 102 101 97* 97* 100  --   CO2 26 27 25 27 24 27 23 25   --   GLUCOSE 133* 154* 160* 150* 163* 146* 131* 124*  --   BUN 32* 27* 38* 31* 34* 44* 39* 51*  --   CREATININE 1.06 1.19 1.30* 1.15 1.25* 1.26* 1.37* 1.37*  --   CALCIUM 7.0* 7.8* 7.9* 7.9* 8.3* 8.4* 8.4* 8.3*  --   MG 2.2 2.5* 2.5* 2.3 2.4  --   --   --  2.5*  PHOS 2.9  --   --   --  4.1  --   --   --  5.3*   GFR: Estimated Creatinine Clearance: 41.8 mL/min (A) (by C-G formula based on SCr of 1.37 mg/dL (H)). Recent Labs  Lab 05/08/21 0524 05/09/21 0456 05/10/21 0417 05/11/21 0520  WBC 20.3* 20.2* 22.6* 21.0*    Liver Function Tests: Recent Labs  Lab 05/05/21 0054 05/06/21 0651 05/15/2021 0611 05/08/21 0524 05/11/21 0520  AST 42* 28 87* 33 43*  ALT 26 24 61* 44 72*  ALKPHOS 49 50 64 58 66  BILITOT 0.7 0.7 0.6 0.8 0.8  PROT 5.4* 5.9* 6.2* 5.9* 7.3  ALBUMIN 1.7* 1.9* 2.0* 2.0* 2.4*   No results for input(s): LIPASE, AMYLASE in the last 168 hours. No results for input(s): AMMONIA in the last 168 hours.  ABG    Component Value Date/Time   PHART 7.49 (H) 05/04/2021 0500   PCO2ART 29 (L) 05/04/2021 0500   PO2ART 71 (L) 05/04/2021 0500   HCO3 22.1 05/04/2021 0500   ACIDBASEDEF 0.1 05/04/2021 0500   O2SAT 95.4 05/04/2021 0500     Coagulation Profile: Recent Labs  Lab 05/09/21 0456  INR 1.6*    Cardiac Enzymes: No results for input(s): CKTOTAL, CKMB, CKMBINDEX, TROPONINI in the last 168 hours.  HbA1C: Hgb A1c MFr Bld  Date/Time Value Ref Range Status  05/04/2021 06:05 AM 5.5 4.8 - 5.6 % Final    Comment:    (NOTE)         Prediabetes: 5.7 - 6.4         Diabetes: >6.4         Glycemic control for adults with diabetes: <7.0     CBG: Recent Labs  Lab 05/09/21 1606 05/09/21 2015 05/10/21 0756 05/10/21 1206 05/10/21 1613  GLUCAP 125* 164* 133* 158* 156*    Review of Systems:    Unable to assess due to Acute Respiratory Distress   Past Medical History:  He,  has a past medical history of GERD (gastroesophageal reflux disease) and Hypertension.   Surgical History:   Past Surgical History:  Procedure Laterality Date   COLONOSCOPY  2010   cleared for 10 yrs   INNER EAR SURGERY     had a perforated eardrum   PULMONARY THROMBECTOMY Bilateral 05/15/2021   Procedure: PULMONARY THROMBECTOMY;  Surgeon: 05/09/2021, MD;  Location: [facility] INVASIVE CV LAB;  Service: Cardiovascular;  Laterality: Bilateral;  Covid (+)   TONSILLECTOMY       Social History:   reports that he has quit smoking. He has never used smokeless tobacco. He reports that he  does not drink alcohol and does not use drugs.   Family History:  His family history includes Cancer in his father and mother. There is no history of Prostate cancer, Bladder Cancer, or Kidney cancer.   Allergies Allergies  Allergen Reactions   Penicillins      Home Medications  Prior to Admission medications   Medication Sig Start Date End Date Taking? Authorizing Provider  doxycycline (VIBRAMYCIN) 100 MG capsule Take 1 capsule (100 mg total) by mouth 2 (two) times daily. 04/26/21  Yes Cook, Jayce G, DO  lisinopril (ZESTRIL) 10 MG tablet TAKE 1 TABLET BY MOUTH EVERY DAY 01/18/20  Yes [name], MD     Critical care time: 55 minutes     [name], AGACNP-BC [facility] Prefer epic messenger for cross cover needs If after hours, please call E-link

## 2021-05-12 DIAGNOSIS — U071 COVID-19: Secondary | ICD-10-CM | POA: Diagnosis not present

## 2021-05-12 DIAGNOSIS — J189 Pneumonia, unspecified organism: Secondary | ICD-10-CM | POA: Diagnosis not present

## 2021-05-12 DIAGNOSIS — E43 Unspecified severe protein-calorie malnutrition: Secondary | ICD-10-CM | POA: Insufficient documentation

## 2021-05-12 LAB — COMPREHENSIVE METABOLIC PANEL
ALT: 77 U/L — ABNORMAL HIGH (ref 0–44)
AST: 39 U/L (ref 15–41)
Albumin: 2.4 g/dL — ABNORMAL LOW (ref 3.5–5.0)
Alkaline Phosphatase: 63 U/L (ref 38–126)
Anion gap: 10 (ref 5–15)
BUN: 75 mg/dL — ABNORMAL HIGH (ref 8–23)
CO2: 21 mmol/L — ABNORMAL LOW (ref 22–32)
Calcium: 7.6 mg/dL — ABNORMAL LOW (ref 8.9–10.3)
Chloride: 98 mmol/L (ref 98–111)
Creatinine, Ser: 2.04 mg/dL — ABNORMAL HIGH (ref 0.61–1.24)
GFR, Estimated: 32 mL/min — ABNORMAL LOW (ref >60)
Glucose, Bld: 154 mg/dL — ABNORMAL HIGH (ref 70–99)
Potassium: 6.1 mmol/L — ABNORMAL HIGH (ref 3.5–5.1)
Sodium: 129 mmol/L — ABNORMAL LOW (ref 135–145)
Total Bilirubin: 1 mg/dL (ref 0.3–1.2)
Total Protein: 6.7 g/dL (ref 6.5–8.1)

## 2021-05-12 LAB — BASIC METABOLIC PANEL
Anion gap: 8 (ref 5–15)
BUN: 74 mg/dL — ABNORMAL HIGH (ref 8–23)
CO2: 25 mmol/L (ref 22–32)
Calcium: 7.6 mg/dL — ABNORMAL LOW (ref 8.9–10.3)
Chloride: 99 mmol/L (ref 98–111)
Creatinine, Ser: 2.16 mg/dL — ABNORMAL HIGH (ref 0.61–1.24)
GFR, Estimated: 30 mL/min — ABNORMAL LOW (ref >60)
Glucose, Bld: 105 mg/dL — ABNORMAL HIGH (ref 70–99)
Potassium: 5.3 mmol/L — ABNORMAL HIGH (ref 3.5–5.1)
Sodium: 132 mmol/L — ABNORMAL LOW (ref 135–145)

## 2021-05-12 LAB — APTT
aPTT: 118 seconds — ABNORMAL HIGH (ref 24–36)
aPTT: 63 seconds — ABNORMAL HIGH (ref 24–36)
aPTT: 79 seconds — ABNORMAL HIGH (ref 24–36)

## 2021-05-12 LAB — BLOOD GAS, ARTERIAL
Acid-base deficit: 3.6 mmol/L — ABNORMAL HIGH (ref 0.0–2.0)
Bicarbonate: 22.7 mmol/L (ref 20.0–28.0)
FIO2: 80
MECHVT: 500 mL
Mechanical Rate: 20
O2 Saturation: 97.6 %
PEEP: 12 cmH2O
Patient temperature: 37
pCO2 arterial: 44 mmHg (ref 32.0–48.0)
pH, Arterial: 7.32 — ABNORMAL LOW (ref 7.350–7.450)
pO2, Arterial: 105 mmHg (ref 83.0–108.0)

## 2021-05-12 LAB — POTASSIUM: Potassium: 5.6 mmol/L — ABNORMAL HIGH (ref 3.5–5.1)

## 2021-05-12 LAB — D-DIMER, QUANTITATIVE: D-Dimer, Quant: 3.17 ug/mL-FEU — ABNORMAL HIGH (ref 0.00–0.50)

## 2021-05-12 LAB — TRIGLYCERIDES: Triglycerides: 56 mg/dL (ref <150)

## 2021-05-12 LAB — CBC
HCT: 36.6 % — ABNORMAL LOW (ref 39.0–52.0)
Hemoglobin: 12.6 g/dL — ABNORMAL LOW (ref 13.0–17.0)
MCH: 33.1 pg (ref 26.0–34.0)
MCHC: 34.4 g/dL (ref 30.0–36.0)
MCV: 96.1 fL (ref 80.0–100.0)
Platelets: 365 10*3/uL (ref 150–400)
RBC: 3.81 MIL/uL — ABNORMAL LOW (ref 4.22–5.81)
RDW: 13.6 % (ref 11.5–15.5)
WBC: 20.2 10*3/uL — ABNORMAL HIGH (ref 4.0–10.5)
nRBC: 0 % (ref 0.0–0.2)

## 2021-05-12 LAB — HEPARIN LEVEL (UNFRACTIONATED)
Heparin Unfractionated: >1.1 IU/mL — ABNORMAL HIGH (ref 0.30–0.70)
Heparin Unfractionated: >1.1 IU/mL — ABNORMAL HIGH (ref 0.30–0.70)

## 2021-05-12 LAB — C-REACTIVE PROTEIN: CRP: 12.6 mg/dL — ABNORMAL HIGH (ref <1.0)

## 2021-05-12 LAB — PROCALCITONIN: Procalcitonin: 6.24 ng/mL

## 2021-05-12 MED ORDER — FREE WATER
30.0000 mL | Status: DC
  Administered 2021-05-12 – 2021-05-24 (×64): 30 mL

## 2021-05-12 MED ORDER — VITAL HIGH PROTEIN PO LIQD
1000.0000 mL | ORAL | Status: DC
  Administered 2021-05-12 – 2021-05-17 (×4): 1000 mL

## 2021-05-12 MED ORDER — SODIUM CHLORIDE 1 G PO TABS
2.0000 g | ORAL_TABLET | Freq: Three times a day (TID) | ORAL | Status: DC
  Administered 2021-05-12 – 2021-05-15 (×9): 2 g
  Filled 2021-05-12 (×10): qty 2

## 2021-05-12 MED ORDER — ENSURE ENLIVE PO LIQD: 237.0000 mL | Freq: Three times a day (TID) | ORAL | Status: DC

## 2021-05-12 MED ORDER — AMLODIPINE BESYLATE 10 MG PO TABS
10.0000 mg | ORAL_TABLET | Freq: Every day | ORAL | Status: DC
  Administered 2021-05-14: 10 mg
  Filled 2021-05-12: qty 1

## 2021-05-12 MED ORDER — INSULIN ASPART 100 UNIT/ML IV SOLN
10.0000 [IU] | Freq: Once | INTRAVENOUS | Status: AC
  Administered 2021-05-12: 10 [IU] via INTRAVENOUS
  Filled 2021-05-12: qty 0.1

## 2021-05-12 MED ORDER — METHYLPREDNISOLONE SODIUM SUCC 40 MG IJ SOLR
40.0000 mg | INTRAMUSCULAR | Status: DC
  Administered 2021-05-13: 40 mg via INTRAVENOUS
  Filled 2021-05-12: qty 1

## 2021-05-12 MED ORDER — MIDODRINE HCL 5 MG PO TABS
5.0000 mg | ORAL_TABLET | Freq: Three times a day (TID) | ORAL | Status: DC
  Administered 2021-05-12 – 2021-05-15 (×8): 5 mg
  Filled 2021-05-12 (×9): qty 1

## 2021-05-12 MED ORDER — POLYETHYLENE GLYCOL 3350 17 G PO PACK: 17.0000 g | PACK | Freq: Every day | ORAL | Status: DC | PRN

## 2021-05-12 MED ORDER — ADULT MULTIVITAMIN W/MINERALS CH
1.0000 | ORAL_TABLET | Freq: Every day | ORAL | Status: DC
  Administered 2021-05-13 – 2021-05-17 (×5): 1
  Filled 2021-05-12 (×5): qty 1

## 2021-05-12 MED ORDER — GUAIFENESIN-DM 100-10 MG/5ML PO SYRP: 10.0000 mL | ORAL_SOLUTION | ORAL | Status: DC | PRN

## 2021-05-12 MED ORDER — DOCUSATE SODIUM 50 MG/5ML PO LIQD: 100.0000 mg | Freq: Two times a day (BID) | ORAL | Status: DC | PRN

## 2021-05-12 MED ORDER — CALCIUM GLUCONATE-NACL 1-0.675 GM/50ML-% IV SOLN
1.0000 g | Freq: Once | INTRAVENOUS | Status: AC
  Administered 2021-05-12: 1000 mg via INTRAVENOUS
  Filled 2021-05-12: qty 50

## 2021-05-12 MED ORDER — DEXTROSE 50 % IV SOLN
50.0000 mL | Freq: Once | INTRAVENOUS | Status: AC
  Administered 2021-05-12: 50 mL via INTRAVENOUS
  Filled 2021-05-12: qty 50

## 2021-05-12 MED ORDER — SODIUM CHLORIDE 0.9 % IV BOLUS
1000.0000 mL | Freq: Once | INTRAVENOUS | Status: AC
  Administered 2021-05-12: 1000 mL via INTRAVENOUS

## 2021-05-12 MED ORDER — ACETAMINOPHEN 325 MG PO TABS
650.0000 mg | ORAL_TABLET | ORAL | Status: DC | PRN
  Administered 2021-05-19 – 2021-05-20 (×2): 650 mg
  Filled 2021-05-12 (×2): qty 2

## 2021-05-12 MED ORDER — FAMOTIDINE IN NACL 20-0.9 MG/50ML-% IV SOLN
20.0000 mg | INTRAVENOUS | Status: DC
  Administered 2021-05-13 – 2021-05-14 (×2): 20 mg via INTRAVENOUS
  Filled 2021-05-12 (×2): qty 50

## 2021-05-12 MED ORDER — SODIUM CHLORIDE 0.9 % IV SOLN
3.0000 g | Freq: Two times a day (BID) | INTRAVENOUS | Status: DC
  Administered 2021-05-12 – 2021-05-13 (×3): 3 g via INTRAVENOUS
  Filled 2021-05-12: qty 3
  Filled 2021-05-12: qty 8
  Filled 2021-05-12: qty 3
  Filled 2021-05-12: qty 8

## 2021-05-12 MED ORDER — DEXTROSE 50 % IV SOLN
1.0000 | Freq: Once | INTRAVENOUS | Status: AC
  Administered 2021-05-12: 50 mL via INTRAVENOUS
  Filled 2021-05-12: qty 50

## 2021-05-12 NOTE — Progress Notes (Signed)
 NAME:  Jonathan Salinas, MRN:  [id number], DOB:  03/04/1936, LOS: 9 ADMISSION DATE:  05/18/2021 85 y.o. Male admitted with Acute Hypoxic Respiratory Failure in the setting of COVID-19 Pneumonia and Pulmonary Embolism.  Underwent mechanical thrombectomy on 05/15/2021.  Developed acute respiratory distress and hypoxia on 05/11/21 requiring emergent intubation and mechanical ventilation.   History of Present Illness:  This is an 85 yo male who presented to [facility] ER on 10/10 via EMS with c/o shortness of breath onset 09/29..  He was evaluated at [facility] on 10/3, and initially treated with doxycycline 100 mg bid for suspected bronchitis pending COVID-19 test results.  Pt is unvaccinated.  His COVID-19 results came back positive, however he was out of the window for antiviral treatment, and provider recommended supportive care.  On 10/10 pt reported each time he took the doxycycline as prescribed he vomited and he continued to have worsening shortness of breath, therefore EMS notified. EMS reported upon their arrival at pts home his O2 sats were 68% on RA.  EMS placed pt on NRB @15L, pt received  solumedrol and duonebs with O2 sats increasing to the 80's.   ED Course: Upon arrival to the ER pt remained hypoxic with O2 sats in the low to mid 80's requiring transition to 100% HHFNC @60 L/min.  CXR concerning for multifocal pneumonia.  Lab results revealed wbc 21.0 and fibrinogen 535.  Pt received cefepime and vancomycin.  PCCM team contacted for ICU admission.   See SIGNIFICANT HOSPITAL EVENTS section below for full details of the patient's hospital course.   Pertinent  Medical History  GERD  HTN   Micro Data:  04/26/21: SARS-CoV-2 PCR>> Positive 05/12/2021: Blood culture x2>> no growth 05/12/2021: MRSA PCR>> negative 05/11/2021: Tracheal aspirate>>     Antimicrobials:  Vancomycin 10/10>>10/11 Cefepime 10/10>>10/12 Unasyn 10/18>>   Significant Hospital Events: Including procedures,  antibiotic start and stop dates in addition to other pertinent events   05/19/2021: Pt admitted to ICU with acute hypoxic respiratory failure secondary to COVID-19 and multifocal pneumonia  05/05/21: Hospitalist took over care; Baricitinib started; CTA Chest with PE (right main pulmonary artery and bilateral subsegmental pulmonary arteries); + Right heart strain 05/06/21: Evaluated by Vascular Surgery, plan for Thrombectomy tomorrow 05/20/2021: Underwent Mechanical Thrombectomy of PE by Vascular Surgery; Venous US + for DVT Left posterior tibial veins 05/08/21: Transitioned to Eliquis 05/09/21: Transferred from Stepdown to Med-Surg (room 234) 05/10/21: Pulmonary consulted due to worsening oxygenation 05/11/21: Developed Acute Respiratory Distress and Hypoxia requiring transfer to ICU and EMERGENT INTUBATION, concern for possible aspiration        Interim History / Subjective:  Remains on vent Critically ill Severe Hypoxia  Vent Mode: PRVC FiO2 (%):  [70 %-80 %] 70 % Set Rate:  [20 bmp] 20 bmp Vt Set:  [500 mL] 500 mL PEEP:  [12 cmH20] 12 cmH20 Plateau Pressure:  [25 cmH20-29 cmH20] 29 cmH20         Objective   Blood pressure 107/62, pulse 71, temperature 97.7 F (36.5 C), temperature source Axillary, resp. rate (!) 27, height 6' 1" (1.854 m), weight 79.1 kg, SpO2 96 %.    Vent Mode: PRVC FiO2 (%):  [70 %-80 %] 70 % Set Rate:  [20 bmp] 20 bmp Vt Set:  [500 mL] 500 mL PEEP:  [12 cmH20] 12 cmH20 Plateau Pressure:  [25 cmH20-29 cmH20] 29 cmH20   Intake/Output Summary (Last 24 hours) at 05/12/2021 1313 Last data filed at 05/12/2021 0931 Gross per 24   hour  Intake 2385.86 ml  Output 610 ml  Net 1775.86 ml   Filed Weights   05/08/21 0204 05/09/21 0455 05/12/21 0406  Weight: 74.3 kg 72.4 kg 79.1 kg      REVIEW OF SYSTEMS  PATIENT IS UNABLE TO PROVIDE COMPLETE REVIEW OF SYSTEMS DUE TO SEVERE CRITICAL ILLNESS AND TOXIC METABOLIC ENCEPHALOPATHY  ALL OTHER ROS ARE  NEGATIVE   PHYSICAL EXAMINATION:  GENERAL:critically ill appearing, +resp distress EYES: Pupils equal, round, reactive to light.  No scleral icterus.  MOUTH: Moist mucosal membrane. INTUBATED NECK: Supple.  PULMONARY: +rhonchi, +wheezing CARDIOVASCULAR: S1 and S2.  No murmurs  GASTROINTESTINAL: Soft, nontender, -distended. Positive bowel sounds.  MUSCULOSKELETAL: No swelling, clubbing, or edema.  NEUROLOGIC: obtunded SKIN:intact,warm,dry    Labs/imaging that I havepersonally reviewed  (right click and "Reselect all SmartList Selections" daily)      ASSESSMENT AND PLAN SYNOPSIS 85 yo with Acute Hypoxic & Hypercapnic Respiratory Failure in the setting of COVID-19 Pneumonia, Pulmonary Embolism, & suspected Aspiration Severe ACUTE Hypoxic and Hypercapnic Respiratory Failure -continue Mechanical Ventilator support -continue Bronchodilator Therapy -Wean Fio2 and PEEP as tolerated -VAP/VENT bundle implementation -will NOT perform SAT/SBT when respiratory parameters are met  Vent Mode: PRVC FiO2 (%):  [70 %-80 %] 70 % Set Rate:  [20 bmp] 20 bmp Vt Set:  [500 mL] 500 mL PEEP:  [12 cmH20] 12 cmH20 Plateau Pressure:  [25 cmH20-29 cmH20] 29 cmH20   Acute hypoxemic respiratory failure due to COVID-19 pneumonia/ARDS Mechanical ventilation via ARDS protocol, target PRVC 6 cc/kg Wean PEEP and FiO2 as able Goal plateau pressure less than 30, driving pressure less than 15 Paralytics if necessary for vent synchrony, gas exchange Cycle prone positioning if necessary for oxygenation Deep sedation per PAD protocol diuresis as tolerated based on Kidney function VAP prevention order set Follow inflammatory markers as needed with CRP Vitamin C, zinc as indicated Plan to repeat and check resp cultures if needed   CARDIAC ICU monitoring   ACUTE KIDNEY INJURY/Renal Failure -continue Foley Catheter-assess need -Avoid nephrotoxic agents -Follow urine output, BMP -Ensure adequate  renal perfusion, optimize oxygenation -Renal dose medications   Intake/Output Summary (Last 24 hours) at 05/12/2021 1313 Last data filed at 05/12/2021 0931 Gross per 24 hour  Intake 2385.86 ml  Output 610 ml  Net 1775.86 ml     NEUROLOGY Acute toxic metabolic encephalopathy, need for sedation Goal RASS -2 to -3   SEPTIC SHOCK SOURCE-COVID 19 infection -use vasopressors to keep MAP>65 as needed -follow ABG and LA -follow up cultures -emperic ABX   INFECTIOUS DISEASE -continue antibiotics as prescribed -follow up cultures   ENDO - ICU hypoglycemic\Hyperglycemia protocol -check FSBS per protocol   GI GI PROPHYLAXIS as indicated  NUTRITIONAL STATUS DIET-->TF's as tolerated Constipation protocol as indicated   ELECTROLYTES -follow labs as needed -replace as needed -pharmacy consultation and following   Diet/type: NPO DVT prophylaxis: systemic heparin GI prophylaxis: H2B Lines: will place central line due to possible pressors, limited Peripheral access Foley:  Will place due to critical illness Code Status:  full code   Labs   CBC: Recent Labs  Lab 05/06/21 0651 05/24/2021 0611 05/08/21 0524 05/09/21 0456 05/10/21 0417 05/11/21 0520 05/12/21 0353  WBC 15.6* 21.9* 20.3* 20.2* 22.6* 21.0* 20.2*  NEUTROABS 14.1* 20.1* 18.2*  --   --   --   --   HGB 11.6* 12.1* 11.2* 12.5* 13.9 14.3 12.6*  HCT 34.1* 36.1* 32.9* 35.9* 41.3 43.2 36.6*  MCV 91.9 94.3 94.0 94.0 93.9  94.7 96.1  PLT 320 353 347 356 372 385 244    Basic Metabolic Panel: Recent Labs  Lab 05/06/21 0651 04/28/2021 0611 05/08/21 0524 05/09/21 0456 05/10/21 0417 05/10/21 1312 05/11/21 0520 05/11/21 0747 05/11/21 1242 05/12/21 0353 05/12/21 0842  NA 136 135 135 132* 130* 130* 130*  --  132* 129*  --   K 4.4 4.5 4.7 5.1 5.6* 4.9 5.6*  --  4.5 6.1* 5.6*  CL 100 103 102 101 97* 97* 100  --  96* 98  --   CO2 _0 --  21* 21*  --   GLUCOSE 154* 160* 150* 163* 146* 131*  124*  --  49* 154*  --   BUN 27* 38* 31* 34* 44* 39* 51*  --  52* 75*  --   CREATININE 1.19 1.30* 1.15 1.25* 1.26* 1.37* 1.37*  --  1.52* 2.04*  --   CALCIUM 7.8* 7.9* 7.9* 8.3* 8.4* 8.4* 8.3*  --  8.7* 7.6*  --   MG 2.5* 2.5* 2.3 2.4  --   --   --  2.5*  --   --   --   PHOS  --   --   --  4.1  --   --   --  5.3*  --   --   --    GFR: Estimated Creatinine Clearance: 30.7 mL/min (A) (by C-G formula based on SCr of 2.04 mg/dL (H)). Recent Labs  Lab 05/09/21 0456 05/10/21 0417 05/11/21 0520 05/11/21 1242 05/12/21 0353  PROCALCITON  --   --   --  0.12 6.24  WBC 20.2* 22.6* 21.0*  --  20.2*    Liver Function Tests: Recent Labs  Lab 05/06/21 0651 04/26/2021 0611 05/08/21 0524 05/11/21 0520 05/12/21 0353  AST 28 87* 33 43* 39  ALT 24 61* 44 72* 77*  ALKPHOS 50 64 58 66 63  BILITOT 0.7 0.6 0.8 0.8 1.0  PROT 5.9* 6.2* 5.9* 7.3 6.7  ALBUMIN 1.9* 2.0* 2.0* 2.4* 2.4*   No results for input(s): LIPASE, AMYLASE in the last 168 hours. No results for input(s): AMMONIA in the last 168 hours.  ABG    Component Value Date/Time   PHART 7.32 (L) 05/12/2021 0500   PCO2ART 44 05/12/2021 0500   PO2ART 105 05/12/2021 0500   HCO3 22.7 05/12/2021 0500   ACIDBASEDEF 3.6 (H) 05/12/2021 0500   O2SAT 97.6 05/12/2021 0500     Coagulation Profile: Recent Labs  Lab 05/09/21 0456  INR 1.6*    Cardiac Enzymes: No results for input(s): CKTOTAL, CKMB, CKMBINDEX, TROPONINI in the last 168 hours.  HbA1C: Hgb A1c MFr Bld  Date/Time Value Ref Range Status  05/04/2021 06:05 AM 5.5 4.8 - 5.6 % Final    Comment:    (NOTE)         Prediabetes: 5.7 - 6.4         Diabetes: >6.4         Glycemic control for adults with diabetes: <7.0     CBG: Recent Labs  Lab 05/09/21 2015 05/10/21 0756 05/10/21 1206 05/10/21 1613 05/11/21 1301  GLUCAP 164* 133* 158* 156* 92    Allergies Allergies  Allergen Reactions   Penicillins        DVT/GI PRX  assessed I Assessed the need for Labs I  Assessed the need for Foley I Assessed the need for Central Venous Line Family Discussion when available I Assessed the need for Mobilization I  made an Assessment of medications to be adjusted accordingly Safety Risk assessment completed  CASE DISCUSSED IN MULTIDISCIPLINARY ROUNDS WITH ICU TEAM     Critical Care Time devoted to patient care services described in this note is 65 minutes.  Critical care was necessary to treat or prevent imminent or life-threatening deterioration.   PATIENT WITH VERY POOR PROGNOSIS I ANTICIPATE PROLONGED ICU LOS  Patient with Multiorgan failure and at high risk for cardiac arrest and death.     David , M.D.  [facility]  Medical Director ICU-[facility] [place] Medical Director [facility] Cardio-Pulmonary Department        

## 2021-05-12 NOTE — Progress Notes (Signed)
ANTICOAGULATION CONSULT NOTE  Pharmacy Consult for heparin drip Indication: pulmonary embolus  Allergies  Allergen Reactions   Penicillins     Patient Measurements: Height: 6\' 1"  (185.4 cm) Weight: 79.1 kg (174 lb 6.1 oz) IBW/kg (Calculated) : 79.9 Heparin Dosing Weight: 72 kg  Vital Signs: Temp: 97.6 F (36.4 C) (10/19 1200) Temp Source: Axillary (10/19 1200) BP: 120/63 (10/19 1700) Pulse Rate: 73 (10/19 1700)  Labs: Recent Labs    05/10/21 0417 05/10/21 1312 05/11/21 0520 05/11/21 1242 05/12/21 0018 05/12/21 0353 05/12/21 0842 05/12/21 1305 05/12/21 1610  HGB 13.9  --  14.3  --   --  12.6*  --   --   --   HCT 41.3  --  43.2  --   --  36.6*  --   --   --   PLT 372  --  385  --   --  365  --   --   --   APTT  --   --   --   --  63*  --  79*  --  118*  HEPARINUNFRC  --   --   --   --   --  >1.10* >1.10*  --   --   CREATININE 1.26* 1.37* 1.37* 1.52*  --  2.04*  --  2.16*  --   TROPONINIHS  --  146*  --  150*  --   --   --   --   --      Estimated Creatinine Clearance: 29 mL/min (A) (by C-G formula based on SCr of 2.16 mg/dL (H)).   Medical History: Past Medical History:  Diagnosis Date   GERD (gastroesophageal reflux disease)    Hypertension      Assessment: 85 year old male presented with shortness of breath. Patient with PE s/p thrombectomy on 10/14. Patient has been on Eliquis. He was transferred to the ICU 10/18 after rapid response on the floor. He required intubation. Pharmacy consulted for heparin monitoring.  Goal of Therapy:  Heparin level 0.3-0.7 units/ml aPTT 66-102 seconds Monitor platelets by anticoagulation protocol: Yes   Plan:  aPTT supratherapeutic: decrease heparin infusion rate to 1250 units/hr Recheck aPTT 8 hours after rate change CBC and heparin level with morning labs  11/18, PharmD, BCPS Clinical Pharmacist 05/12/2021 6:01 PM

## 2021-05-12 NOTE — TOC Initial Note (Signed)
Transition of Care [facility]) - Initial/Assessment Note    Patient Details  Name: Jonathan Salinas MRN: [id number] Date of Birth: 29-Mar-1936  Transition of Care [facility]) CM/SW Contact:    Hetty Ely, RN Phone Number: 05/12/2021, 9:25 AM  Clinical Narrative: Called son Jhace Fennell to discuss patient prior level of functioning. Son says patient was independent, independent with ADL's, able to shop and prepare meals, drives himself to medical visits and does his yard work. Did not use assisted device. Son did not know which pharmacy patient uses. Never used home health services. TOC to continue to track for discharge needs.                  Expected Discharge Plan: Home/Self Care Barriers to Discharge: Continued Medical Work up   Patient Goals and CMS Choice        Expected Discharge Plan and Services Expected Discharge Plan: Home/Self Care In-house Referral: Clinical Social Work     Living arrangements for the past 2 months: Single Family Home                                      Prior Living Arrangements/Services Living arrangements for the past 2 months: Single Family Home Lives with:: Self Patient language and need for interpreter reviewed:: Yes Do you feel safe going back to the place where you live?: Yes      Need for Family Participation in Patient Care: Yes (Comment) Care giver support system in place?: Yes (comment) (Son Theda Sers)   Criminal Activity/Legal Involvement Pertinent to Current Situation/Hospitalization: No - Comment as needed  Activities of Daily Living Home Assistive Devices/Equipment: Eyeglasses, Hearing aid ADL Screening (condition at time of admission) Patient's cognitive ability adequate to safely complete daily activities?: Yes Is the patient deaf or have difficulty hearing?: Yes Does the patient have difficulty seeing, even when wearing glasses/contacts?: Yes Does the patient have difficulty concentrating, remembering, or making  decisions?: No Patient able to express need for assistance with ADLs?: Yes Does the patient have difficulty dressing or bathing?: No Independently performs ADLs?: Yes (appropriate for developmental age) Does the patient have difficulty walking or climbing stairs?: No Weakness of Legs: None Weakness of Arms/Hands: None  Permission Sought/Granted Permission sought to share information with : Family Supports    Share Information with NAME: Al Bracewell (Son)   [phone]           Emotional Assessment Appearance:: Appears stated age Attitude/Demeanor/Rapport: Unable to Assess Affect (typically observed): Unable to Assess   Alcohol / Substance Use: Not Applicable Psych Involvement: No (comment)  Admission diagnosis:  Pneumonia of both lungs due to infectious organism, unspecified part of lung [J18.9] Sepsis, due to unspecified organism, unspecified whether acute organ dysfunction present (HCC) [A41.9] COVID-19 [U07.1] Patient Active Problem List   Diagnosis Date Noted   COVID-19    DVT (deep venous thrombosis) (HCC) 05-13-2021   Pulmonary embolism (HCC) 05/05/2021   Pneumonia due to COVID-19 virus 05/16/2021   Essential hypertension 06/25/2018   PCP:  [name], MD Pharmacy:   [facility] [address] [place] - [address] [place] [address] Phone: [phone] Fax: [phone]     Social Determinants of Health (SDOH) Interventions    Readmission Risk Interventions No flowsheet data found.

## 2021-05-12 NOTE — Progress Notes (Signed)
Vent rounded. Sputum collected and sent to lab

## 2021-05-12 NOTE — Consult Note (Signed)
PHARMACY CONSULT NOTE - FOLLOW UP  Pharmacy Consult for Electrolyte Monitoring and Replacement   Recent Labs: Potassium (mmol/L)  Date Value  05/12/2021 5.6 (H)   Magnesium (mg/dL)  Date Value  62/26/3335 2.5 (H)   Calcium (mg/dL)  Date Value  45/62/5638 7.6 (L)   Albumin (g/dL)  Date Value  93/73/4287 2.4 (L)  07/29/2019 4.2   Phosphorus (mg/dL)  Date Value  68/05/5725 5.3 (H)   Sodium (mmol/L)  Date Value  05/12/2021 129 (L)  07/29/2019 141     Assessment: Patient admitted with shortness of breath. Diagnosed with COVID pneumonia, PE/DVT and hyperkalemia. Patient s/p thrombectomy on 10/14. He has been on Eliquis. Patient required transfer to the ICU 10/18 after rapid response on the floor. He was intubated. Pharmacy consulted to manage electrolytes.  Goal of Therapy:  Electrolytes within normal limits  Plan:  Renal function with creatinine trending up. Hyperkalemia on labs this morning. Treated with repeat K 5.6. Plan to give insulin + dextrose again. No replacement indicated today. Continue to follow along.  Pricilla Riffle, PharmD, BCPS Clinical Pharmacist 05/12/2021 10:58 AM

## 2021-05-12 NOTE — Progress Notes (Signed)
ANTICOAGULATION CONSULT NOTE  Pharmacy Consult for heparin drip Indication: pulmonary embolus  Allergies  Allergen Reactions   Penicillins     Patient Measurements: Height: 6\' 1"  (185.4 cm) Weight: 72.4 kg (159 lb 9.8 oz) IBW/kg (Calculated) : 79.9 Heparin Dosing Weight: 72 kg  Vital Signs: Temp: 97.2 F (36.2 C) (10/18 2000) Temp Source: Axillary (10/18 2000) BP: 100/64 (10/18 2000) Pulse Rate: 76 (10/18 2000)  Labs: Recent Labs    05/09/21 0456 05/10/21 0417 05/10/21 1312 05/11/21 0520 05/11/21 1242 05/12/21 0018  HGB 12.5* 13.9  --  14.3  --   --   HCT 35.9* 41.3  --  43.2  --   --   PLT 356 372  --  385  --   --   APTT 31  --   --   --   --  63*  LABPROT 19.1*  --   --   --   --   --   INR 1.6*  --   --   --   --   --   CREATININE 1.25* 1.26* 1.37* 1.37* 1.52*  --   TROPONINIHS  --   --  146*  --  150*  --      Estimated Creatinine Clearance: 37.7 mL/min (A) (by C-G formula based on SCr of 1.52 mg/dL (H)).   Medical History: Past Medical History:  Diagnosis Date   GERD (gastroesophageal reflux disease)    Hypertension      Assessment: 85 year old male presented with shortness of breath. Patient with PE s/p thrombectomy on 10/14. Patient has been on Eliquis. He was transferred to the ICU 10/18 after rapid response on the floor. He required intubation. Pharmacy consult for heparin.  Goal of Therapy:  Heparin level 0.3-0.7 units/ml aPTT 66-102 seconds Monitor platelets by anticoagulation protocol: Yes  10/19 0018 aPTT 63, slightly subtherapeutic   Plan:  Increase heparin infusion to 1350 units/hr Recheck aPTT 8 hrs after rate change CBC and HL with morning labs  11/19, PharmD, [facility] 05/12/2021 12:57 AM

## 2021-05-12 NOTE — Progress Notes (Signed)
ANTICOAGULATION CONSULT NOTE  Pharmacy Consult for heparin drip Indication: pulmonary embolus  Allergies  Allergen Reactions   Penicillins     Patient Measurements: Height: 6\' 1"  (185.4 cm) Weight: 79.1 kg (174 lb 6.1 oz) IBW/kg (Calculated) : 79.9 Heparin Dosing Weight: 72 kg  Vital Signs: Temp: 97.7 F (36.5 C) (10/19 0900) Temp Source: Axillary (10/19 0900) BP: 95/60 (10/19 1000) Pulse Rate: 69 (10/19 1000)  Labs: Recent Labs    05/10/21 0417 05/10/21 1312 05/11/21 0520 05/11/21 1242 05/12/21 0018 05/12/21 0353 05/12/21 0842  HGB 13.9  --  14.3  --   --  12.6*  --   HCT 41.3  --  43.2  --   --  36.6*  --   PLT 372  --  385  --   --  365  --   APTT  --   --   --   --  63*  --  79*  HEPARINUNFRC  --   --   --   --   --  >1.10* >1.10*  CREATININE 1.26* 1.37* 1.37* 1.52*  --  2.04*  --   TROPONINIHS  --  146*  --  150*  --   --   --      Estimated Creatinine Clearance: 30.7 mL/min (A) (by C-G formula based on SCr of 2.04 mg/dL (H)).   Medical History: Past Medical History:  Diagnosis Date   GERD (gastroesophageal reflux disease)    Hypertension      Assessment: 85 year old male presented with shortness of breath. Patient with PE s/p thrombectomy on 10/14. Patient has been on Eliquis. He was transferred to the ICU 10/18 after rapid response on the floor. He required intubation. Pharmacy consult for heparin.  Goal of Therapy:  Heparin level 0.3-0.7 units/ml aPTT 66-102 seconds Monitor platelets by anticoagulation protocol: Yes  10/19 0018 aPTT 63, subtherapeutic 10/19 0842 aPTT 79, therapeutic   Plan:  Continue heparin infusion at 1350 units/hr Recheck aPTT at 1700 to confirm CBC and HL with morning labs  11/19, PharmD, BCPS Clinical Pharmacist 05/12/2021 10:53 AM

## 2021-05-12 NOTE — Progress Notes (Signed)
GOALS OF CARE DISCUSSION  The Clinical status was relayed to family in detail. Son at bedside Updated and notified of patients medical condition.    Patient remains unresponsive and will not open eyes to command.   Patient is having a weak cough and struggling to remove secretions.   Patient with increased WOB and using accessory muscles to breathe Explained to family course of therapy and the modalities    Patient with Progressive multiorgan failure with a very high probablity of a very minimal chance of meaningful recovery despite all aggressive and optimal medical therapy.  PATIENT REMAINS FULL CODE  Family understands the situation.    Family are satisfied with Plan of action and management. All questions answered  Additional CC time 35 mins   Jeannia Tatro David Seanpaul Preece, M.D.  [place] [facility]  Medical Director ICU-[facility] [place] Medical Director [facility]io[facility] Department    

## 2021-05-12 NOTE — Progress Notes (Signed)
Initial Nutrition Assessment  DOCUMENTATION CODES:   Severe malnutrition in context of social or environmental circumstances  INTERVENTION:   Vital HP @60ml /hr- Initiate at 21ml/hr and increase by 82ml/hr q 8 hours until goal rate is reached.   Propofol: 10.92 ml/hr- 288kcal/day   Free water flushes 62ml q4 hours to maintain tube patency   Regimen provides 1728kcal/day, 126g/day protein and 132ml/day free water   Liquid MVI daily via tube   Pt at high refeed risk; recommend monitor potassium, magnesium and phosphorus labs daily until stable  NUTRITION DIAGNOSIS:   Severe Malnutrition related to social / environmental circumstances (advanced age) as evidenced by severe fat depletion, severe muscle depletion.  GOAL:   Provide needs based on ASPEN/SCCM guidelines  MONITOR:   Vent status, Labs, Weight trends, TF tolerance, Skin, I & O's  REASON FOR ASSESSMENT:   Ventilator    ASSESSMENT:   85 y/o male with h/o HTN and GERD who is admitted with COVID PNA, b/l PE s/p thrombectomy 10/14, AKI and possible aspiration requiring intubation and ventilation.  Pt sedated and ventilated. OGT in place. Plan is to initiate tube feeds today. Pt is likely at refeed risk. Per chart, pt appears weight stable at baseline. Pt eating 50-90% of meals prior to being intubated but has remained on NPO/clear liquid diet for a good portion of his admit.   Medications reviewed and include: colace, solu-medrol, MVI, veltassa, miralax, NaCl tabs, unasyn, pepcid, levophed, propofol   Labs reviewed: Na 129(L), K 5.6(H), BUN 75(H), creat 2.04(H) Wbc- 20.2(H)  Patient is currently intubated on ventilator support MV: 11.2 L/min Temp (24hrs), Avg:97.5 F (36.4 C), Min:97 F (36.1 C), Max:98 F (36.7 C)  Propofol: 10.92 ml/hr- 288kcal/day   MAP- >94mmHg  UOP- 77m  NUTRITION - FOCUSED PHYSICAL EXAM:  Flowsheet Row Most Recent Value  Orbital Region Severe depletion  Upper Arm Region Severe  depletion  Thoracic and Lumbar Region Severe depletion  Buccal Region Severe depletion  Temple Region Severe depletion  Clavicle Bone Region Severe depletion  Clavicle and Acromion Bone Region Severe depletion  Scapular Bone Region Severe depletion  Dorsal Hand Severe depletion  Patellar Region Severe depletion  Anterior Thigh Region Severe depletion  Posterior Calf Region Severe depletion  Edema (RD Assessment) None  Hair Reviewed  Eyes Reviewed  Mouth Reviewed  Skin Reviewed  Nails Reviewed   Diet Order:   Diet Order             Diet NPO time specified  Diet effective now                  EDUCATION NEEDS:   No education needs have been identified at this time  Skin:  Skin Assessment: Reviewed RN Assessment (incision groin)  Last BM:  10/14- type 5- constipation  Height:   Ht Readings from Last 1 Encounters:  05/02/2021 6\' 1"  (1.854 m)    Weight:   Wt Readings from Last 1 Encounters:  05/12/21 79.1 kg    Ideal Body Weight:  83.6 kg  BMI:  Body mass index is 23.01 kg/m.  Estimated Nutritional Needs:   Kcal:  1748kcal/day  Protein:  110-125g/day  Fluid:  2.0-2.3L/day  MS, RD, LDN Please refer to [facility] for RD and/or RD on-call/weekend/after hours pager

## 2021-05-12 NOTE — Progress Notes (Signed)
PHARMACY NOTE:  ANTIMICROBIAL RENAL DOSAGE ADJUSTMENT  Current antimicrobial regimen includes a mismatch between antimicrobial dosage and estimated renal function.  As per policy approved by the Pharmacy & Therapeutics and Medical Executive Committees, the antimicrobial dosage will be adjusted accordingly.  Current antimicrobial dosage: Unasyn 3 g IV q6h  Indication: aspiration pneumonia  Renal Function:  Estimated Creatinine Clearance: 29 mL/min (A) (by C-G formula based on SCr of 2.16 mg/dL (H)).   Antimicrobial dosage has been changed to:  Unasyn 3 g IV q12h    Thank you for allowing pharmacy to be a part of this patient's care.  Pricilla Riffle, PharmD, BCPS Clinical Pharmacist 05/12/2021 2:42 PM

## 2021-05-13 DIAGNOSIS — J189 Pneumonia, unspecified organism: Secondary | ICD-10-CM | POA: Diagnosis not present

## 2021-05-13 DIAGNOSIS — U071 COVID-19: Secondary | ICD-10-CM | POA: Diagnosis not present

## 2021-05-13 LAB — CBC
HCT: 35.4 % — ABNORMAL LOW (ref 39.0–52.0)
Hemoglobin: 11.4 g/dL — ABNORMAL LOW (ref 13.0–17.0)
MCH: 31.8 pg (ref 26.0–34.0)
MCHC: 32.2 g/dL (ref 30.0–36.0)
MCV: 98.9 fL (ref 80.0–100.0)
Platelets: 331 10*3/uL (ref 150–400)
RBC: 3.58 MIL/uL — ABNORMAL LOW (ref 4.22–5.81)
RDW: 13.9 % (ref 11.5–15.5)
WBC: 15.6 10*3/uL — ABNORMAL HIGH (ref 4.0–10.5)
nRBC: 0 % (ref 0.0–0.2)

## 2021-05-13 LAB — COMPREHENSIVE METABOLIC PANEL
ALT: 51 U/L — ABNORMAL HIGH (ref 0–44)
AST: 20 U/L (ref 15–41)
Albumin: 2.3 g/dL — ABNORMAL LOW (ref 3.5–5.0)
Alkaline Phosphatase: 57 U/L (ref 38–126)
Anion gap: 7 (ref 5–15)
BUN: 71 mg/dL — ABNORMAL HIGH (ref 8–23)
CO2: 22 mmol/L (ref 22–32)
Calcium: 8 mg/dL — ABNORMAL LOW (ref 8.9–10.3)
Chloride: 102 mmol/L (ref 98–111)
Creatinine, Ser: 2.18 mg/dL — ABNORMAL HIGH (ref 0.61–1.24)
GFR, Estimated: 29 mL/min — ABNORMAL LOW (ref >60)
Glucose, Bld: 150 mg/dL — ABNORMAL HIGH (ref 70–99)
Potassium: 6.2 mmol/L — ABNORMAL HIGH (ref 3.5–5.1)
Sodium: 131 mmol/L — ABNORMAL LOW (ref 135–145)
Total Bilirubin: 0.7 mg/dL (ref 0.3–1.2)
Total Protein: 6.7 g/dL (ref 6.5–8.1)

## 2021-05-13 LAB — GLUCOSE, CAPILLARY
Glucose-Capillary: 119 mg/dL — ABNORMAL HIGH (ref 70–99)
Glucose-Capillary: 108 mg/dL — ABNORMAL HIGH (ref 70–99)

## 2021-05-13 LAB — BASIC METABOLIC PANEL
Anion gap: 6 (ref 5–15)
Anion gap: 7 (ref 5–15)
BUN: 81 mg/dL — ABNORMAL HIGH (ref 8–23)
BUN: 74 mg/dL — ABNORMAL HIGH (ref 8–23)
CO2: 24 mmol/L (ref 22–32)
CO2: 24 mmol/L (ref 22–32)
Calcium: 7.9 mg/dL — ABNORMAL LOW (ref 8.9–10.3)
Calcium: 7.9 mg/dL — ABNORMAL LOW (ref 8.9–10.3)
Chloride: 103 mmol/L (ref 98–111)
Chloride: 104 mmol/L (ref 98–111)
Creatinine, Ser: 2.03 mg/dL — ABNORMAL HIGH (ref 0.61–1.24)
Creatinine, Ser: 1.86 mg/dL — ABNORMAL HIGH (ref 0.61–1.24)
GFR, Estimated: 32 mL/min — ABNORMAL LOW (ref >60)
GFR, Estimated: 35 mL/min — ABNORMAL LOW (ref >60)
Glucose, Bld: 161 mg/dL — ABNORMAL HIGH (ref 70–99)
Glucose, Bld: 121 mg/dL — ABNORMAL HIGH (ref 70–99)
Potassium: 6 mmol/L — ABNORMAL HIGH (ref 3.5–5.1)
Potassium: 5.3 mmol/L — ABNORMAL HIGH (ref 3.5–5.1)
Sodium: 133 mmol/L — ABNORMAL LOW (ref 135–145)
Sodium: 135 mmol/L (ref 135–145)

## 2021-05-13 LAB — C-REACTIVE PROTEIN: CRP: 14.2 mg/dL — ABNORMAL HIGH (ref <1.0)

## 2021-05-13 LAB — HEPARIN LEVEL (UNFRACTIONATED): Heparin Unfractionated: >1.1 IU/mL — ABNORMAL HIGH (ref 0.30–0.70)

## 2021-05-13 LAB — APTT
aPTT: 53 seconds — ABNORMAL HIGH (ref 24–36)
aPTT: 99 seconds — ABNORMAL HIGH (ref 24–36)

## 2021-05-13 LAB — D-DIMER, QUANTITATIVE: D-Dimer, Quant: 2.5 ug/mL-FEU — ABNORMAL HIGH (ref 0.00–0.50)

## 2021-05-13 LAB — PROCALCITONIN: Procalcitonin: 5.13 ng/mL

## 2021-05-13 LAB — FUNGITELL, SERUM: Fungitell Result: <31 pg/mL (ref <80)

## 2021-05-13 MED ORDER — PATIROMER SORBITEX CALCIUM 8.4 G PO PACK
16.8000 g | PACK | Freq: Every day | ORAL | Status: DC
  Filled 2021-05-13: qty 2

## 2021-05-13 MED ORDER — DEXTROSE 50 % IV SOLN
1.0000 | Freq: Once | INTRAVENOUS | Status: AC
  Administered 2021-05-13: 50 mL via INTRAVENOUS
  Filled 2021-05-13: qty 50

## 2021-05-13 MED ORDER — BISACODYL 10 MG RE SUPP
10.0000 mg | Freq: Once | RECTAL | Status: AC
  Administered 2021-05-13: 10 mg via RECTAL
  Filled 2021-05-13: qty 1

## 2021-05-13 MED ORDER — INSULIN ASPART 100 UNIT/ML IV SOLN
10.0000 [IU] | Freq: Once | INTRAVENOUS | Status: AC
  Administered 2021-05-13: 10 [IU] via INTRAVENOUS
  Filled 2021-05-13: qty 0.1

## 2021-05-13 MED ORDER — HEPARIN BOLUS VIA INFUSION
1100.0000 [IU] | Freq: Once | INTRAVENOUS | Status: AC
  Administered 2021-05-13: 1100 [IU] via INTRAVENOUS
  Filled 2021-05-13: qty 1100

## 2021-05-13 MED ORDER — HYDROCORTISONE SOD SUC (PF) 100 MG IJ SOLR
50.0000 mg | Freq: Three times a day (TID) | INTRAMUSCULAR | Status: DC
  Administered 2021-05-13 – 2021-05-15 (×6): 50 mg via INTRAVENOUS
  Filled 2021-05-13 (×6): qty 2

## 2021-05-13 MED ORDER — SODIUM BICARBONATE 8.4 % IV SOLN
50.0000 meq | Freq: Once | INTRAVENOUS | Status: AC
  Administered 2021-05-13: 50 meq via INTRAVENOUS
  Filled 2021-05-13: qty 50

## 2021-05-13 MED ORDER — SODIUM ZIRCONIUM CYCLOSILICATE 5 G PO PACK
10.0000 g | PACK | Freq: Two times a day (BID) | ORAL | Status: DC
  Administered 2021-05-13 – 2021-05-14 (×4): 10 g
  Filled 2021-05-13 (×4): qty 2

## 2021-05-13 NOTE — Plan of Care (Signed)

## 2021-05-13 NOTE — Progress Notes (Signed)
NAME:  CAIO DEVERA, MRN:  [id number], DOB:  04-Apr-1936, LOS: 51 ADMISSION DATE:  04/30/2021 85 y.o. Male admitted with Acute Hypoxic Respiratory Failure in the setting of COVID-19 Pneumonia and Pulmonary Embolism.  Underwent mechanical thrombectomy on 05/17/2021.   Developed acute respiratory distress and hypoxia on 05/11/21 requiring emergent intubation and mechanical ventilation.     Pertinent  Medical History  GERD  HTN   Micro Data:  04/26/21: SARS-CoV-2 PCR>> Positive 05/13/2021: Blood culture x2>> no growth 05/21/2021: MRSA PCR>> negative 05/11/2021: Tracheal aspirate>>     Antimicrobials:  Vancomycin 10/10>>10/11 Cefepime 10/10>>10/12 Unasyn 10/18>>   Significant Hospital Events: Including procedures, antibiotic start and stop dates in addition to other pertinent events   05/24/2021: Pt admitted to ICU with acute hypoxic respiratory failure secondary to COVID-19 and multifocal pneumonia  05/05/21: Hospitalist took over care; Baricitinib started; CTA Chest with PE (right main pulmonary artery and bilateral subsegmental pulmonary arteries); + Right heart strain 05/06/21: Evaluated by Vascular Surgery, plan for Thrombectomy tomorrow 05/14/2021: Underwent Mechanical Thrombectomy of PE by Vascular Surgery; Venous US + for DVT Left posterior tibial veins 05/08/21: Transitioned to Eliquis 05/09/21: Transferred from [place] to [place] (room 234) 05/10/21: Pulmonary consulted due to worsening oxygenation 05/11/21: Developed Acute Respiratory Distress and Hypoxia requiring transfer to ICU and EMERGENT INTUBATION, concern for possible aspiration 10/19 severe hypoxia 10/20 severe hypoxia, unable to wean from vent        Interim History / Subjective:  Remains on vent Severe hypoxia Renal failure Unable to wean from vent  Vent Mode: PRVC FiO2 (%):  [60 %] 60 % Set Rate:  [20 bmp] 20 bmp Vt Set:  [500 mL] 500 mL PEEP:  [12 cmH20] 12 cmH20 Plateau Pressure:  [25 cmH20-29  cmH20] 25 cmH20         Objective   Blood pressure 135/60, pulse 77, temperature (!) 97.3 F (36.3 C), temperature source Axillary, resp. rate 20, height 6' 1"  (1.854 m), weight 74.9 kg, SpO2 99 %.    Vent Mode: PRVC FiO2 (%):  [60 %] 60 % Set Rate:  [20 bmp] 20 bmp Vt Set:  [500 mL] 500 mL PEEP:  [12 cmH20] 12 cmH20 Plateau Pressure:  [25 cmH20-29 cmH20] 25 cmH20   Intake/Output Summary (Last 24 hours) at 05/13/2021 0623 Last data filed at 05/13/2021 0900 Gross per 24 hour  Intake 3584.83 ml  Output 1865 ml  Net 1719.83 ml    Filed Weights   05/09/21 0455 05/12/21 0406 05/13/21 0442  Weight: 72.4 kg 79.1 kg 74.9 kg    REVIEW OF SYSTEMS  PATIENT IS UNABLE TO PROVIDE COMPLETE REVIEW OF SYSTEMS DUE TO SEVERE CRITICAL ILLNESS AND TOXIC METABOLIC ENCEPHALOPATHY   PHYSICAL EXAMINATION:  GENERAL:critically ill appearing, +resp distress EYES: Pupils equal, round, reactive to light.  No scleral icterus.  MOUTH: Moist mucosal membrane. INTUBATED NECK: Supple.  PULMONARY: +rhonchi, +wheezing CARDIOVASCULAR: S1 and S2.  No murmurs  GASTROINTESTINAL: Soft, nontender, -distended. Positive bowel sounds.  MUSCULOSKELETAL: No swelling, clubbing, or edema.  NEUROLOGIC: obtunded SKIN:intact,warm,dry    Labs/imaging that I havepersonally reviewed  (right click and "Reselect all SmartList Selections" daily)      ASSESSMENT AND PLAN SYNOPSIS 85 yo with Acute Hypoxic & Hypercapnic Respiratory Failure in the setting of COVID-19 Pneumonia, Pulmonary Embolism, & suspected Aspiration Severe ACUTE Hypoxic and Hypercapnic Respiratory Failure   Severe ACUTE Hypoxic and Hypercapnic Respiratory Failure -continue Mechanical Ventilator support -continue Bronchodilator Therapy -Wean Fio2 and PEEP as tolerated -VAP/VENT bundle implementation -will  NOT perform SAT/SBT when respiratory parameters are met   Vent Mode: PRVC FiO2 (%):  [60 %] 60 % Set Rate:  [20 bmp] 20 bmp Vt  Set:  [500 mL] 500 mL PEEP:  [12 cmH20] 12 cmH20 Plateau Pressure:  [25 cmH20-29 cmH20] 25 cmH20   Acute hypoxemic respiratory failure due to COVID-19 pneumonia/ARDS Mechanical ventilation via ARDS protocol, target PRVC 6 cc/kg Wean PEEP and FiO2 as able Goal plateau pressure less than 30, driving pressure less than 15 Paralytics if necessary for vent synchrony, gas exchange Cycle prone positioning if necessary for oxygenation Deep sedation per PAD protocol diuresis as tolerated based on Kidney function VAP prevention order set Follow inflammatory markers as needed with CRP Vitamin C, zinc as indicated Plan to repeat and check resp cultures if needed  CARDIAC ICU monitoring  ACUTE KIDNEY INJURY/Renal Failure -continue Foley Catheter-assess need -Avoid nephrotoxic agents -Follow urine output, BMP -Ensure adequate renal perfusion, optimize oxygenation -Renal dose medications Obtain nephrology consultation  Intake/Output Summary (Last 24 hours) at 05/13/2021 0924 Last data filed at 05/13/2021 0900 Gross per 24 hour  Intake 3584.83 ml  Output 1865 ml  Net 1719.83 ml    NEUROLOGY ACUTE TOXIC METABOLIC ENCEPHALOPATHY -need for sedation -Goal RASS -2 to -3   SEPTIC shock SOURCE-COVID 19 infection -use vasopressors to keep MAP>65 as needed -follow ABG and LA as needed -follow up cultures -emperic ABX  INFECTIOUS DISEASE -continue antibiotics as prescribed -follow up cultures  ENDO - ICU hypoglycemic\Hyperglycemia protocol -check FSBS per protocol   GI GI PROPHYLAXIS as indicated  NUTRITIONAL STATUS DIET-->TF's as tolerated Constipation protocol as indicated  ELECTROLYTES -follow labs as needed -replace as needed -pharmacy consultation and following  Diet/type: TF's as tolerated DVT prophylaxis: systemic heparin GI prophylaxis: H2B Lines: will place central line due to possible pressors, limited Peripheral access Code Status:  full code   Labs    CBC: Recent Labs  Lab 05/13/2021 0611 05/08/21 0524 05/09/21 0456 05/10/21 0417 05/11/21 0520 05/12/21 0353 05/13/21 0416  WBC 21.9* 20.3* 20.2* 22.6* 21.0* 20.2* 15.6*  NEUTROABS 20.1* 18.2*  --   --   --   --   --   HGB 12.1* 11.2* 12.5* 13.9 14.3 12.6* 11.4*  HCT 36.1* 32.9* 35.9* 41.3 43.2 36.6* 35.4*  MCV 94.3 94.0 94.0 93.9 94.7 96.1 98.9  PLT 353 347 356 372 385 365 331     Basic Metabolic Panel: Recent Labs  Lab 05/09/2021 0611 05/08/21 0524 05/09/21 0456 05/10/21 0417 05/11/21 0520 05/11/21 0747 05/11/21 1242 05/12/21 0353 05/12/21 0842 05/12/21 1305 05/13/21 0416  NA 135 135 132*   < > 130*  --  132* 129*  --  132* 131*  K 4.5 4.7 5.1   < > 5.6*  --  4.5 6.1* 5.6* 5.3* 6.2*  CL 103 102 101   < > 100  --  96* 98  --  99 102  CO2 25 27 24    < > 25  --  21* 21*  --  25 22  GLUCOSE 160* 150* 163*   < > 124*  --  49* 154*  --  105* 150*  BUN 38* 31* 34*   < > 51*  --  52* 75*  --  74* 71*  CREATININE 1.30* 1.15 1.25*   < > 1.37*  --  1.52* 2.04*  --  2.16* 2.18*  CALCIUM 7.9* 7.9* 8.3*   < > 8.3*  --  8.7* 7.6*  --  7.6* 8.0*  MG 2.5* 2.3 2.4  --   --  2.5*  --   --   --   --   --   PHOS  --   --  4.1  --   --  5.3*  --   --   --   --   --    < > = values in this interval not displayed.    GFR: Estimated Creatinine Clearance: 27.2 mL/min (A) (by C-G formula based on SCr of 2.18 mg/dL (H)). Recent Labs  Lab 05/10/21 0417 05/11/21 0520 05/11/21 1242 05/12/21 0353 05/13/21 0416  PROCALCITON  --   --  0.12 6.24 5.13  WBC 22.6* 21.0*  --  20.2* 15.6*     Liver Function Tests: Recent Labs  Lab 05/01/2021 0611 05/08/21 0524 05/11/21 0520 05/12/21 0353 05/13/21 0416  AST 87* 33 43* 39 20  ALT 61* 44 72* 77* 51*  ALKPHOS 64 58 66 63 57  BILITOT 0.6 0.8 0.8 1.0 0.7  PROT 6.2* 5.9* 7.3 6.7 6.7  ALBUMIN 2.0* 2.0* 2.4* 2.4* 2.3*    No results for input(s): LIPASE, AMYLASE in the last 168 hours. No results for input(s): AMMONIA in the last 168  hours.  ABG    Component Value Date/Time   PHART 7.32 (L) 05/12/2021 0500   PCO2ART 44 05/12/2021 0500   PO2ART 105 05/12/2021 0500   HCO3 22.7 05/12/2021 0500   ACIDBASEDEF 3.6 (H) 05/12/2021 0500   O2SAT 97.6 05/12/2021 0500      Coagulation Profile: Recent Labs  Lab 05/09/21 0456  INR 1.6*     Cardiac Enzymes: No results for input(s): CKTOTAL, CKMB, CKMBINDEX, TROPONINI in the last 168 hours.  HbA1C: Hgb A1c MFr Bld  Date/Time Value Ref Range Status  05/04/2021 06:05 AM 5.5 4.8 - 5.6 % Final    Comment:    (NOTE)         Prediabetes: 5.7 - 6.4         Diabetes: >6.4         Glycemic control for adults with diabetes: <7.0     CBG: Recent Labs  Lab 05/09/21 2015 05/10/21 0756 05/10/21 1206 05/10/21 1613 05/11/21 1301  GLUCAP 164* 133* 158* 156* 92     Allergies Allergies  Allergen Reactions   Penicillins         DVT/GI PRX  assessed I Assessed the need for Labs I Assessed the need for Foley I Assessed the need for Central Venous Line Family Discussion when available I Assessed the need for Mobilization I made an Assessment of medications to be adjusted accordingly Safety Risk assessment completed  CASE DISCUSSED IN MULTIDISCIPLINARY ROUNDS WITH ICU TEAM     Critical Care Time devoted to patient care services described in this note is 55 minutes.  Critical care was necessary to treat /prevent imminent and life-threatening deterioration. Overall, patient is critically ill, prognosis is guarded.  Patient with Multiorgan failure and at high risk for cardiac arrest and death.    Corrin Parker, M.D.  [facility]  Medical Director [place] Director [facility] Cardio-Pulmonary Department

## 2021-05-13 NOTE — Progress Notes (Addendum)
ANTICOAGULATION CONSULT NOTE  Pharmacy Consult for heparin drip Indication: pulmonary embolus  Allergies  Allergen Reactions   Penicillins     Patient Measurements: Height: 6\' 1"  (185.4 cm) Weight: 74.9 kg (165 lb 2 oz) IBW/kg (Calculated) : 79.9 Heparin Dosing Weight: 72 kg  Vital Signs: Temp: 97.8 F (36.6 C) (10/20 0400) Temp Source: Axillary (10/20 0400) BP: 104/60 (10/20 0600) Pulse Rate: 72 (10/20 0600)  Labs: Recent Labs    05/10/21 1312 05/10/21 1312 05/11/21 0520 05/11/21 1242 05/12/21 0018 05/12/21 0353 05/12/21 0842 05/12/21 1305 05/12/21 1610 05/13/21 0416  HGB  --    < > 14.3  --   --  12.6*  --   --   --  11.4*  HCT  --   --  43.2  --   --  36.6*  --   --   --  35.4*  PLT  --   --  385  --   --  365  --   --   --  331  APTT  --   --   --   --    < >  --  79*  --  118* 53*  HEPARINUNFRC  --   --   --   --   --  >1.10* >1.10*  --   --  >1.10*  CREATININE 1.37*  --  1.37* 1.52*  --  2.04*  --  2.16*  --  2.18*  TROPONINIHS 146*  --   --  150*  --   --   --   --   --   --    < > = values in this interval not displayed.     Estimated Creatinine Clearance: 27.2 mL/min (A) (by C-G formula based on SCr of 2.18 mg/dL (H)).   Medical History: Past Medical History:  Diagnosis Date   GERD (gastroesophageal reflux disease)    Hypertension      Assessment: 85 year old male presented with shortness of breath. Patient with PE s/p thrombectomy on 10/14. Patient has been on Eliquis. He was transferred to the ICU 10/18 after rapid response on the floor. He required intubation. Pharmacy consulted for heparin monitoring.  Goal of Therapy:  Heparin level 0.3-0.7 units/ml aPTT 66-102 seconds Monitor platelets by anticoagulation protocol: Yes   Plan:  aPTT subtherapeutic, HL remains supratherapeutic Bolus 1100 units x 1 Increase heparin infusion rate to 1350 units/hr Recheck aPTT 8 hours after rate change CBC and heparin level with morning labs  11/18, PharmD, [facility] 05/13/2021 6:25 AM

## 2021-05-13 NOTE — Progress Notes (Signed)
ANTICOAGULATION CONSULT NOTE  Pharmacy Consult for heparin drip Indication: pulmonary embolus  Allergies  Allergen Reactions   Penicillins     Patient Measurements: Height: 6\' 1"  (185.4 cm) Weight: 74.9 kg (165 lb 2 oz) IBW/kg (Calculated) : 79.9 Heparin Dosing Weight: 72 kg  Vital Signs: Temp: 97.3 F (36.3 C) (10/20 0900) Temp Source: Axillary (10/20 0900) BP: 131/65 (10/20 1600) Pulse Rate: 69 (10/20 1600)  Labs: Recent Labs    05/11/21 0520 05/11/21 1242 05/12/21 0018 05/12/21 0353 05/12/21 0842 05/12/21 1305 05/12/21 1610 05/13/21 0416 05/13/21 1151 05/13/21 1614  HGB 14.3  --   --  12.6*  --   --   --  11.4*  --   --   HCT 43.2  --   --  36.6*  --   --   --  35.4*  --   --   PLT 385  --   --  365  --   --   --  331  --   --   APTT  --   --    < >  --  79*  --  118* 53*  --  99*  HEPARINUNFRC  --   --   --  >1.10* >1.10*  --   --  >1.10*  --   --   CREATININE 1.37* 1.52*  --  2.04*  --  2.16*  --  2.18* 2.03*  --   TROPONINIHS  --  150*  --   --   --   --   --   --   --   --    < > = values in this interval not displayed.     Estimated Creatinine Clearance: 29.2 mL/min (A) (by C-G formula based on SCr of 2.03 mg/dL (H)).   Medical History: Past Medical History:  Diagnosis Date   GERD (gastroesophageal reflux disease)    Hypertension      Assessment: 85 year old male presented with shortness of breath. Patient with PE s/p thrombectomy on 10/14. Patient has been on Eliquis. He was transferred to the ICU 10/18 after rapid response on the floor. He required intubation. Pharmacy consulted for heparin monitoring.  10/20 0416 aPTT 53 HL > 1.1 10/20 1614 aPTT 99   Goal of Therapy:  Heparin level 0.3-0.7 units/ml once aPTT and heparin level correlate.  aPTT 66-102 seconds Monitor platelets by anticoagulation protocol: Yes   Plan:  aPTT is therapeutic. Will continue heparin at 1350 units/hr. Recheck aPTT in 8 hours. Heparin level and CBC with AM labs.  Switch to heparin level once aPTT and heparin level correlate.   11/20, PharmD  05/13/2021 4:53 PM

## 2021-05-13 NOTE — Consult Note (Signed)
PHARMACY CONSULT NOTE - FOLLOW UP  Pharmacy Consult for Electrolyte Monitoring and Replacement   Recent Labs: Potassium (mmol/L)  Date Value  05/13/2021 6.0 (H)   Magnesium (mg/dL)  Date Value  67/70/3403 2.5 (H)   Calcium (mg/dL)  Date Value  52/48/1859 7.9 (L)   Albumin (g/dL)  Date Value  09/31/1216 2.3 (L)  07/29/2019 4.2   Phosphorus (mg/dL)  Date Value  24/46/9507 5.3 (H)   Sodium (mmol/L)  Date Value  05/13/2021 133 (L)  07/29/2019 141     Assessment: Patient admitted with shortness of breath. Diagnosed with COVID pneumonia, PE/DVT and hyperkalemia. Patient s/p thrombectomy on 10/14. He has been on Eliquis. Patient required transfer to the ICU 10/18 after rapid response on the floor. He was intubated. Pharmacy consulted to manage electrolytes.  Goal of Therapy:  Electrolytes within normal limits  Plan:  Creatinine trending up. Remains hyperkalemic. Started on Lokelma BID. Continue to follow along.  Pricilla Riffle, PharmD, BCPS Clinical Pharmacist 05/13/2021 1:04 PM

## 2021-05-14 DIAGNOSIS — Z7189 Other specified counseling: Secondary | ICD-10-CM

## 2021-05-14 LAB — BLOOD GAS, ARTERIAL
Acid-Base Excess: 1.1 mmol/L (ref 0.0–2.0)
Bicarbonate: 26.6 mmol/L (ref 20.0–28.0)
FIO2: 0.5
MECHVT: 500 mL
Mechanical Rate: 20
O2 Saturation: 93.7 %
PEEP: 8 cmH2O
Patient temperature: 37
pCO2 arterial: 45 mmHg (ref 32.0–48.0)
pH, Arterial: 7.38 (ref 7.350–7.450)
pO2, Arterial: 71 mmHg — ABNORMAL LOW (ref 83.0–108.0)

## 2021-05-14 LAB — C-REACTIVE PROTEIN: CRP: 13.2 mg/dL — ABNORMAL HIGH (ref <1.0)

## 2021-05-14 LAB — CBC
HCT: 31.6 % — ABNORMAL LOW (ref 39.0–52.0)
Hemoglobin: 10.4 g/dL — ABNORMAL LOW (ref 13.0–17.0)
MCH: 31.7 pg (ref 26.0–34.0)
MCHC: 32.9 g/dL (ref 30.0–36.0)
MCV: 96.3 fL (ref 80.0–100.0)
Platelets: 289 10*3/uL (ref 150–400)
RBC: 3.28 MIL/uL — ABNORMAL LOW (ref 4.22–5.81)
RDW: 14 % (ref 11.5–15.5)
WBC: 14.9 10*3/uL — ABNORMAL HIGH (ref 4.0–10.5)
nRBC: 0 % (ref 0.0–0.2)

## 2021-05-14 LAB — COMPREHENSIVE METABOLIC PANEL
ALT: 42 U/L (ref 0–44)
AST: 34 U/L (ref 15–41)
Albumin: 2.2 g/dL — ABNORMAL LOW (ref 3.5–5.0)
Alkaline Phosphatase: 53 U/L (ref 38–126)
Anion gap: 5 (ref 5–15)
BUN: 68 mg/dL — ABNORMAL HIGH (ref 8–23)
CO2: 25 mmol/L (ref 22–32)
Calcium: 8 mg/dL — ABNORMAL LOW (ref 8.9–10.3)
Chloride: 106 mmol/L (ref 98–111)
Creatinine, Ser: 1.64 mg/dL — ABNORMAL HIGH (ref 0.61–1.24)
GFR, Estimated: 41 mL/min — ABNORMAL LOW (ref >60)
Glucose, Bld: 153 mg/dL — ABNORMAL HIGH (ref 70–99)
Potassium: 4.9 mmol/L (ref 3.5–5.1)
Sodium: 136 mmol/L (ref 135–145)
Total Bilirubin: 0.8 mg/dL (ref 0.3–1.2)
Total Protein: 6.4 g/dL — ABNORMAL LOW (ref 6.5–8.1)

## 2021-05-14 LAB — GLUCOSE, CAPILLARY
Glucose-Capillary: 143 mg/dL — ABNORMAL HIGH (ref 70–99)
Glucose-Capillary: 150 mg/dL — ABNORMAL HIGH (ref 70–99)
Glucose-Capillary: 112 mg/dL — ABNORMAL HIGH (ref 70–99)

## 2021-05-14 LAB — APTT
aPTT: 146 seconds — ABNORMAL HIGH (ref 24–36)
aPTT: 69 seconds — ABNORMAL HIGH (ref 24–36)
aPTT: 87 seconds — ABNORMAL HIGH (ref 24–36)

## 2021-05-14 LAB — PHOSPHORUS: Phosphorus: 3.7 mg/dL (ref 2.5–4.6)

## 2021-05-14 LAB — HEPARIN LEVEL (UNFRACTIONATED): Heparin Unfractionated: >1.1 IU/mL — ABNORMAL HIGH (ref 0.30–0.70)

## 2021-05-14 LAB — MAGNESIUM: Magnesium: 2.8 mg/dL — ABNORMAL HIGH (ref 1.7–2.4)

## 2021-05-14 LAB — D-DIMER, QUANTITATIVE: D-Dimer, Quant: 1.49 ug/mL-FEU — ABNORMAL HIGH (ref 0.00–0.50)

## 2021-05-14 MED ORDER — HEPARIN (PORCINE) 25000 UT/250ML-% IV SOLN
1450.0000 [IU]/h | INTRAVENOUS | Status: DC
  Administered 2021-05-14 (×2): 1150 [IU]/h via INTRAVENOUS
  Administered 2021-05-16: 1100 [IU]/h via INTRAVENOUS
  Administered 2021-05-16: 1200 [IU]/h via INTRAVENOUS
  Administered 2021-05-17: 1300 [IU]/h via INTRAVENOUS
  Administered 2021-05-18: 1450 [IU]/h via INTRAVENOUS
  Filled 2021-05-14 (×5): qty 250

## 2021-05-14 MED ORDER — SODIUM CHLORIDE 0.9 % IV SOLN
2.0000 g | Freq: Two times a day (BID) | INTRAVENOUS | Status: DC
  Administered 2021-05-14 – 2021-05-19 (×10): 2 g via INTRAVENOUS
  Filled 2021-05-14 (×13): qty 2

## 2021-05-14 MED ORDER — FAMOTIDINE 20 MG PO TABS
20.0000 mg | ORAL_TABLET | Freq: Two times a day (BID) | ORAL | Status: DC
  Administered 2021-05-14 – 2021-05-20 (×12): 20 mg
  Filled 2021-05-14 (×12): qty 1

## 2021-05-14 MED ORDER — SODIUM CHLORIDE 0.9 % IV SOLN
3.0000 g | Freq: Three times a day (TID) | INTRAVENOUS | Status: DC
  Administered 2021-05-14 (×2): 3 g via INTRAVENOUS
  Filled 2021-05-14 (×5): qty 8

## 2021-05-14 NOTE — Progress Notes (Signed)
ANTICOAGULATION CONSULT NOTE  Pharmacy Consult for heparin drip Indication: pulmonary embolus  Allergies  Allergen Reactions   Penicillins     Patient Measurements: Height: 6\' 1"  (185.4 cm) Weight: 75.7 kg (166 lb 14.2 oz) IBW/kg (Calculated) : 79.9 Heparin Dosing Weight: 72 kg  Vital Signs: Temp: 97.5 F (36.4 C) (10/21 1600) Temp Source: Axillary (10/21 1600) BP: 116/59 (10/21 1800) Pulse Rate: 100 (10/21 1800)  Labs: Recent Labs    05/12/21 0353 05/12/21 0842 05/12/21 1305 05/13/21 0416 05/13/21 1151 05/13/21 1614 05/13/21 2157 05/14/21 0002 05/14/21 0511 05/14/21 1104 05/14/21 1918  HGB 12.6*  --   --  11.4*  --   --   --   --  10.4*  --   --   HCT 36.6*  --   --  35.4*  --   --   --   --  31.6*  --   --   PLT 365  --   --  331  --   --   --   --  289  --   --   APTT  --  79*   < > 53*  --    < >  --  146*  --  69* 87*  HEPARINUNFRC >1.10* >1.10*  --  >1.10*  --   --   --   --  >1.10*  --   --   CREATININE 2.04*  --    < > 2.18* 2.03*  --  1.86*  --  1.64*  --   --    < > = values in this interval not displayed.     Estimated Creatinine Clearance: 36.5 mL/min (A) (by C-G formula based on SCr of 1.64 mg/dL (H)).   Medical History: Past Medical History:  Diagnosis Date   GERD (gastroesophageal reflux disease)    Hypertension      Assessment: 85 year old male presented with shortness of breath. Patient with PE s/p thrombectomy on 10/14. Patient has been on Eliquis. He was transferred to the ICU 10/18 after rapid response on the floor. He required intubation. Pharmacy consulted for heparin monitoring.  10/20 0416 aPTT 53; HL > 1.1   1250 > 1350 un/hr 10/20 1614 aPTT 99 10/21 0002 aPTT 146, supratherapeutic; 1350 > 1150 un/hr 10/21 1104 aPTT 69, therapeutic x1 1150 un/hr 10/21 1104 aPTT 87, therapeutic x2 1150un/hr  Goal of Therapy:  Heparin level 0.3-0.7 units/ml once aPTT and heparin level correlate.  aPTT 66-102 seconds Monitor platelets by  anticoagulation protocol: Yes   Plan:  aPTT therapeutic x2; 87s (goal 66-102s) Continue heparin drip at 1150 units/hr Recheck aPTT with Heparin level and CBC on AM labs  Switch to heparin level alone once aPTT and heparin level correlate  11/21, PharmD, [facility] Clinical Pharmacist 05/14/2021 7:50 PM

## 2021-05-14 NOTE — Consult Note (Signed)
PHARMACY CONSULT NOTE - FOLLOW UP  Pharmacy Consult for Electrolyte Monitoring and Replacement   Recent Labs: Potassium (mmol/L)  Date Value  05/14/2021 4.9   Magnesium (mg/dL)  Date Value  44/07/270 2.8 (H)   Calcium (mg/dL)  Date Value  53/66/4403 8.0 (L)   Albumin (g/dL)  Date Value  47/42/5956 2.2 (L)  07/29/2019 4.2   Phosphorus (mg/dL)  Date Value  38/75/6433 3.7   Sodium (mmol/L)  Date Value  05/14/2021 136  07/29/2019 141     Assessment: Patient admitted with shortness of breath. Diagnosed with COVID pneumonia, PE/DVT and hyperkalemia. Patient s/p thrombectomy on 10/14. He has been on Eliquis. Patient required transfer to the ICU 10/18 after rapid response on the floor. He was intubated. Pharmacy consulted to manage electrolytes.  Goal of Therapy:  Electrolytes within normal limits  Plan:  Creatinine now improving No electrolyte replacement indicated Continue to follow along  Pricilla Riffle, PharmD, BCPS Clinical Pharmacist 05/14/2021 11:20 AM

## 2021-05-14 NOTE — Progress Notes (Signed)
ANTICOAGULATION CONSULT NOTE  Pharmacy Consult for heparin drip Indication: pulmonary embolus  Allergies  Allergen Reactions   Penicillins     Patient Measurements: Height: 6\' 1"  (185.4 cm) Weight: 74.9 kg (165 lb 2 oz) IBW/kg (Calculated) : 79.9 Heparin Dosing Weight: 72 kg  Vital Signs: Temp: 96.1 F (35.6 C) (10/20 1932) Temp Source: Axillary (10/20 1932) BP: 132/65 (10/20 2300) Pulse Rate: 85 (10/20 2300)  Labs: Recent Labs    05/11/21 0520 05/11/21 1242 05/12/21 0018 05/12/21 0353 05/12/21 0842 05/12/21 1305 05/13/21 0416 05/13/21 1151 05/13/21 1614 05/13/21 2157 05/14/21 0002  HGB 14.3  --   --  12.6*  --   --  11.4*  --   --   --   --   HCT 43.2  --   --  36.6*  --   --  35.4*  --   --   --   --   PLT 385  --   --  365  --   --  331  --   --   --   --   APTT  --   --    < >  --  79*   < > 53*  --  99*  --  146*  HEPARINUNFRC  --   --   --  >1.10* >1.10*  --  >1.10*  --   --   --   --   CREATININE 1.37* 1.52*  --  2.04*  --    < > 2.18* 2.03*  --  1.86*  --   TROPONINIHS  --  150*  --   --   --   --   --   --   --   --   --    < > = values in this interval not displayed.     Estimated Creatinine Clearance: 31.9 mL/min (A) (by C-G formula based on SCr of 1.86 mg/dL (H)).   Medical History: Past Medical History:  Diagnosis Date   GERD (gastroesophageal reflux disease)    Hypertension      Assessment: 85 year old male presented with shortness of breath. Patient with PE s/p thrombectomy on 10/14. Patient has been on Eliquis. He was transferred to the ICU 10/18 after rapid response on the floor. He required intubation. Pharmacy consulted for heparin monitoring.  10/20 0416 aPTT 53 HL > 1.1 10/20 1614 aPTT 99 10/21 0002 aPTT 146, supratherapeutic   Goal of Therapy:  Heparin level 0.3-0.7 units/ml once aPTT and heparin level correlate.  aPTT 66-102 seconds Monitor platelets by anticoagulation protocol: Yes   Plan:  Notified RN via [place] to hold  infusion for 1 hour. Will restart heparin at 1150 units/hr after 1 hour hold.  Recheck aPTT in 8 hours after restart.  Heparin level and CBC with AM labs.  Switch to heparin level once aPTT and heparin level correlate.   11/21, PharmD, MBA 05/14/2021 1:17 AM

## 2021-05-14 NOTE — Plan of Care (Signed)

## 2021-05-14 NOTE — Progress Notes (Signed)
Chaplain Maggie offered prayer outside patients room.

## 2021-05-14 NOTE — Progress Notes (Signed)
ANTICOAGULATION CONSULT NOTE  Pharmacy Consult for heparin drip Indication: pulmonary embolus  Allergies  Allergen Reactions   Penicillins     Patient Measurements: Height: 6\' 1"  (185.4 cm) Weight: 75.7 kg (166 lb 14.2 oz) IBW/kg (Calculated) : 79.9 Heparin Dosing Weight: 72 kg  Vital Signs: Temp: 97.3 F (36.3 C) (10/21 0500) Temp Source: Axillary (10/21 0500) BP: 129/61 (10/21 0630) Pulse Rate: 74 (10/21 0630)  Labs: Recent Labs    05/11/21 1242 05/12/21 0018 05/12/21 0353 05/12/21 0842 05/12/21 1305 05/13/21 0416 05/13/21 1151 05/13/21 1614 05/13/21 2157 05/14/21 0002 05/14/21 0511 05/14/21 1104  HGB  --    < > 12.6*  --   --  11.4*  --   --   --   --  10.4*  --   HCT  --   --  36.6*  --   --  35.4*  --   --   --   --  31.6*  --   PLT  --   --  365  --   --  331  --   --   --   --  289  --   APTT  --    < >  --  79*   < > 53*  --  99*  --  146*  --  69*  HEPARINUNFRC  --    < > >1.10* >1.10*  --  >1.10*  --   --   --   --  >1.10*  --   CREATININE 1.52*  --  2.04*  --    < > 2.18* 2.03*  --  1.86*  --  1.64*  --   TROPONINIHS 150*  --   --   --   --   --   --   --   --   --   --   --    < > = values in this interval not displayed.     Estimated Creatinine Clearance: 36.5 mL/min (A) (by C-G formula based on SCr of 1.64 mg/dL (H)).   Medical History: Past Medical History:  Diagnosis Date   GERD (gastroesophageal reflux disease)    Hypertension      Assessment: 85 year old male presented with shortness of breath. Patient with PE s/p thrombectomy on 10/14. Patient has been on Eliquis. He was transferred to the ICU 10/18 after rapid response on the floor. He required intubation. Pharmacy consulted for heparin monitoring.  10/20 0416 aPTT 53 HL > 1.1 10/20 1614 aPTT 99 10/21 0002 aPTT 146, supratherapeutic   Goal of Therapy:  Heparin level 0.3-0.7 units/ml once aPTT and heparin level correlate.  aPTT 66-102 seconds Monitor platelets by  anticoagulation protocol: Yes   Plan:  10/21 1104 aPTT 69, therapeutic Continue heparin drip at 1150 units/hr Recheck aPTT at 1900 to confirm Heparin level and CBC with AM labs  Switch to heparin level once aPTT and heparin level correlate  11/21, PharmD, BCPS Clinical Pharmacist 05/14/2021 11:56 AM
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
## 2021-05-14 NOTE — Progress Notes (Signed)
Patient repeat Potassium down to 5.3. Lokelma dose administered. NP made aware.

## 2021-05-14 NOTE — Progress Notes (Signed)
NAME:  Jonathan Salinas, MRN:  [id number], DOB:  12-May-1936, LOS: 11 ADMISSION DATE:  06/19/2021,   History of Present Illness:  85 y.o. Male admitted with Acute Hypoxic Respiratory Failure in the setting of COVID-19 Pneumonia and Pulmonary Embolism.  Underwent mechanical thrombectomy on 05/10/2021.   Developed acute respiratory distress and hypoxia on 05/11/21 requiring emergent intubation and mechanical ventilation.  Pertinent  Medical History  GERD  HTN  Micro Data:  04/26/21: SARS-CoV-2 PCR>> Positive 06/13/2021: Blood culture x2>> no growth 05/28/2021: MRSA PCR>> negative 05/11/2021: Tracheal aspirate>>     Antimicrobials:  Vancomycin 10/10>>10/11 Cefepime 10/10>>10/12, 10/21>> Unasyn 10/18>>10/21  Significant Hospital Events: Including procedures, antibiotic start and stop dates in addition to other pertinent events   May 25, 2021: Pt admitted to ICU with acute hypoxic respiratory failure secondary to COVID-19 and multifocal pneumonia  05/05/21: Hospitalist took over care; Baricitinib started; CTA Chest with PE (right main pulmonary artery and bilateral subsegmental pulmonary arteries); + Right heart strain 05/06/21: Evaluated by Vascular Surgery, plan for Thrombectomy tomorrow 05/21/2021: Underwent Mechanical Thrombectomy of PE by Vascular Surgery; Venous US + for DVT Left posterior tibial veins 05/08/21: Transitioned to Eliquis 05/09/21: Transferred from Stepdown to Med-Surg (room 234) 05/10/21: Pulmonary consulted due to worsening oxygenation 05/11/21: Developed Acute Respiratory Distress and Hypoxia requiring transfer to ICU and EMERGENT INTUBATION, concern for possible aspiration 10/19 severe hypoxia 10/20 severe hypoxia, unable to wean from vent 10/21 Similar vent requirements  Interim History / Subjective:  Continues with mechanical ventilation Vent requirements modestly improved over last several days  Objective   Blood pressure (!) 102/59, pulse (!) 106, temperature  (!) 97.5 F (36.4 C), temperature source Axillary, resp. rate 19, height 6\' 1"  (1.854 m), weight 75.7 kg, SpO2 (!) 89 %.    Vent Mode: PRVC FiO2 (%):  [50 %] 50 % Set Rate:  [20 bmp] 20 bmp Vt Set:  [500 mL] 500 mL PEEP:  [8 cmH20-10 cmH20] 10 cmH20 Plateau Pressure:  [25 cmH20-26 cmH20] 25 cmH20   Intake/Output Summary (Last 24 hours) at 05/14/2021 1654 Last data filed at 05/14/2021 1600 Gross per 24 hour  Intake 3406.96 ml  Output 2915 ml  Net 491.96 ml   Filed Weights   05/12/21 0406 05/13/21 0442 05/14/21 0500  Weight: 79.1 kg 74.9 kg 75.7 kg    Examination: General: intubated and sedated. Frail appearing HENT: arcus senilis. Pupils equal in size. Oral mucosa moist Lungs: symmetric chest expansion. rhonchorous Cardiovascular: tachycardic.regular. no murmur. Extremities warm. N oedema Abdomen: nondistended Extremities: no cyanosis.  Neuro: does not respond to noxious stimuli GU: foley in place   Assessment & Plan:    #Acute hypoxic respiratory failure secondary to COVID-19 associated ARDS #Healthcare associated pneumonia 2/2 Pseudomonas aeruginosa -- Admitted 25-May-2021. Date intubated:05/11/21.   --Ventilator parameters: Mode PRVC, FiO2 50%, PEEP 10.  Tidal Volume 500 (~6 cc/kg IBW), [place] Pressure 27, Driving Pressure 17, --Therapeutic recs: Lung protective ventilation. Goal tidal volume 4-8cc/kg IBW, pPlat <30. At goal On stress dose steroids, equivalent potency of dexamethasone 6 for COVID Patient is not a candidate for secondary immunomodulation due to concomittant bacterial pneumonia and timeline of illness. Previously on baricitinib this hospital stay and completed remdesevir DC Unasyn. INITIATE cefepime Conservative fluid management. Strict I/O. Diuresis as tolerated. Pulmonary hygiene orders for secretion clearance Goal SpO2 >88% Prone positioning indicated? No Paralytic indicated? No ECMO consult indicated? No    #Sedation plan for a mechanically  ventilated patient --RASS goal: -2 to -3, patient is at goal --Medications  ordered: fentanyl pushes PRN, fentanyl gtt, and propofol gtt  #Acute on Chronic Kidney Injury --creatinine of >2, elevated from baseline creatinine of 1.4 on ICU admission, now downtrending --Patient appears euvolemic on exam --Differential diagnosis:Decreased effective circulating volume due to resolved shock state and with possible ATN --Diagnostic plan: BMP q24hrs   --Therapeutic plan: Continue to support renal perfusion if needed  #Submassive Pulmonary Embolism #LLE DVT --s/p mechanical thrombectomy on 10/14. Continues on systemic AC with Heparin gtt  Best Practice (right click and "Reselect all SmartList Selections" daily)   Diet/type: tubefeeds DVT prophylaxis: systemic heparin GI prophylaxis: H2B Lines: Central line and yes and it is still needed Foley:  Yes, and it is still needed Code Status:  full code Last date of multidisciplinary goals of care discussion [05/14/21]  Labs   CBC: Recent Labs  Lab 05/08/21 0524 05/09/21 0456 05/10/21 0417 05/11/21 0520 05/12/21 0353 05/13/21 0416 05/14/21 0511  WBC 20.3*   < > 22.6* 21.0* 20.2* 15.6* 14.9*  NEUTROABS 18.2*  --   --   --   --   --   --   HGB 11.2*   < > 13.9 14.3 12.6* 11.4* 10.4*  HCT 32.9*   < > 41.3 43.2 36.6* 35.4* 31.6*  MCV 94.0   < > 93.9 94.7 96.1 98.9 96.3  PLT 347   < > 372 385 365 331 289   < > = values in this interval not displayed.    Basic Metabolic Panel: Recent Labs  Lab 05/08/21 0524 05/09/21 0456 05/10/21 0417 05/11/21 0747 05/11/21 1242 05/12/21 1305 05/13/21 0416 05/13/21 1151 05/13/21 2157 05/14/21 0511  NA 135 132*   < >  --    < > 132* 131* 133* 135 136  K 4.7 5.1   < >  --    < > 5.3* 6.2* 6.0* 5.3* 4.9  CL 102 101   < >  --    < > 99 102 103 104 106  CO2 27 24   < >  --    < > 25 22 24 24 25   GLUCOSE 150* 163*   < >  --    < > 105* 150* 161* 121* 153*  BUN 31* 34*   < >  --    < > 74* 71* 81*  74* 68*  CREATININE 1.15 1.25*   < >  --    < > 2.16* 2.18* 2.03* 1.86* 1.64*  CALCIUM 7.9* 8.3*   < >  --    < > 7.6* 8.0* 7.9* 7.9* 8.0*  MG 2.3 2.4  --  2.5*  --   --   --   --   --  2.8*  PHOS  --  4.1  --  5.3*  --   --   --   --   --  3.7   < > = values in this interval not displayed.   GFR: Estimated Creatinine Clearance: 36.5 mL/min (A) (by C-G formula based on SCr of 1.64 mg/dL (H)). Recent Labs  Lab 05/11/21 0520 05/11/21 1242 05/12/21 0353 05/13/21 0416 05/14/21 0511  PROCALCITON  --  0.12 6.24 5.13  --   WBC 21.0*  --  20.2* 15.6* 14.9*    Liver Function Tests: Recent Labs  Lab 05/08/21 0524 05/11/21 0520 05/12/21 0353 05/13/21 0416 05/14/21 0511  AST 33 43* 39 20 34  ALT 44 72* 77* 51* 42  ALKPHOS 58 66 63 57 53  BILITOT 0.8 0.8  1.0 0.7 0.8  PROT 5.9* 7.3 6.7 6.7 6.4*  ALBUMIN 2.0* 2.4* 2.4* 2.3* 2.2*   No results for input(s): LIPASE, AMYLASE in the last 168 hours. No results for input(s): AMMONIA in the last 168 hours.  ABG    Component Value Date/Time   PHART 7.38 05/14/2021 1104   PCO2ART 45 05/14/2021 1104   PO2ART 71 (L) 05/14/2021 1104   HCO3 26.6 05/14/2021 1104   ACIDBASEDEF 3.6 (H) 05/12/2021 0500   O2SAT 93.7 05/14/2021 1104     Coagulation Profile: Recent Labs  Lab 05/09/21 0456  INR 1.6*    Cardiac Enzymes: No results for input(s): CKTOTAL, CKMB, CKMBINDEX, TROPONINI in the last 168 hours.  HbA1C: Hgb A1c MFr Bld  Date/Time Value Ref Range Status  05/04/2021 06:05 AM 5.5 4.8 - 5.6 % Final    Comment:    (NOTE)         Prediabetes: 5.7 - 6.4         Diabetes: >6.4         Glycemic control for adults with diabetes: <7.0     CBG: Recent Labs  Lab 05/13/21 1948 05/13/21 2144 05/14/21 0753 05/14/21 1134 05/14/21 1536  GLUCAP 119* 108* 143* 150* 112*    Review of Systems:   Unable to obtain due to patient condition  Past Medical History:  He,  has a past medical history of GERD (gastroesophageal reflux disease)  and Hypertension.   Surgical History:   Past Surgical History:  Procedure Laterality Date   COLONOSCOPY  2010   cleared for 10 yrs   INNER EAR SURGERY     had a perforated eardrum   PULMONARY THROMBECTOMY Bilateral 05/09/2021   Procedure: PULMONARY THROMBECTOMY;  Surgeon: Renford Dills, MD;  Location: [facility] INVASIVE CV LAB;  Service: Cardiovascular;  Laterality: Bilateral;  Covid (+)   TONSILLECTOMY       Social History:   reports that he has quit smoking. He has never used smokeless tobacco. He reports that he does not drink alcohol and does not use drugs.   Family History:  His family history includes Cancer in his father and mother. There is no history of Prostate cancer, Bladder Cancer, or Kidney cancer.   Allergies Allergies  Allergen Reactions   Penicillins      Home Medications  Prior to Admission medications   Medication Sig Start Date End Date Taking? Authorizing Provider  doxycycline (VIBRAMYCIN) 100 MG capsule Take 1 capsule (100 mg total) by mouth 2 (two) times daily. 04/26/21  Yes Cook, Jayce G, DO  lisinopril (ZESTRIL) 10 MG tablet TAKE 1 TABLET BY MOUTH EVERY DAY 01/18/20  Yes [name], MD     Critical care time: 53 minutes

## 2021-05-15 LAB — CULTURE, RESPIRATORY W GRAM STAIN

## 2021-05-15 LAB — GLUCOSE, CAPILLARY
Glucose-Capillary: 159 mg/dL — ABNORMAL HIGH (ref 70–99)
Glucose-Capillary: 136 mg/dL — ABNORMAL HIGH (ref 70–99)
Glucose-Capillary: 153 mg/dL — ABNORMAL HIGH (ref 70–99)
Glucose-Capillary: 138 mg/dL — ABNORMAL HIGH (ref 70–99)

## 2021-05-15 LAB — COMPREHENSIVE METABOLIC PANEL
ALT: 32 U/L (ref 0–44)
AST: 33 U/L (ref 15–41)
Albumin: 1.9 g/dL — ABNORMAL LOW (ref 3.5–5.0)
Alkaline Phosphatase: 51 U/L (ref 38–126)
Anion gap: 6 (ref 5–15)
BUN: 64 mg/dL — ABNORMAL HIGH (ref 8–23)
CO2: 27 mmol/L (ref 22–32)
Calcium: 7.7 mg/dL — ABNORMAL LOW (ref 8.9–10.3)
Chloride: 108 mmol/L (ref 98–111)
Creatinine, Ser: 1.51 mg/dL — ABNORMAL HIGH (ref 0.61–1.24)
GFR, Estimated: 46 mL/min — ABNORMAL LOW (ref >60)
Glucose, Bld: 162 mg/dL — ABNORMAL HIGH (ref 70–99)
Potassium: 4.4 mmol/L (ref 3.5–5.1)
Sodium: 141 mmol/L (ref 135–145)
Total Bilirubin: 1 mg/dL (ref 0.3–1.2)
Total Protein: 5.9 g/dL — ABNORMAL LOW (ref 6.5–8.1)

## 2021-05-15 LAB — MAGNESIUM: Magnesium: 2.6 mg/dL — ABNORMAL HIGH (ref 1.7–2.4)

## 2021-05-15 LAB — POTASSIUM
Potassium: 4.3 mmol/L (ref 3.5–5.1)
Potassium: 4.2 mmol/L (ref 3.5–5.1)

## 2021-05-15 LAB — C-REACTIVE PROTEIN: CRP: 23.1 mg/dL — ABNORMAL HIGH (ref <1.0)

## 2021-05-15 LAB — TRIGLYCERIDES: Triglycerides: 52 mg/dL (ref <150)

## 2021-05-15 LAB — CBC
HCT: 29.3 % — ABNORMAL LOW (ref 39.0–52.0)
Hemoglobin: 9.5 g/dL — ABNORMAL LOW (ref 13.0–17.0)
MCH: 32 pg (ref 26.0–34.0)
MCHC: 32.4 g/dL (ref 30.0–36.0)
MCV: 98.7 fL (ref 80.0–100.0)
Platelets: 221 10*3/uL (ref 150–400)
RBC: 2.97 MIL/uL — ABNORMAL LOW (ref 4.22–5.81)
RDW: 14.5 % (ref 11.5–15.5)
WBC: 19.1 10*3/uL — ABNORMAL HIGH (ref 4.0–10.5)
nRBC: 0 % (ref 0.0–0.2)

## 2021-05-15 LAB — APTT
aPTT: 99 seconds — ABNORMAL HIGH (ref 24–36)
aPTT: 97 seconds — ABNORMAL HIGH (ref 24–36)

## 2021-05-15 LAB — D-DIMER, QUANTITATIVE: D-Dimer, Quant: 1.49 ug/mL-FEU — ABNORMAL HIGH (ref 0.00–0.50)

## 2021-05-15 LAB — HEPARIN LEVEL (UNFRACTIONATED): Heparin Unfractionated: >1.1 IU/mL — ABNORMAL HIGH (ref 0.30–0.70)

## 2021-05-15 LAB — PHOSPHORUS: Phosphorus: 3.8 mg/dL (ref 2.5–4.6)

## 2021-05-15 MED ORDER — ALBUMIN HUMAN 25 % IV SOLN
25.0000 g | Freq: Four times a day (QID) | INTRAVENOUS | Status: AC
  Administered 2021-05-15 (×2): 25 g via INTRAVENOUS
  Filled 2021-05-15 (×2): qty 100

## 2021-05-15 MED ORDER — INSULIN ASPART 100 UNIT/ML IJ SOLN
0.0000 [IU] | Freq: Three times a day (TID) | INTRAMUSCULAR | Status: DC
  Administered 2021-05-16 (×3): 2 [IU] via SUBCUTANEOUS
  Filled 2021-05-15 (×3): qty 1

## 2021-05-15 MED ORDER — HYDROCORTISONE SOD SUC (PF) 100 MG IJ SOLR
50.0000 mg | Freq: Two times a day (BID) | INTRAMUSCULAR | Status: DC
  Administered 2021-05-15 – 2021-05-17 (×4): 50 mg via INTRAVENOUS
  Filled 2021-05-15 (×4): qty 2

## 2021-05-15 MED ORDER — INSULIN ASPART 100 UNIT/ML IJ SOLN: 0.0000 [IU] | Freq: Every day | INTRAMUSCULAR | Status: DC

## 2021-05-15 NOTE — Progress Notes (Signed)
Patient remains intubated and sedated throughout the shift. Scattered rhonci and excessive thick, tan/brown ETT secretions. Small cuff leak/positional ETT. Vent settings stable throughout the shift. UOP improved, >6103ml since shift change. O2 sats 93 or higher throughout the shift. No BM produced but abd soft and bowel sounds active. Will continue to monitor.

## 2021-05-15 NOTE — Consult Note (Signed)
PHARMACY CONSULT NOTE - FOLLOW UP  Pharmacy Consult for Electrolyte Monitoring and Replacement   Recent Labs: Potassium (mmol/L)  Date Value  05/15/2021 4.4   Magnesium (mg/dL)  Date Value  38/18/2993 2.6 (H)   Calcium (mg/dL)  Date Value  71/69/6789 7.7 (L)   Albumin (g/dL)  Date Value  38/04/1750 1.9 (L)  07/29/2019 4.2   Phosphorus (mg/dL)  Date Value  02/58/5277 3.8   Sodium (mmol/L)  Date Value  05/15/2021 141  07/29/2019 141     Assessment: Patient admitted with shortness of breath. Diagnosed with COVID pneumonia, PE/DVT and hyperkalemia. Patient s/p thrombectomy on 10/14. He has been on Eliquis. Patient required transfer to the ICU 10/18 after rapid response on the floor. He was intubated. Pharmacy consulted to manage electrolytes.  Goal of Therapy:  Electrolytes within normal limits  Plan:  Creatinine now improving No electrolyte replacement indicated Continue to follow along  Angelique Blonder, PharmD Clinical Pharmacist 05/15/2021 2:06 PM

## 2021-05-15 NOTE — Progress Notes (Signed)
NAME:  Jonathan Salinas, MRN:  [id number], DOB:  [birth year], LOS: 12 ADMISSION DATE:  05/01/2021,   History of Present Illness:  85 y.o. Male admitted with Acute Hypoxic Respiratory Failure in the setting of COVID-19 Pneumonia and Pulmonary Embolism.  Underwent mechanical thrombectomy on 05/20/21.   Developed acute respiratory distress and hypoxia on 05/11/21 requiring emergent intubation and mechanical ventilation, presumably in setting of superimposed pseudomonal PNA  Pertinent  Medical History  GERD  HTN  Micro Data:  04/26/21: SARS-CoV-2 PCR>> Positive 05/21/2021: Blood culture x2>> no growth 05/18/2021: MRSA PCR>> negative 05/11/2021: Tracheal aspirate>>abundant pseudomonas, sensitivities pending  Antimicrobials:  Vancomycin 10/10>>10/11 Cefepime 10/10>>10/12, 10/21>> Unasyn 10/18>>10/21  Significant Hospital Events: Including procedures, antibiotic start and stop dates in addition to other pertinent events   05/19/2021: Pt admitted to ICU with acute hypoxic respiratory failure secondary to COVID-19 and multifocal pneumonia  05/05/21: Hospitalist took over care; Baricitinib started; CTA Chest with PE (right main pulmonary artery and bilateral subsegmental pulmonary arteries); + Right heart strain 05/06/21: Evaluated by Vascular Surgery, plan for Thrombectomy tomorrow 2021/05/20: Underwent Mechanical Thrombectomy of PE by Vascular Surgery; Venous US + for DVT Left posterior tibial veins 05/08/21: Transitioned to Eliquis 05/09/21: Transferred from Stepdown to Med-Surg (room 234) 05/10/21: Pulmonary consulted due to worsening oxygenation 05/11/21: Developed Acute Respiratory Distress and Hypoxia requiring transfer to ICU and EMERGENT INTUBATION, concern for possible aspiration 10/19 severe hypoxia 10/20 severe hypoxia, unable to wean from vent 10/21 Similar vent requirements 10/22 modest improvement in Vent requirements. Down to 40%, PEEP10  Interim History / Subjective:  No  acute events overnight Modest improvement in vent requirements. Weaned to 40% and PEEP 10 this AM Pseudomonas noted on sputum culture yesterday, switched unasyn to cefepime  Objective   Blood pressure (!) 107/51, pulse 73, temperature (!) 97.5 F (36.4 C), resp. rate (!) 21, height 6\' 1"  (1.854 m), weight 77.4 kg, SpO2 92 %.    Vent Mode: PRVC FiO2 (%):  [50 %] 50 % Set Rate:  [20 bmp] 20 bmp Vt Set:  [500 mL] 500 mL PEEP:  [10 cmH20] 10 cmH20 Plateau Pressure:  [24 cmH20-25 cmH20] 24 cmH20   Intake/Output Summary (Last 24 hours) at 05/15/2021 0946 Last data filed at 05/15/2021 0900 Gross per 24 hour  Intake 3241.95 ml  Output 2215 ml  Net 1026.95 ml   Filed Weights   05/13/21 0442 05/14/21 0500 05/15/21 0447  Weight: 74.9 kg 75.7 kg 77.4 kg    Examination: General: intubated, sedated, frail appearing HENT: arcus senilis, pupils equal in size. Moist oral mucosa Lungs: symmetric chest expansion. Rhonocours bilaterally Cardiovascular: regular rathe and rhythm. Warm extremities. Cap refill < 2 seconds. No edema Abdomen: nondistended + BS Neuro: withdraws from noxious stimuli GU: foley in place. Clear yellow urine  Assessment & Plan:   #Acute hypoxic respiratory failure secondary to COVID-19 associated ARDS #Healthcare associated pneumonia 2/2 Pseudomonas aeruginosa --Onset of sx: Date of diagnosis: 04/26/21. Admitted 04/29/2021 Date intubated:05/11/21  --Ventilator parameters: Mode PRVC, FiO2 40%, PEEP 10.  Tidal Volume 500 (~6 cc/kg IBW), [place] Pressure 24, Driving Pressure 14 --at this time feel the superimposed pseudomonal pneumonia is likely the primary driver of his decompensation. Slowly weaning ventilator support --Therapeutic recs: Lung protective ventilation. Goal tidal volume 4-8cc/kg IBW, pPlat <30. At goal Previously treated with baricitinib, which was stopped on ICU transfer due to concern for concomitant infection Has been treated with steroids since admission,  on SSS now given prior hemodynamic compromise. Will start weaning hydrocortisone  today Patient is not a candidate for secondary immunomodulation 2/2 pseudomonal PNA Conservative fluid management. Strict I/O. Diuresis as tolerated. Pulmonary hygiene orders for secretion clearance Goal SpO2 >88% Prone positioning indicated? No Paralytic indicated? No ECMO consult indicated? No   #Sedation plan for a mechanically ventilated patient --RASS goal: -2 to -3, patient is at goal --Medications ordered: fentanyl pushes PRN, fentanyl gtt, and propofol gtt  #Septic Shock (resolved) --2/2 pseudomonal PNA vs COVID. Off vasopressors now for 36hrs. Abx as above --medication list reviewed. Stopped amlodipine (unclear why still on board). Continue midodrine and consider weaning in coming days if does well coming off SSS  #Acute on Chronic Kidney Injury --Presented to the ICU  with creatinine of >2, elevated from baseline creatinine of 1.4 --Patient appears euvolemic on exam --Differential diagnosis:  Decreased effective circulating volume due to shock and with possible ATN --Diagnostic plan: BMP q24hrs   --Therapeutic plan: Continue to support renal perfusion  #Submassive Pulmonary Embolism #LLE DVT --s/p mechanical thrombectomy on 10/14. Continues on systemic AC with Heparin gtt  Best Practice (right click and "Reselect all SmartList Selections" daily)   Diet/type: tubefeeds DVT prophylaxis: systemic heparin GI prophylaxis: H2B Lines: Central line and yes and it is still needed. Can re-evaluate tomorrow (10/23) Foley:  Yes, and it is still needed Code Status:  full code Last date of multidisciplinary goals of care discussion [05/14/21]  Labs   CBC: Recent Labs  Lab 05/11/21 0520 05/12/21 0353 05/13/21 0416 05/14/21 0511 05/15/21 0445  WBC 21.0* 20.2* 15.6* 14.9* 19.1*  HGB 14.3 12.6* 11.4* 10.4* 9.5*  HCT 43.2 36.6* 35.4* 31.6* 29.3*  MCV 94.7 96.1 98.9 96.3 98.7  PLT 385 365 331  289 221    Basic Metabolic Panel: Recent Labs  Lab 05/09/21 0456 05/10/21 0417 05/11/21 0747 05/11/21 1242 05/13/21 0416 05/13/21 1151 05/13/21 2157 05/14/21 0511 05/15/21 0445  NA 132*   < >  --    < > 131* 133* 135 136 141  K 5.1   < >  --    < > 6.2* 6.0* 5.3* 4.9 4.4  CL 101   < >  --    < > 102 103 104 106 108  CO2 24   < >  --    < > 22 24 24 25 27   GLUCOSE 163*   < >  --    < > 150* 161* 121* 153* 162*  BUN 34*   < >  --    < > 71* 81* 74* 68* 64*  CREATININE 1.25*   < >  --    < > 2.18* 2.03* 1.86* 1.64* 1.51*  CALCIUM 8.3*   < >  --    < > 8.0* 7.9* 7.9* 8.0* 7.7*  MG 2.4  --  2.5*  --   --   --   --  2.8* 2.6*  PHOS 4.1  --  5.3*  --   --   --   --  3.7 3.8   < > = values in this interval not displayed.   GFR: Estimated Creatinine Clearance: 40.6 mL/min (A) (by C-G formula based on SCr of 1.51 mg/dL (H)). Recent Labs  Lab 05/11/21 1242 05/12/21 0353 05/13/21 0416 05/14/21 0511 05/15/21 0445  PROCALCITON 0.12 6.24 5.13  --   --   WBC  --  20.2* 15.6* 14.9* 19.1*    Liver Function Tests: Recent Labs  Lab 05/11/21 0520 05/12/21 0353 05/13/21 0416 05/14/21 0511 05/15/21 0445  AST 43* 39 20 34 33  ALT 72* 77* 51* 42 32  ALKPHOS 66 63 57 53 51  BILITOT 0.8 1.0 0.7 0.8 1.0  PROT 7.3 6.7 6.7 6.4* 5.9*  ALBUMIN 2.4* 2.4* 2.3* 2.2* 1.9*   No results for input(s): LIPASE, AMYLASE in the last 168 hours. No results for input(s): AMMONIA in the last 168 hours.  ABG    Component Value Date/Time   PHART 7.38 05/14/2021 1104   PCO2ART 45 05/14/2021 1104   PO2ART 71 (L) 05/14/2021 1104   HCO3 26.6 05/14/2021 1104   ACIDBASEDEF 3.6 (H) 05/12/2021 0500   O2SAT 93.7 05/14/2021 1104     Coagulation Profile: Recent Labs  Lab 05/09/21 0456  INR 1.6*    Cardiac Enzymes: No results for input(s): CKTOTAL, CKMB, CKMBINDEX, TROPONINI in the last 168 hours.  HbA1C: Hgb A1c MFr Bld  Date/Time Value Ref Range Status  05/04/2021 06:05 AM 5.5 4.8 - 5.6 %  Final    Comment:    (NOTE)         Prediabetes: 5.7 - 6.4         Diabetes: >6.4         Glycemic control for adults with diabetes: <7.0     CBG: Recent Labs  Lab 05/13/21 2144 05/14/21 0753 05/14/21 1134 05/14/21 1536 05/15/21 0806  GLUCAP 108* 143* 150* 112* 159*    Review of Systems:   Unable to obtain 2/2 patient condition  Past Medical History:  He,  has a past medical history of GERD (gastroesophageal reflux disease) and Hypertension.   Surgical History:   Past Surgical History:  Procedure Laterality Date   COLONOSCOPY  2010   cleared for 10 yrs   INNER EAR SURGERY     had a perforated eardrum   PULMONARY THROMBECTOMY Bilateral 05/14/2021   Procedure: PULMONARY THROMBECTOMY;  Surgeon: Renford Dills, MD;  Location: [facility] INVASIVE CV LAB;  Service: Cardiovascular;  Laterality: Bilateral;  Covid (+)   TONSILLECTOMY       Social History:   reports that he has quit smoking. He has never used smokeless tobacco. He reports that he does not drink alcohol and does not use drugs.   Family History:  His family history includes Cancer in his father and mother. There is no history of Prostate cancer, Bladder Cancer, or Kidney cancer.   Allergies Allergies  Allergen Reactions   Penicillins      Home Medications  Prior to Admission medications   Medication Sig Start Date End Date Taking? Authorizing Provider  doxycycline (VIBRAMYCIN) 100 MG capsule Take 1 capsule (100 mg total) by mouth 2 (two) times daily. 04/26/21  Yes Cook, Jayce G, DO  lisinopril (ZESTRIL) 10 MG tablet TAKE 1 TABLET BY MOUTH EVERY DAY 01/18/20  Yes [name], MD     Critical care time: 49 minutes

## 2021-05-15 NOTE — Progress Notes (Signed)
ANTICOAGULATION CONSULT NOTE  Pharmacy Consult for heparin drip Indication: pulmonary embolus  Allergies  Allergen Reactions   Penicillins     Patient Measurements: Height: 6\' 1"  (185.4 cm) Weight: 77.4 kg (170 lb 10.2 oz) IBW/kg (Calculated) : 79.9 Heparin Dosing Weight: 72 kg  Vital Signs: Temp: 97.7 F (36.5 C) (10/22 0530) Temp Source: Axillary (10/21 2336) BP: 129/59 (10/22 0530) Pulse Rate: 83 (10/22 0530)  Labs: Recent Labs    05/13/21 0416 05/13/21 1151 05/13/21 2157 05/14/21 0002 05/14/21 0511 05/14/21 1104 05/14/21 1918 05/15/21 0445  HGB 11.4*  --   --   --  10.4*  --   --  9.5*  HCT 35.4*  --   --   --  31.6*  --   --  29.3*  PLT 331  --   --   --  289  --   --  221  APTT 53*   < >  --    < >  --  69* 87* 99*  HEPARINUNFRC >1.10*  --   --   --  >1.10*  --   --  >1.10*  CREATININE 2.18*   < > 1.86*  --  1.64*  --   --  1.51*   < > = values in this interval not displayed.     Estimated Creatinine Clearance: 40.6 mL/min (A) (by C-G formula based on SCr of 1.51 mg/dL (H)).   Medical History: Past Medical History:  Diagnosis Date   GERD (gastroesophageal reflux disease)    Hypertension      Assessment: 85 year old male presented with shortness of breath. Patient with PE s/p thrombectomy on 10/14. Patient has been on Eliquis. He was transferred to the ICU 10/18 after rapid response on the floor. He required intubation. Pharmacy consulted for heparin monitoring.  10/20 0416 aPTT 53; HL > 1.1   1250 > 1350 un/hr 10/20 1614 aPTT 99 10/21 0002 aPTT 146, supratherapeutic; 1350 > 1150 un/hr 10/21 1104 aPTT 69, therapeutic x1 1150 un/hr 10/21 1104 aPTT 87, therapeutic x2 1150un/hr 10/22 0445 aPTT 99, HL > 1.10  Goal of Therapy:  Heparin level 0.3-0.7 units/ml once aPTT and heparin level correlate.  aPTT 66-102 seconds Monitor platelets by anticoagulation protocol: Yes   Plan:  aPTT remains therapeutic, but still trending up Decrease heparin drip  to 1100 units/hr Recheck aPTT in 8 hrs to confirm  Heparin level and CBC on AM labs  Switch to heparin level alone once aPTT and heparin level correlate  11/22, PharmD, [facility] 05/15/2021 6:02 AM

## 2021-05-15 NOTE — Progress Notes (Signed)
ANTICOAGULATION CONSULT NOTE  Pharmacy Consult for heparin drip Indication: pulmonary embolus  Allergies  Allergen Reactions   Penicillins     Patient Measurements: Height: 6\' 1"  (185.4 cm) Weight: 77.4 kg (170 lb 10.2 oz) IBW/kg (Calculated) : 79.9 Heparin Dosing Weight: 72 kg  Vital Signs: Temp: 97.5 F (36.4 C) (10/22 1400) Temp Source: Esophageal (10/22 1400) BP: 123/54 (10/22 1400) Pulse Rate: 80 (10/22 1400)  Labs: Recent Labs    05/13/21 0416 05/13/21 1151 05/13/21 2157 05/14/21 0002 05/14/21 0511 05/14/21 1104 05/14/21 1918 05/15/21 0445 05/15/21 1340  HGB 11.4*  --   --   --  10.4*  --   --  9.5*  --   HCT 35.4*  --   --   --  31.6*  --   --  29.3*  --   PLT 331  --   --   --  289  --   --  221  --   APTT 53*   < >  --    < >  --    < > 87* 99* 97*  HEPARINUNFRC >1.10*  --   --   --  >1.10*  --   --  >1.10*  --   CREATININE 2.18*   < > 1.86*  --  1.64*  --   --  1.51*  --    < > = values in this interval not displayed.     Estimated Creatinine Clearance: 40.6 mL/min (A) (by C-G formula based on SCr of 1.51 mg/dL (H)).   Medical History: Past Medical History:  Diagnosis Date   GERD (gastroesophageal reflux disease)    Hypertension      Assessment: 85 year old male presented with shortness of breath. Patient with PE s/p thrombectomy on 10/14. Patient has been on Eliquis. He was transferred to the ICU 10/18 after rapid response on the floor. He required intubation. Pharmacy consulted for heparin monitoring.  10/20 0416 aPTT 53; HL > 1.1   1250 > 1350 un/hr 10/20 1614 aPTT 99 10/21 0002 aPTT 146, supratherapeutic; 1350 > 1150 un/hr 10/21 1104 aPTT 69, therapeutic x1 1150 un/hr 10/21 1104 aPTT 87, therapeutic x2 1150un/hr 10/22 0445 aPTT 99, HL > 1.10  Goal of Therapy:  Heparin level 0.3-0.7 units/ml once aPTT and heparin level correlate.  aPTT 66-102 seconds Monitor platelets by anticoagulation protocol: Yes   Plan:  aPTT remains  therapeutic x3, but still trending up Decrease heparin drip to 1100 units/hr F/u aPTT/Heparin level and CBC on AM labs  Switch to heparin level alone once aPTT and heparin level correlate  11/22 PharmD Clinical Pharmacist 05/15/2021

## 2021-05-16 LAB — BASIC METABOLIC PANEL
Anion gap: 5 (ref 5–15)
BUN: 72 mg/dL — ABNORMAL HIGH (ref 8–23)
CO2: 29 mmol/L (ref 22–32)
Calcium: 8 mg/dL — ABNORMAL LOW (ref 8.9–10.3)
Chloride: 110 mmol/L (ref 98–111)
Creatinine, Ser: 1.61 mg/dL — ABNORMAL HIGH (ref 0.61–1.24)
GFR, Estimated: 42 mL/min — ABNORMAL LOW (ref >60)
Glucose, Bld: 145 mg/dL — ABNORMAL HIGH (ref 70–99)
Potassium: 4.2 mmol/L (ref 3.5–5.1)
Sodium: 144 mmol/L (ref 135–145)

## 2021-05-16 LAB — CBC
HCT: 26.4 % — ABNORMAL LOW (ref 39.0–52.0)
Hemoglobin: 8.5 g/dL — ABNORMAL LOW (ref 13.0–17.0)
MCH: 32 pg (ref 26.0–34.0)
MCHC: 32.2 g/dL (ref 30.0–36.0)
MCV: 99.2 fL (ref 80.0–100.0)
Platelets: 186 10*3/uL (ref 150–400)
RBC: 2.66 MIL/uL — ABNORMAL LOW (ref 4.22–5.81)
RDW: 14.6 % (ref 11.5–15.5)
WBC: 12.3 10*3/uL — ABNORMAL HIGH (ref 4.0–10.5)
nRBC: 0 % (ref 0.0–0.2)

## 2021-05-16 LAB — GLUCOSE, CAPILLARY
Glucose-Capillary: 122 mg/dL — ABNORMAL HIGH (ref 70–99)
Glucose-Capillary: 133 mg/dL — ABNORMAL HIGH (ref 70–99)
Glucose-Capillary: 129 mg/dL — ABNORMAL HIGH (ref 70–99)
Glucose-Capillary: 153 mg/dL — ABNORMAL HIGH (ref 70–99)
Glucose-Capillary: 114 mg/dL — ABNORMAL HIGH (ref 70–99)

## 2021-05-16 LAB — APTT
aPTT: 62 seconds — ABNORMAL HIGH (ref 24–36)
aPTT: 67 seconds — ABNORMAL HIGH (ref 24–36)
aPTT: 65 seconds — ABNORMAL HIGH (ref 24–36)

## 2021-05-16 LAB — PHOSPHORUS: Phosphorus: 2.8 mg/dL (ref 2.5–4.6)

## 2021-05-16 LAB — MAGNESIUM: Magnesium: 2.7 mg/dL — ABNORMAL HIGH (ref 1.7–2.4)

## 2021-05-16 LAB — HEPARIN LEVEL (UNFRACTIONATED): Heparin Unfractionated: 0.97 IU/mL — ABNORMAL HIGH (ref 0.30–0.70)

## 2021-05-16 MED ORDER — INSULIN ASPART 100 UNIT/ML IJ SOLN
0.0000 [IU] | INTRAMUSCULAR | Status: DC
Start: 2021-05-17 — End: 2021-05-24
  Administered 2021-05-17 – 2021-05-21 (×20): 2 [IU] via SUBCUTANEOUS
  Administered 2021-05-22: 3 [IU] via SUBCUTANEOUS
  Administered 2021-05-22: 2 [IU] via SUBCUTANEOUS
  Administered 2021-05-22: 5 [IU] via SUBCUTANEOUS
  Administered 2021-05-22 (×3): 3 [IU] via SUBCUTANEOUS
  Administered 2021-05-23 (×2): 2 [IU] via SUBCUTANEOUS
  Administered 2021-05-23: 3 [IU] via SUBCUTANEOUS
  Administered 2021-05-23 – 2021-05-24 (×3): 2 [IU] via SUBCUTANEOUS
  Filled 2021-05-16 (×33): qty 1

## 2021-05-16 MED ORDER — FUROSEMIDE 10 MG/ML IJ SOLN
80.0000 mg | Freq: Once | INTRAMUSCULAR | Status: AC
  Administered 2021-05-16: 80 mg via INTRAVENOUS
  Filled 2021-05-16: qty 8

## 2021-05-16 NOTE — Progress Notes (Signed)
ANTICOAGULATION CONSULT NOTE  Pharmacy Consult for heparin drip Indication: pulmonary embolus  Allergies  Allergen Reactions   Penicillins     Patient Measurements: Height: 6\' 1"  (185.4 cm) Weight: 77.4 kg (170 lb 10.2 oz) IBW/kg (Calculated) : 79.9 Heparin Dosing Weight: 72 kg  Vital Signs: Temp: 100.2 F (37.9 C) (10/23 0500) Temp Source: Esophageal (10/23 0400) BP: 120/52 (10/23 0500) Pulse Rate: 76 (10/23 0500)  Labs: Recent Labs    05/14/21 0511 05/14/21 1104 05/15/21 0445 05/15/21 1340 05/16/21 0431  HGB 10.4*  --  9.5*  --  8.5*  HCT 31.6*  --  29.3*  --  26.4*  PLT 289  --  221  --  186  APTT  --    < > 99* 97* 62*  HEPARINUNFRC >1.10*  --  >1.10*  --  0.97*  CREATININE 1.64*  --  1.51*  --  1.61*   < > = values in this interval not displayed.     Estimated Creatinine Clearance: 38.1 mL/min (A) (by C-G formula based on SCr of 1.61 mg/dL (H)).   Medical History: Past Medical History:  Diagnosis Date   GERD (gastroesophageal reflux disease)    Hypertension      Assessment: 85 year old male presented with shortness of breath. Patient with PE s/p thrombectomy on 10/14. Patient has been on Eliquis. He was transferred to the ICU 10/18 after rapid response on the floor. He required intubation. Pharmacy consulted for heparin monitoring.  10/20 0416 aPTT 53; HL > 1.1   1250 > 1350 un/hr 10/20 1614 aPTT 99 10/21 0002 aPTT 146, supratherapeutic; 1350 > 1150 un/hr 10/21 1104 aPTT 69, therapeutic x1 1150 un/hr 10/21 1104 aPTT 87, therapeutic x2 1150un/hr 10/22 0445 aPTT 99, HL > 1.10 10/23 0431 aPTT 62, HL 0.97  Goal of Therapy:  Heparin level 0.3-0.7 units/ml once aPTT and heparin level correlate.  aPTT 66-102 seconds Monitor platelets by anticoagulation protocol: Yes   Plan:  aPTT slightly subtherapeutic Increase heparin drip to 1200 units/hr Recheck aPTT in 8 hours after rate change F/u Heparin level and CBC on AM labs  Switch to heparin level  alone once aPTT and heparin level correlate  11/23, PharmD, MBA 05/16/2021 6:01 AM

## 2021-05-16 NOTE — Progress Notes (Signed)
ANTICOAGULATION CONSULT NOTE  Pharmacy Consult for heparin drip Indication: pulmonary embolus  Allergies  Allergen Reactions   Penicillins     Patient Measurements: Height: 6\' 1"  (185.4 cm) Weight: 76 kg (167 lb 8.8 oz) IBW/kg (Calculated) : 79.9 Heparin Dosing Weight: 72 kg  Vital Signs: Temp: 98.2 F (36.8 C) (10/23 1200) Temp Source: Esophageal (10/23 1100) BP: 133/65 (10/23 1200) Pulse Rate: 85 (10/23 1200)  Labs: Recent Labs    05/14/21 0511 05/14/21 1104 05/15/21 0445 05/15/21 1340 05/16/21 0431 05/16/21 1400  HGB 10.4*  --  9.5*  --  8.5*  --   HCT 31.6*  --  29.3*  --  26.4*  --   PLT 289  --  221  --  186  --   APTT  --    < > 99* 97* 62* 67*  HEPARINUNFRC >1.10*  --  >1.10*  --  0.97*  --   CREATININE 1.64*  --  1.51*  --  1.61*  --    < > = values in this interval not displayed.     Estimated Creatinine Clearance: 37.4 mL/min (A) (by C-G formula based on SCr of 1.61 mg/dL (H)).   Medical History: Past Medical History:  Diagnosis Date   GERD (gastroesophageal reflux disease)    Hypertension      Assessment: 85 year old male presented with shortness of breath. Patient with PE s/p thrombectomy on 10/14. Patient has been on Eliquis. He was transferred to the ICU 10/18 after rapid response on the floor. He required intubation. Pharmacy consulted for heparin monitoring.  10/20 0416 aPTT 53; HL > 1.1   1250 > 1350 un/hr 10/20 1614 aPTT 99 10/21 0002 aPTT 146, supratherapeutic; 1350 > 1150 un/hr 10/21 1104 aPTT 69, therapeutic x1 1150 un/hr 10/21 1104 aPTT 87, therapeutic x2 1150un/hr 10/22 0445 aPTT 99, HL > 1.10 10/23 0431 aPTT 62, HL 0.97 Subthera, inc to 1200 u/hr  Goal of Therapy:  Heparin level 0.3-0.7 units/ml once aPTT and heparin level correlate.  aPTT 66-102 seconds Monitor platelets by anticoagulation protocol: Yes   Plan:  10/23 1400 aPTT = 67  therapeutic Continue heparin drip at 1200 units/hr check confirmatory aPTT in 8  hours F/u Heparin level and CBC on AM labs  Switch to heparin level alone once aPTT and heparin level correlate  11/23 PharmD Clinical Pharmacist 05/16/2021

## 2021-05-16 NOTE — Progress Notes (Signed)
ANTICOAGULATION CONSULT NOTE  Pharmacy Consult for heparin drip Indication: pulmonary embolus  Allergies  Allergen Reactions   Penicillins     Patient Measurements: Height: 6\' 1"  (185.4 cm) Weight: 76 kg (167 lb 8.8 oz) IBW/kg (Calculated) : 79.9 Heparin Dosing Weight: 72 kg  Vital Signs: Temp: 97 F (36.1 C) (10/23 2100) Temp Source: Esophageal (10/23 1630) BP: 125/62 (10/23 2100) Pulse Rate: 71 (10/23 2100)  Labs: Recent Labs    05/14/21 0511 05/14/21 1104 05/15/21 0445 05/15/21 1340 05/16/21 0431 05/16/21 1400 05/16/21 2150  HGB 10.4*  --  9.5*  --  8.5*  --   --   HCT 31.6*  --  29.3*  --  26.4*  --   --   PLT 289  --  221  --  186  --   --   APTT  --    < > 99*   < > 62* 67* 65*  HEPARINUNFRC >1.10*  --  >1.10*  --  0.97*  --   --   CREATININE 1.64*  --  1.51*  --  1.61*  --   --    < > = values in this interval not displayed.     Estimated Creatinine Clearance: 37.4 mL/min (A) (by C-G formula based on SCr of 1.61 mg/dL (H)).   Medical History: Past Medical History:  Diagnosis Date   GERD (gastroesophageal reflux disease)    Hypertension      Assessment: 85 year old male presented with shortness of breath. Patient with PE s/p thrombectomy on 10/14. Patient has been on Eliquis. He was transferred to the ICU 10/18 after rapid response on the floor. He required intubation. Pharmacy consulted for heparin monitoring.  10/20 0416 aPTT 53; HL > 1.1   1250 > 1350 un/hr 10/20 1614 aPTT 99 10/21 0002 aPTT 146, supratherapeutic; 1350 > 1150 un/hr 10/21 1104 aPTT 69, therapeutic x1 1150 un/hr 10/21 1104 aPTT 87, therapeutic x2 1150un/hr 10/22 0445 aPTT 99, HL > 1.10 10/23 0431 aPTT 62, HL 0.97 Subthera, inc to 1200 u/hr 10/23 2150 aPTT 65, slightly subtherapeutic  Goal of Therapy:  Heparin level 0.3-0.7 units/ml once aPTT and heparin level correlate.  aPTT 66-102 seconds Monitor platelets by anticoagulation protocol: Yes   Plan:  Increase heparin drip  to 1300 units/hr Will recheck aPTT with AM labs. F/u Heparin level and CBC on AM labs  Switch to heparin level alone once aPTT and heparin level correlate  2151, PharmD, [facility] 05/16/2021 10:58 PM

## 2021-05-16 NOTE — Progress Notes (Signed)
RN called the on call infection prevention staff. Per infection prevention pt is okay to go off contact / airborne precautions this shift if critical care MD is okay with it. Verbal order received to remove contact precautions for covid. WUA performed and pt had to be re-sedated due to stacking breaths, bucking the vent, desating to 88-89%, accessory muscle use, and systolic BP becoming >200. Pt was unable to follow commands at any point during WUA but did appear to track the nurse. Pt turned q2h, DTI assessed on sacrum. Barrier cream applied and sacral foam changed. Per MD okay to discontinue midodrine. HTN when agitated or awake and maintaining adequate BP when sedated at this time.Tolerating tube feeds. Adequate urine output. No BM this shift, bowel regimen continued, active bowel sounds, soft abdomen. Will continue to monitor.

## 2021-05-16 NOTE — Progress Notes (Signed)
NAME:  Jonathan Salinas, MRN:  [id number], DOB:  Jun 29, 1936, LOS: 13 ADMISSION DATE:  09-May-2021,   History of Present Illness:  85 y.o. Male admitted with Acute Hypoxic Respiratory Failure in the setting of COVID-19 Pneumonia and Pulmonary Embolism.  Underwent mechanical thrombectomy on 05/16/2021.   Developed acute respiratory distress and hypoxia on 05/11/21 requiring emergent intubation and mechanical ventilation, presumably in setting of superimposed pseudomonal PNA  Pertinent  Medical History  GERD  HTN  Micro Data:  04/26/21: SARS-CoV-2 PCR>> Positive 05-09-21: Blood culture x2>> no growth 2021-05-09: MRSA PCR>> negative 05/11/2021: Tracheal aspirate>>abundant pseudomonas, sensitivities pending  Antimicrobials:  Vancomycin 10/10>>10/11 Cefepime 10/10>>10/12, 10/21>> Unasyn 10/18>>10/21  Significant Hospital Events: Including procedures, antibiotic start and stop dates in addition to other pertinent events   2021/05/09: Pt admitted to ICU with acute hypoxic respiratory failure secondary to COVID-19 and multifocal pneumonia  05/05/21: Hospitalist took over care; Baricitinib started; CTA Chest with PE (right main pulmonary artery and bilateral subsegmental pulmonary arteries); + Right heart strain 05/06/21: Evaluated by Vascular Surgery, plan for Thrombectomy tomorrow 05/10/2021: Underwent Mechanical Thrombectomy of PE by Vascular Surgery; Venous US + for DVT Left posterior tibial veins 05/08/21: Transitioned to Eliquis 05/09/21: Transferred from Stepdown to Med-Surg (room 234) 05/10/21: Pulmonary consulted due to worsening oxygenation 05/11/21: Developed Acute Respiratory Distress and Hypoxia requiring transfer to ICU and EMERGENT INTUBATION, concern for possible aspiration 10/19 severe hypoxia 10/20 severe hypoxia, unable to wean from vent 10/21 Similar vent requirements 10/22 modest improvement in Vent requirements. Down to 40%, PEEP10 05/16/21- patient failed SBT with severe  aggitation and abnormal vital signs.   Interim History / Subjective:  No acute events overnight Modest improvement in vent requirements. Weaned to 40% and PEEP 10 this AM Pseudomonas noted on sputum culture yesterday, switched unasyn to cefepime  Objective   Blood pressure (!) 141/59, pulse 90, temperature 98.4 F (36.9 C), resp. rate 20, height 6\' 1"  (1.854 m), weight 76 kg, SpO2 91 %.    Vent Mode: PRVC FiO2 (%):  [40 %] 40 % Set Rate:  [20 bmp] 20 bmp Vt Set:  [500 mL] 500 mL PEEP:  [10 cmH20] 10 cmH20 Plateau Pressure:  [25 cmH20-26 cmH20] 26 cmH20   Intake/Output Summary (Last 24 hours) at 05/16/2021 1059 Last data filed at 05/16/2021 1011 Gross per 24 hour  Intake 2766.77 ml  Output 3010 ml  Net -243.23 ml    Filed Weights   05/14/21 0500 05/15/21 0447 05/16/21 0600  Weight: 75.7 kg 77.4 kg 76 kg    Examination: General: intubated, sedated, frail appearing HENT: arcus senilis, pupils equal in size. Moist oral mucosa Lungs: symmetric chest expansion. Rhonocours bilaterally Cardiovascular: regular rathe and rhythm. Warm extremities. Cap refill < 2 seconds. No edema Abdomen: nondistended + BS Neuro: withdraws from noxious stimuli GU: foley in place. Clear yellow urine  Assessment & Plan:   #Acute hypoxic respiratory failure secondary to COVID-19 associated ARDS #Healthcare associated pneumonia 2/2 Pseudomonas aeruginosa --Onset of sx: Date of diagnosis: 04/26/21. Admitted 2021/05/09 Date intubated:05/11/21  --Ventilator parameters: Mode PRVC, FiO2 40%, PEEP 10.  Tidal Volume 500 (~6 cc/kg IBW), [place] Pressure 24, D[place] Pressure 14 --at this time feel the superimposed pseudomonal pneumonia is likely the primary driver of his decompensation. Slowly weaning ventilator support --Therapeutic recs: Lung protective ventilation. Goal tidal volume 4-8cc/kg IBW, pPlat <30. At goal Previously treated with baricitinib, which was stopped on ICU transfer due to concern for  concomitant infection Has been treated with steroids since admission,  on SSS now given prior hemodynamic compromise. Will start weaning hydrocortisone today Patient is not a candidate for secondary immunomodulation 2/2 pseudomonal PNA Conservative fluid management. Strict I/O. Diuresis as tolerated. Pulmonary hygiene orders for secretion clearance Goal SpO2 >88% Prone positioning indicated? No Paralytic indicated? No ECMO consult indicated? No   #Sedation plan for a mechanically ventilated patient --RASS goal: -2 to -3, patient is at goal --Medications ordered: fentanyl pushes PRN, fentanyl gtt, and propofol gtt  #Septic Shock (resolved) --2/2 pseudomonal PNA vs COVID. Off vasopressors now for 36hrs. Abx as above --medication list reviewed. Stopped amlodipine (unclear why still on board). Continue midodrine and consider weaning in coming days if does well coming off SSS  #Acute on Chronic Kidney Injury --Presented to the ICU  with creatinine of >2, elevated from baseline creatinine of 1.4 --Patient appears euvolemic on exam --Differential diagnosis:  Decreased effective circulating volume due to shock and with possible ATN --Diagnostic plan: BMP q24hrs   --Therapeutic plan: Continue to support renal perfusion  #Submassive Pulmonary Embolism #LLE DVT --s/p mechanical thrombectomy on 10/14. Continues on systemic AC with Heparin gtt  Best Practice (right click and "Reselect all SmartList Selections" daily)   Diet/type: tubefeeds DVT prophylaxis: systemic heparin GI prophylaxis: H2B Lines: Central line and yes and it is still needed. Can re-evaluate tomorrow (10/23) Foley:  Yes, and it is still needed Code Status:  full code Last date of multidisciplinary goals of care discussion [05/14/21]  Labs   CBC: Recent Labs  Lab 05/12/21 0353 05/13/21 0416 05/14/21 0511 05/15/21 0445 05/16/21 0431  WBC 20.2* 15.6* 14.9* 19.1* 12.3*  HGB 12.6* 11.4* 10.4* 9.5* 8.5*  HCT 36.6*  35.4* 31.6* 29.3* 26.4*  MCV 96.1 98.9 96.3 98.7 99.2  PLT 365 331 289 221 186     Basic Metabolic Panel: Recent Labs  Lab 05/11/21 0747 05/11/21 1242 05/13/21 1151 05/13/21 2157 05/14/21 0511 05/15/21 0420 05/15/21 0445 05/15/21 1723 05/16/21 0431  NA  --    < > 133* 135 136  --  141  --  144  K  --    < > 6.0* 5.3* 4.9 4.3 4.4 4.2 4.2  CL  --    < > 103 104 106  --  108  --  110  CO2  --    < > 24 24 25   --  27  --  29  GLUCOSE  --    < > 161* 121* 153*  --  162*  --  145*  BUN  --    < > 81* 74* 68*  --  64*  --  72*  CREATININE  --    < > 2.03* 1.86* 1.64*  --  1.51*  --  1.61*  CALCIUM  --    < > 7.9* 7.9* 8.0*  --  7.7*  --  8.0*  MG 2.5*  --   --   --  2.8*  --  2.6*  --  2.7*  PHOS 5.3*  --   --   --  3.7  --  3.8  --  2.8   < > = values in this interval not displayed.    GFR: Estimated Creatinine Clearance: 37.4 mL/min (A) (by C-G formula based on SCr of 1.61 mg/dL (H)). Recent Labs  Lab 05/11/21 1242 05/12/21 0353 05/13/21 0416 05/14/21 0511 05/15/21 0445 05/16/21 0431  PROCALCITON 0.12 6.24 5.13  --   --   --   WBC  --  20.2* 15.6* 14.9*  19.1* 12.3*     Liver Function Tests: Recent Labs  Lab 05/11/21 0520 05/12/21 0353 05/13/21 0416 05/14/21 0511 05/15/21 0445  AST 43* 39 20 34 33  ALT 72* 77* 51* 42 32  ALKPHOS 66 63 57 53 51  BILITOT 0.8 1.0 0.7 0.8 1.0  PROT 7.3 6.7 6.7 6.4* 5.9*  ALBUMIN 2.4* 2.4* 2.3* 2.2* 1.9*    No results for input(s): LIPASE, AMYLASE in the last 168 hours. No results for input(s): AMMONIA in the last 168 hours.  ABG    Component Value Date/Time   PHART 7.38 05/14/2021 1104   PCO2ART 45 05/14/2021 1104   PO2ART 71 (L) 05/14/2021 1104   HCO3 26.6 05/14/2021 1104   ACIDBASEDEF 3.6 (H) 05/12/2021 0500   O2SAT 93.7 05/14/2021 1104      Coagulation Profile: No results for input(s): INR, PROTIME in the last 168 hours.   Cardiac Enzymes: No results for input(s): CKTOTAL, CKMB, CKMBINDEX, TROPONINI in the  last 168 hours.  HbA1C: Hgb A1c MFr Bld  Date/Time Value Ref Range Status  05/04/2021 06:05 AM 5.5 4.8 - 5.6 % Final    Comment:    (NOTE)         Prediabetes: 5.7 - 6.4         Diabetes: >6.4         Glycemic control for adults with diabetes: <7.0     CBG: Recent Labs  Lab 05/15/21 0806 05/15/21 1135 05/15/21 1557 05/15/21 2015 05/16/21 0747  GLUCAP 159* 136* 153* 138* 122*     Review of Systems:   Unable to obtain 2/2 patient condition  Past Medical History:  He,  has a past medical history of GERD (gastroesophageal reflux disease) and Hypertension.   Surgical History:   Past Surgical History:  Procedure Laterality Date   COLONOSCOPY  2010   cleared for 10 yrs   INNER EAR SURGERY     had a perforated eardrum   PULMONARY THROMBECTOMY Bilateral 05/10/2021   Procedure: PULMONARY THROMBECTOMY;  Surgeon: Renford Dills, MD;  Location: [facility] INVASIVE CV LAB;  Service: Cardiovascular;  Laterality: Bilateral;  Covid (+)   TONSILLECTOMY       Social History:   reports that he has quit smoking. He has never used smokeless tobacco. He reports that he does not drink alcohol and does not use drugs.   Family History:  His family history includes Cancer in his father and mother. There is no history of Prostate cancer, Bladder Cancer, or Kidney cancer.   Allergies Allergies  Allergen Reactions   Penicillins      Home Medications  Prior to Admission medications   Medication Sig Start Date End Date Taking? Authorizing Provider  doxycycline (VIBRAMYCIN) 100 MG capsule Take 1 capsule (100 mg total) by mouth 2 (two) times daily. 04/26/21  Yes Cook, Jayce G, DO  lisinopril (ZESTRIL) 10 MG tablet TAKE 1 TABLET BY MOUTH EVERY DAY 01/18/20  Yes [name], MD     Critical care time: 49 minutes

## 2021-05-16 NOTE — Consult Note (Signed)
PHARMACY CONSULT NOTE - FOLLOW UP  Pharmacy Consult for Electrolyte Monitoring and Replacement   Recent Labs: Potassium (mmol/L)  Date Value  05/16/2021 4.2   Magnesium (mg/dL)  Date Value  46/80/3212 2.7 (H)   Calcium (mg/dL)  Date Value  24/82/5003 8.0 (L)   Albumin (g/dL)  Date Value  70/48/8891 1.9 (L)  07/29/2019 4.2   Phosphorus (mg/dL)  Date Value  69/45/0388 2.8   Sodium (mmol/L)  Date Value  05/16/2021 144  07/29/2019 141     Assessment: Patient admitted with shortness of breath. Diagnosed with COVID pneumonia, PE/DVT and hyperkalemia. Patient s/p thrombectomy on 10/14. He has been on Eliquis. Patient required transfer to the ICU 10/18 after rapid response on the floor. He was intubated. Pharmacy consulted to manage electrolytes.  Goal of Therapy:  Electrolytes within normal limits  Plan:  No electrolyte replacement indicated Continue to follow along  Angelique Blonder, PharmD Clinical Pharmacist 05/16/2021 12:43 PM

## 2021-05-17 ENCOUNTER — Inpatient Hospital Stay: Payer: Medicare Other

## 2021-05-17 DIAGNOSIS — Z7189 Other specified counseling: Secondary | ICD-10-CM | POA: Diagnosis not present

## 2021-05-17 DIAGNOSIS — Z978 Presence of other specified devices: Secondary | ICD-10-CM

## 2021-05-17 LAB — BASIC METABOLIC PANEL
Anion gap: 14 (ref 5–15)
BUN: 73 mg/dL — ABNORMAL HIGH (ref 8–23)
CO2: 27 mmol/L (ref 22–32)
Calcium: 7.9 mg/dL — ABNORMAL LOW (ref 8.9–10.3)
Chloride: 104 mmol/L (ref 98–111)
Creatinine, Ser: 1.3 mg/dL — ABNORMAL HIGH (ref 0.61–1.24)
GFR, Estimated: 55 mL/min — ABNORMAL LOW (ref >60)
Glucose, Bld: 161 mg/dL — ABNORMAL HIGH (ref 70–99)
Potassium: 4.1 mmol/L (ref 3.5–5.1)
Sodium: 145 mmol/L (ref 135–145)

## 2021-05-17 LAB — BLOOD GAS, ARTERIAL
Acid-Base Excess: 7.3 mmol/L — ABNORMAL HIGH (ref 0.0–2.0)
Bicarbonate: 32 mmol/L — ABNORMAL HIGH (ref 20.0–28.0)
FIO2: 0.35
MECHVT: 500 mL
O2 Saturation: 96.6 %
PEEP: 8 cmH2O
Patient temperature: 37
RATE: 20 resp/min
pCO2 arterial: 45 mmHg (ref 32.0–48.0)
pH, Arterial: 7.46 — ABNORMAL HIGH (ref 7.350–7.450)
pO2, Arterial: 82 mmHg — ABNORMAL LOW (ref 83.0–108.0)

## 2021-05-17 LAB — GLUCOSE, CAPILLARY
Glucose-Capillary: 141 mg/dL — ABNORMAL HIGH (ref 70–99)
Glucose-Capillary: 141 mg/dL — ABNORMAL HIGH (ref 70–99)
Glucose-Capillary: 115 mg/dL — ABNORMAL HIGH (ref 70–99)
Glucose-Capillary: 126 mg/dL — ABNORMAL HIGH (ref 70–99)
Glucose-Capillary: 116 mg/dL — ABNORMAL HIGH (ref 70–99)

## 2021-05-17 LAB — CBC
HCT: 27.4 % — ABNORMAL LOW (ref 39.0–52.0)
Hemoglobin: 9.1 g/dL — ABNORMAL LOW (ref 13.0–17.0)
MCH: 33.1 pg (ref 26.0–34.0)
MCHC: 33.2 g/dL (ref 30.0–36.0)
MCV: 99.6 fL (ref 80.0–100.0)
Platelets: 153 10*3/uL (ref 150–400)
RBC: 2.75 MIL/uL — ABNORMAL LOW (ref 4.22–5.81)
RDW: 14.3 % (ref 11.5–15.5)
WBC: 11.7 10*3/uL — ABNORMAL HIGH (ref 4.0–10.5)
nRBC: 0 % (ref 0.0–0.2)

## 2021-05-17 LAB — HEPARIN LEVEL (UNFRACTIONATED): Heparin Unfractionated: 0.92 IU/mL — ABNORMAL HIGH (ref 0.30–0.70)

## 2021-05-17 LAB — MAGNESIUM: Magnesium: 2.4 mg/dL (ref 1.7–2.4)

## 2021-05-17 LAB — APTT
aPTT: 78 seconds — ABNORMAL HIGH (ref 24–36)
aPTT: 86 seconds — ABNORMAL HIGH (ref 24–36)

## 2021-05-17 LAB — PHOSPHORUS: Phosphorus: 3.2 mg/dL (ref 2.5–4.6)

## 2021-05-17 MED ORDER — JUVEN PO PACK
1.0000 | PACK | Freq: Two times a day (BID) | ORAL | Status: DC
  Administered 2021-05-18 – 2021-05-24 (×10): 1

## 2021-05-17 MED ORDER — VITAL AF 1.2 CAL PO LIQD
1000.0000 mL | ORAL | Status: DC
  Administered 2021-05-17 – 2021-05-19 (×2): 1000 mL

## 2021-05-17 MED ORDER — BISACODYL 10 MG RE SUPP
10.0000 mg | Freq: Once | RECTAL | Status: AC
  Administered 2021-05-17: 10 mg via RECTAL
  Filled 2021-05-17: qty 1

## 2021-05-17 MED ORDER — IPRATROPIUM-ALBUTEROL 0.5-2.5 (3) MG/3ML IN SOLN
3.0000 mL | RESPIRATORY_TRACT | Status: DC
  Administered 2021-05-17 – 2021-05-18 (×6): 3 mL via RESPIRATORY_TRACT
  Filled 2021-05-17 (×6): qty 3

## 2021-05-17 MED ORDER — PROSOURCE TF PO LIQD
45.0000 mL | Freq: Every day | ORAL | Status: DC
  Administered 2021-05-18 – 2021-05-19 (×2): 45 mL
  Filled 2021-05-17 (×2): qty 45

## 2021-05-17 NOTE — Progress Notes (Signed)
ANTICOAGULATION CONSULT NOTE  Pharmacy Consult for heparin drip Indication: pulmonary embolus  Allergies  Allergen Reactions   Penicillins     Patient Measurements: Height: 6\' 1"  (185.4 cm) Weight: 81.6 kg (179 lb 14.3 oz) IBW/kg (Calculated) : 79.9 Heparin Dosing Weight: 72 kg  Vital Signs: Temp: 96.8 F (36 C) (10/24 0600) Temp Source: Esophageal (10/24 0400) BP: 123/65 (10/24 0600) Pulse Rate: 67 (10/24 0600)  Labs: Recent Labs    05/15/21 0445 05/15/21 1340 05/16/21 0431 05/16/21 1400 05/16/21 2150 05/17/21 0457  HGB 9.5*  --  8.5*  --   --  9.1*  HCT 29.3*  --  26.4*  --   --  27.4*  PLT 221  --  186  --   --  153  APTT 99*   < > 62* 67* 65* 78*  HEPARINUNFRC >1.10*  --  0.97*  --   --  0.92*  CREATININE 1.51*  --  1.61*  --   --  1.30*   < > = values in this interval not displayed.     Estimated Creatinine Clearance: 48.7 mL/min (A) (by C-G formula based on SCr of 1.3 mg/dL (H)).   Medical History: Past Medical History:  Diagnosis Date   GERD (gastroesophageal reflux disease)    Hypertension      Assessment: 85 year old male presented with shortness of breath. Patient with PE s/p thrombectomy on 10/14. Patient has been on Eliquis. He was transferred to the ICU 10/18 after rapid response on the floor. He required intubation. Pharmacy consulted for heparin monitoring.  10/20 0416 aPTT 53; HL > 1.1   1250 > 1350 un/hr 10/20 1614 aPTT 99 10/21 0002 aPTT 146, supratherapeutic; 1350 > 1150 un/hr 10/21 1104 aPTT 69, therapeutic x1 1150 un/hr 10/21 1104 aPTT 87, therapeutic x2 1150un/hr 10/22 0445 aPTT 99, HL > 1.10 10/23 0431 aPTT 62, HL 0.97 Subthera, inc to 1200 u/hr 10/23 2150 aPTT 65, slightly subtherapeutic 10/24 0457 aPTT 78, therapeutic, HL 0.92  Goal of Therapy:  Heparin level 0.3-0.7 units/ml once aPTT and heparin level correlate.  aPTT 66-102 seconds Monitor platelets by anticoagulation protocol: Yes   Plan:  Continue heparin drip at  1300 units/hr Will recheck aPTT in 8 hours to confirm F/u Heparin level and CBC on AM labs  Switch to heparin level alone once aPTT and heparin level correlate  11/24, PharmD, [facility] 05/17/2021 6:15 AM

## 2021-05-17 NOTE — Progress Notes (Signed)
NAME:  Jonathan Salinas, MRN:  [id number], DOB:  12-08-1935, LOS: 14 ADMISSION DATE:  04/24/2021,   History of Present Illness:  85 y.o. Male admitted with Acute Hypoxic Respiratory Failure in the setting of COVID-19 Pneumonia and Pulmonary Embolism.  Underwent mechanical pulmonary thrombectomy on 05-21-21.   Developed acute respiratory distress and hypoxia on 05/11/21 requiring emergent intubation and mechanical ventilation, presumably in setting of superimposed pseudomonal PNA.  Pertinent  Medical History  GERD  HTN CKD  Micro Data:  04/26/21: SARS-CoV-2 PCR>> Positive 05/21/2021: Blood culture x2>> no growth 05/05/2021: MRSA PCR>> negative 05/11/2021: Tracheal aspirate>>abundant pseudomonas, sensitivities pending  Antimicrobials:  Vancomycin 10/10>>10/11 Cefepime 10/10>>10/12, 10/21>>10/24 Unasyn 10/18>>10/21  Significant Hospital Events: Including procedures, antibiotic start and stop dates in addition to other pertinent events   05/02/2021: Pt admitted to ICU with acute hypoxic respiratory failure secondary to COVID-19 and multifocal pneumonia  05/05/21: Hospitalist took over care; Baricitinib started; CTA Chest with PE (right main pulmonary artery and bilateral subsegmental pulmonary arteries); + Right heart strain 05/06/21: Evaluated by Vascular Surgery, plan for Thrombectomy tomorrow 2021-05-21: Underwent Mechanical Thrombectomy of PE by Vascular Surgery; Venous US + for DVT Left posterior tibial veins 05/08/21: Transitioned to Eliquis 05/09/21: Transferred from Stepdown to Med-Surg (room 234) 05/10/21: Pulmonary consulted due to worsening oxygenation 05/11/21: Developed Acute Respiratory Distress and Hypoxia requiring transfer to ICU and EMERGENT INTUBATION, concern for possible aspiration 10/19 severe hypoxia 10/20 severe hypoxia, unable to wean from vent 10/21 Similar vent requirements 10/22 modest improvement in Vent requirements. Down to 40%, PEEP10 10/23- patient  failed SBT with severe aggitation and abnormal vital signs.  10/24 - failed SBT, tachycardia, hypertension. Slight improvement in ventilation requirements. O2 35%, PEEP 5.   Interim History / Subjective:  No acute events overnight. Failed SBT, copious secretions..  Objective   Blood pressure (!) 141/69, pulse 83, temperature 98.4 F (36.9 C), resp. rate (!) 24, height 6\' 1"  (1.854 m), weight 81.6 kg, SpO2 96 %.    Vent Mode: PRVC FiO2 (%):  [35 %] 35 % Set Rate:  [20 bmp] 20 bmp Vt Set:  [500 mL] 500 mL PEEP:  [5 cmH20-10 cmH20] 5 cmH20 Plateau Pressure:  [24 cmH20] 24 cmH20   Intake/Output Summary (Last 24 hours) at 05/17/2021 1320 Last data filed at 05/17/2021 1200 Gross per 24 hour  Intake 745.74 ml  Output 1759 ml  Net -1013.26 ml   Filed Weights   05/15/21 0447 05/16/21 0600 05/17/21 0457  Weight: 77.4 kg 76 kg 81.6 kg   ROS unable to attain due to patient being intubated and sedated   Examination: General appearance: 85 y.o., male, intubated, sedated   Eyes: anicteric sclerae, tracking HENT: NCAT; dry MM Lungs: very coarse bl, equal chest rise, thick secretions present in-line CV: RRR, no MRGs  Abdomen: Soft, non-tender; non-distended, BS present  Extremities: No peripheral edema, radial and DP pulses present bilaterally  Skin: Normal temperature, turgor and texture; no rash Neuro: Follows commands to wiggle toes bl  10/19 resp cx pansensitive PsA   Assessment & Plan:   #Acute hypoxic respiratory failure secondary to COVID-19 associated ARDS, HAP #HAP due to Pseudomonas aeruginosa Date of covid diagnosis: 04/26/21. Admitted 05/04/2021 Date intubated:05/11/21. Course c/b PsA HAP on cx 10/19. - full vent support - RASS -1 - add CPT, bronchodilators - continue cefepime for 7d course  - net negative fluid balance  #Septic Shock (resolved) -- Pseudomonal PNA being treated with cefepime. Off vasopressors. -  #AKI on CKD --trend  electrolytes --ensure net  negative fluid balance as above  #Submassive Pulmonary Embolism #LLE DVT - s/p mechanical pulmonary thrombectomy on 10/14. Continues on systemic AC with Heparin gtt  Best Practice (right click and "Reselect all SmartList Selections" daily)   Diet/type: tubefeeds DVT prophylaxis: systemic heparin GI prophylaxis: H2B Lines: Central line and yes and it is still needed. Discontinue arterial line.  Foley:  Yes, and it is still needed Code Status:  full code Last date of multidisciplinary goals of care discussion [05/14/21]  Critical care time: 30 minutes  Laroy Apple Pulmonary/Critical Care

## 2021-05-17 NOTE — Consult Note (Signed)
PHARMACY CONSULT NOTE  Pharmacy Consult for Electrolyte Monitoring and Replacement   Recent Labs: Potassium (mmol/L)  Date Value  05/17/2021 4.1   Magnesium (mg/dL)  Date Value  85/46/2703 2.4   Calcium (mg/dL)  Date Value  50/03/3817 7.9 (L)   Albumin (g/dL)  Date Value  29/93/7169 1.9 (L)  07/29/2019 4.2   Phosphorus (mg/dL)  Date Value  67/89/3810 3.2   Sodium (mmol/L)  Date Value  05/17/2021 145  07/29/2019 141    Assessment: Patient admitted with shortness of breath. Diagnosed with COVID pneumonia, PE/DVT and hyperkalemia. Patient s/p thrombectomy on 10/14. He has been on Eliquis. Patient required transfer to the ICU 10/18 after rapid response on the floor. He was intubated. Pharmacy consulted to manage electrolytes.  Goal of Therapy:  Electrolytes within normal limits  Plan:  --No electrolyte replacement indicated at this time --Will continue to follow along  [name] 05/17/2021 8:25 AM

## 2021-05-17 NOTE — Progress Notes (Signed)
ANTICOAGULATION CONSULT NOTE  Pharmacy Consult for Heparin drip Indication: pulmonary embolus  Patient Measurements: Heparin Dosing Weight: 72 kg  Labs: Recent Labs    05/15/21 0445 05/15/21 1340 05/16/21 0431 05/16/21 1400 05/16/21 2150 05/17/21 0457 05/17/21 1341  HGB 9.5*  --  8.5*  --   --  9.1*  --   HCT 29.3*  --  26.4*  --   --  27.4*  --   PLT 221  --  186  --   --  153  --   APTT 99*   < > 62*   < > 65* 78* 86*  HEPARINUNFRC >1.10*  --  0.97*  --   --  0.92*  --   CREATININE 1.51*  --  1.61*  --   --  1.30*  --    < > = values in this interval not displayed.     Estimated Creatinine Clearance: 48.7 mL/min (A) (by C-G formula based on SCr of 1.3 mg/dL (H)).   Medical History: Past Medical History:  Diagnosis Date   GERD (gastroesophageal reflux disease)    Hypertension      Assessment: 85 year old male presented with shortness of breath. Patient with PE s/p thrombectomy on 10/14. Patient has been on Eliquis. He was transferred to the ICU 10/18 after rapid response on the floor. He required intubation. Pharmacy consulted for heparin monitoring.  10/20 0416 aPTT 53; HL > 1.1   1250 > 1350 un/hr 10/20 1614 aPTT 99 10/21 0002 aPTT 146, supratherapeutic; 1350 > 1150 un/hr 10/21 1104 aPTT 69, therapeutic x1 1150 un/hr 10/21 1104 aPTT 87, therapeutic x2 1150un/hr 10/22 0445 aPTT 99, HL > 1.10 10/23 0431 aPTT 62, HL 0.97 Subthera, inc to 1200 u/hr 10/23 2150 aPTT 65, slightly subtherapeutic 10/24 0457 aPTT 78, therapeutic, HL 0.92 10/24 1341 aPTT 86, therapeutic  Goal of Therapy:  Heparin level 0.3-0.7 units/ml once aPTT and heparin level correlate.  aPTT 66-102 seconds Monitor platelets by anticoagulation protocol: Yes   Plan:  Continue heparin drip at 1300 units/hr Recheck aPTT & HL tomorrow AM Switch to heparin level alone once aPTT and heparin level correlate Daily CBC per protocol  [name]  05/17/2021 2:19 PM

## 2021-05-17 NOTE — Progress Notes (Signed)
Uneventful day. No visitation from son. Sacral unstageable wound with skin still intact. Patient became extremely tachycardic and hypertensive with CPT. Bolused with Fentanyl to calm patient.

## 2021-05-17 NOTE — Progress Notes (Signed)
Nutrition Follow Up Note   DOCUMENTATION CODES:   Severe malnutrition in context of social or environmental circumstances  INTERVENTION:   Change to Vital 1.2 @65ml /hr + ProSource TF 1m daily via tube   Propofol: 13.03 ml/hr- provides 344kcal/day   Free water flushes 362mq4 hours to maintain tube patency   Regimen provides 2256kcal/day, 128g/day protein and 144520may free water   Juven Fruit Punch BID via tube, each serving provides 95kcal and 2.5g of protein (amino acids glutamine and arginine)  NUTRITION DIAGNOSIS:   Severe Malnutrition related to social / environmental circumstances (advanced age) as evidenced by severe fat depletion, severe muscle depletion.  GOAL:   Provide needs based on ASPEN/SCCM guidelines -met   MONITOR:   Vent status, Labs, Weight trends, TF tolerance, Skin, I & O's  ASSESSMENT:   [age] male with h/o HTN and GERD who is admitted with COVID PNA, b/l PE s/p thrombectomy 10/14, AKI and possible aspiration requiring intubation and ventilation.  Pt sedated and ventilated. OGT in place. Pt tolerating tube feeds at goal rate; will adjust regimen in setting of COVID guidelines. Refeed labs stable. Pt with new DTI to his sacrum. No BM since 10/16; NP notified. Per chart, pt appears fairly weight stable since admission. Palliative care following for GOC; family deciding about trach.   Medications reviewed and include: colace, insulin, MVI, miralax, pepcid, cefepime, heparin, propofol   Labs reviewed: K 4.1 wnl, BUN 73(H), creat 1.30(H), P 3.2 wnl, Mg 2.4 wnl Wbc- 11.7(H), Hgb 9.1(L), Hct 27.4(L) Cbgs- 141, 141, 115 x 24 hrs   Patient is currently intubated on ventilator support MV: 11.2 L/min Temp (24hrs), Avg:97.3 F (36.3 C), Min:96.6 F (35.9 C), Max:98.4 F (36.9 C)  Propofol: 13.03 ml/hr- provides 344kcal/day   MAP- >28m32m UOP- 2650ml26met Order:   Diet Order             Diet NPO time specified  Diet effective now                   EDUCATION NEEDS:   No education needs have been identified at this time  Skin:  Skin Assessment: Reviewed RN Assessment (DTI sacrum)  Last BM:  10/16- type 5  Height:   Ht Readings from Last 1 Encounters:  05/11/2021 6' 1"  (1.854 m)    Weight:   Wt Readings from Last 1 Encounters:  05/17/21 81.6 kg    Ideal Body Weight:  83.6 kg  BMI:  Body mass index is 23.73 kg/m.  Estimated Nutritional Needs:   Kcal:  2200-2500kcal/day  Protein:  110-125g/day  Fluid:  2.0-2.3L/day  CaseyKoleen DistanceRD, LDN Please refer to [facility] and/or RD on-call/weekend/after hours pager

## 2021-05-17 NOTE — Progress Notes (Signed)
Daily Progress Note   Patient Name: Jonathan Salinas       Date: 05/17/2021 DOB: 1935/08/04  Age: 85 y.o. MRN#: [id number] Attending Physician: Maryjane Hurter, MD Primary Care Physician: Juline Patch, MD Admit Date: 05/14/2021  Reason for Consultation/Follow-up: Establishing goals of care  Subjective: Patient is resting in bed on ventilator. No family at bedside. Per CCM, patient did not tolerate SBT attempt. Spoke with son via phone. He has been kept updated by CCM. He accurately articulates his father's status.   He states he wants to give his father "a chance, to a certain point, to improve. It's up to him what he does." He discussed multiple scenarios. He does not want his father to suffer. Discussed determining acceptable QOL. Discussed code status which he will consider.    He requests to talk again tomorrow. He does state he has a sister, the patient's daughter. He states she has been estranged for over 20 years from both he and his father. He states he is unsure if anyone in the family has contact information for her but will see.    Length of Stay: 14  Current Medications: Scheduled Meds:   chlorhexidine gluconate (MEDLINE KIT)  15 mL Mouth Rinse BID   Chlorhexidine Gluconate Cloth  6 each Topical Q0600   docusate  100 mg Per Tube BID   famotidine  20 mg Per Tube BID   free water  30 mL Per Tube Q4H   Gerhardt's butt cream   Topical BID   insulin aspart  0-15 Units Subcutaneous Q4H   ipratropium-albuterol  3 mL Nebulization Q4H   mouth rinse  15 mL Mouth Rinse 10 times per day   multivitamin with minerals  1 tablet Per Tube Daily   polyethylene glycol  17 g Per Tube Daily   sodium chloride flush  3 mL Intravenous Q12H    Continuous Infusions:  sodium chloride      ceFEPime (MAXIPIME) IV 2 g (05/17/21 0949)   feeding supplement (VITAL HIGH PROTEIN) 1,000 mL (05/17/21 0210)   fentaNYL infusion INTRAVENOUS 200 mcg/hr (05/17/21 1154)   heparin 1,300 Units/hr (05/16/21 2309)   propofol (DIPRIVAN) infusion 30 mcg/kg/min (05/17/21 1008)    PRN Meds: sodium chloride, acetaminophen, albuterol, docusate, fentaNYL, guaiFENesin-dextromethorphan, labetalol, ondansetron (ZOFRAN) IV, polyethylene glycol, sodium chloride flush  Physical Exam Constitutional:      Comments: On the ventilator.             Vital Signs: BP (!) 141/69   Pulse 83   Temp 98.4 F (36.9 C)   Resp (!) 24   Ht 6' 1"  (1.854 m)   Wt 81.6 kg   SpO2 96%   BMI 23.73 kg/m  SpO2: SpO2: 96 % O2 Device: O2 Device: Ventilator O2 Flow Rate: O2 Flow Rate (L/min): 35 L/min  Intake/output summary:  Intake/Output Summary (Last 24 hours) at 05/17/2021 1301 Last data filed at 05/17/2021 1200 Gross per 24 hour  Intake 745.74 ml  Output 1759 ml  Net -1013.26 ml   LBM: Last BM Date: 05/09/21 Baseline Weight: Weight: 80.7 kg Most recent weight: Weight: 81.6 kg        Patient Active Problem List   Diagnosis Date Noted   Protein-calorie malnutrition, severe 05/12/2021   COVID-19    DVT (deep venous thrombosis) (HCC) 05/02/2021   Pulmonary embolism ([place]) 05/05/2021   Pneumonia due to COVID-19 virus 05/02/2021   Essential hypertension 06/25/2018    Palliative Care Assessment & Plan    Recommendations/Plan: -- Son considering care moving forward; wants to give his father a chance. Would like to see how he does with SBT tomorrow and talk further.  -- Son states patient has a daughter who is estranged to both he and the patient. He will speak with family to see if anyone has her contact information.    Code Status:    Code Status Orders  (From admission, onward)           Start     Ordered   05/14/2021 1756  Full code  Continuous        04/29/2021 1756           Code  Status History     This patient has a current code status but no historical code status.       Prognosis: Poor overall  Care plan was discussed with CCM  Thank you for allowing the Palliative Medicine Team to assist in the care of this patient.       Total Time 35 min Prolonged Time Billed  no       Greater than 50%  of this time was spent counseling and coordinating care related to the above assessment and plan.  Asencion Gowda, NP  Please contact Palliative Medicine Team phone at [phone] for questions and concerns.

## 2021-05-18 ENCOUNTER — Inpatient Hospital Stay: Payer: Medicare Other

## 2021-05-18 DIAGNOSIS — E43 Unspecified severe protein-calorie malnutrition: Secondary | ICD-10-CM | POA: Diagnosis not present

## 2021-05-18 DIAGNOSIS — I2699 Other pulmonary embolism without acute cor pulmonale: Secondary | ICD-10-CM | POA: Diagnosis not present

## 2021-05-18 DIAGNOSIS — U071 COVID-19: Secondary | ICD-10-CM | POA: Diagnosis not present

## 2021-05-18 DIAGNOSIS — J9601 Acute respiratory failure with hypoxia: Secondary | ICD-10-CM | POA: Diagnosis not present

## 2021-05-18 DIAGNOSIS — A419 Sepsis, unspecified organism: Secondary | ICD-10-CM

## 2021-05-18 DIAGNOSIS — Z7189 Other specified counseling: Secondary | ICD-10-CM | POA: Diagnosis not present

## 2021-05-18 LAB — GLUCOSE, CAPILLARY
Glucose-Capillary: 119 mg/dL — ABNORMAL HIGH (ref 70–99)
Glucose-Capillary: 119 mg/dL — ABNORMAL HIGH (ref 70–99)
Glucose-Capillary: 121 mg/dL — ABNORMAL HIGH (ref 70–99)
Glucose-Capillary: 129 mg/dL — ABNORMAL HIGH (ref 70–99)
Glucose-Capillary: 137 mg/dL — ABNORMAL HIGH (ref 70–99)
Glucose-Capillary: 139 mg/dL — ABNORMAL HIGH (ref 70–99)
Glucose-Capillary: 141 mg/dL — ABNORMAL HIGH (ref 70–99)

## 2021-05-18 LAB — TRIGLYCERIDES: Triglycerides: 123 mg/dL (ref <150)

## 2021-05-18 LAB — PHOSPHORUS: Phosphorus: 3.2 mg/dL (ref 2.5–4.6)

## 2021-05-18 LAB — BASIC METABOLIC PANEL
Anion gap: 9 (ref 5–15)
BUN: 64 mg/dL — ABNORMAL HIGH (ref 8–23)
CO2: 26 mmol/L (ref 22–32)
Calcium: 8.3 mg/dL — ABNORMAL LOW (ref 8.9–10.3)
Chloride: 109 mmol/L (ref 98–111)
Creatinine, Ser: 1.3 mg/dL — ABNORMAL HIGH (ref 0.61–1.24)
GFR, Estimated: 55 mL/min — ABNORMAL LOW (ref >60)
Glucose, Bld: 125 mg/dL — ABNORMAL HIGH (ref 70–99)
Potassium: 4.3 mmol/L (ref 3.5–5.1)
Sodium: 144 mmol/L (ref 135–145)

## 2021-05-18 LAB — CBC
HCT: 33.1 % — ABNORMAL LOW (ref 39.0–52.0)
Hemoglobin: 10.7 g/dL — ABNORMAL LOW (ref 13.0–17.0)
MCH: 32.7 pg (ref 26.0–34.0)
MCHC: 32.3 g/dL (ref 30.0–36.0)
MCV: 101.2 fL — ABNORMAL HIGH (ref 80.0–100.0)
Platelets: 104 10*3/uL — ABNORMAL LOW (ref 150–400)
RBC: 3.27 MIL/uL — ABNORMAL LOW (ref 4.22–5.81)
RDW: 14.6 % (ref 11.5–15.5)
WBC: 15.4 10*3/uL — ABNORMAL HIGH (ref 4.0–10.5)
nRBC: 0 % (ref 0.0–0.2)

## 2021-05-18 LAB — PROCALCITONIN: Procalcitonin: 0.95 ng/mL

## 2021-05-18 LAB — APTT: aPTT: 65 seconds — ABNORMAL HIGH (ref 24–36)

## 2021-05-18 LAB — HEPARIN LEVEL (UNFRACTIONATED): Heparin Unfractionated: 0.84 IU/mL — ABNORMAL HIGH (ref 0.30–0.70)

## 2021-05-18 LAB — MAGNESIUM: Magnesium: 2.4 mg/dL (ref 1.7–2.4)

## 2021-05-18 MED ORDER — IPRATROPIUM-ALBUTEROL 0.5-2.5 (3) MG/3ML IN SOLN
3.0000 mL | Freq: Four times a day (QID) | RESPIRATORY_TRACT | Status: DC
  Administered 2021-05-18 – 2021-05-19 (×5): 3 mL via RESPIRATORY_TRACT
  Filled 2021-05-18 (×5): qty 3

## 2021-05-18 MED ORDER — APIXABAN 5 MG PO TABS
5.0000 mg | ORAL_TABLET | Freq: Two times a day (BID) | ORAL | Status: DC
  Administered 2021-05-18 – 2021-05-19 (×4): 5 mg
  Filled 2021-05-18 (×4): qty 1

## 2021-05-18 MED ORDER — FUROSEMIDE 10 MG/ML IJ SOLN
40.0000 mg | Freq: Once | INTRAMUSCULAR | Status: AC
  Administered 2021-05-18: 40 mg via INTRAVENOUS
  Filled 2021-05-18: qty 4

## 2021-05-18 MED ORDER — HEPARIN BOLUS VIA INFUSION
1050.0000 [IU] | Freq: Once | INTRAVENOUS | Status: AC
  Administered 2021-05-18: 1050 [IU] via INTRAVENOUS
  Filled 2021-05-18: qty 1050

## 2021-05-18 NOTE — Progress Notes (Signed)
1200 Patients' ventilator placed in spontaneous mode per Dr. Merrily Pew. Sedation quickly weaned. 1245 Patient not tolerating wean. Heart rate flipped into A Fib at rate of 140s. Respiratory rate increased to 50s. Patient distressed and flailing in the bed. Sedation resumed at previous rate. Heparin drip discontinued per previous order.

## 2021-05-18 NOTE — Care Plan (Signed)
GOALS OF CARE DISCUSSION   The Clinical status was relayed to patient's son and daughter inlaw at bedside  in detail.   Updated and notified of patients medical condition.     Patient remains unresponsive and will not open eyes to command.   Patient is having a weak cough and struggling to remove secretions.   Patient with increased WOB and using accessory muscles to breathe Explained to family course of therapy and the modalities  Evidence of kidney failure   PATIENT was made DNR per family request, they would like to see if he starts improving. Explained option for tracheostomy but per patient's wishes he would not want trach.     Family are satisfied with Plan of action and management. All questions answered    Cheri Fowler MD [facility] See Amion for pager If no response to pager, please call [phone] until 7pm After 7pm, Please call E-link [phone]

## 2021-05-18 NOTE — Progress Notes (Addendum)
Daily Progress Note   Patient Name: Jonathan Salinas       Date: 05/18/2021 DOB: 29-Dec-1935  Age: 85 y.o. MRN#: [id number] Attending Physician: Jacky Kindle, MD Primary Care Physician: Juline Patch, MD Admit Date: 05/19/2021  Reason for Consultation/Follow-up: Establishing goals of care  Subjective: Patient is resting in bed on ventilator. Per staff, he has been unable to tolerate decrease in sedation for SBT. Also, per conversation it does not appear he could successfully sustain with removal from the ventilator.   Spoke with son Lanny Hurst. Lanny Hurst understands his father's status. We discussed acceptable QOL in the future. He states he will be in later this evening to speak with CCM, as he is considering shifting to comfort care.    He provided his sister Tammy's phone number [phone]) [phone]. Per Lanny Hurst, she has been estranged from both he and the patient for over 20 years.    Spoke with Tammy. She states she has been updated, and she does accurately articulate patient's status. We discussed his diagnoses, prognosis, GOC, EOL wishes disposition and options.  Created space and opportunity for patient  to explore thoughts and feelings regarding current medical information.   A detailed discussion was had today regarding advanced directives.  Concepts specific to code status, artifical feeding and hydration, IV antibiotics and rehospitalization were discussed.  The difference between an aggressive medical intervention path and a comfort care path was discussed.  Values and goals of care important to patient and family were attempted to be elicited.  Discussed limitations of medical interventions to prolong quality of life in some situations and discussed the concept of human mortality.  Tammy  states she does not want her father to suffer. She discusses her estrangement of over 12 years from her father and brother. She states she will not stand in the way of Keith's decisions whatever they are. She states she would like for him to be shifted to comfort care- but again states the decisions are up to [place].  CCM staff made aware.       Length of Stay: 15  Current Medications: Scheduled Meds:   apixaban  5 mg Per Tube BID   chlorhexidine gluconate (MEDLINE KIT)  15 mL Mouth Rinse BID   Chlorhexidine Gluconate Cloth  6 each Topical Q0600   docusate  100  mg Per Tube BID   famotidine  20 mg Per Tube BID   feeding supplement (PROSource TF)  45 mL Per Tube Daily   free water  30 mL Per Tube Q4H   Gerhardt's butt cream   Topical BID   insulin aspart  0-15 Units Subcutaneous Q4H   ipratropium-albuterol  3 mL Nebulization Q6H   mouth rinse  15 mL Mouth Rinse 10 times per day   nutrition supplement (JUVEN)  1 packet Per Tube BID BM   polyethylene glycol  17 g Per Tube Daily   sodium chloride flush  3 mL Intravenous Q12H    Continuous Infusions:  sodium chloride     ceFEPime (MAXIPIME) IV 2 g (05/18/21 0909)   feeding supplement (VITAL AF 1.2 CAL) 65 mL/hr at 05/18/21 0600   fentaNYL infusion INTRAVENOUS 200 mcg/hr (05/18/21 0911)   propofol (DIPRIVAN) infusion 40 mcg/kg/min (05/18/21 1018)    PRN Meds: sodium chloride, acetaminophen, albuterol, docusate, fentaNYL, guaiFENesin-dextromethorphan, labetalol, ondansetron (ZOFRAN) IV, polyethylene glycol, sodium chloride flush  Physical Exam Constitutional:      Comments: Eyes closed. On ventilator.             Vital Signs: BP (!) 108/58   Pulse 98   Temp 98.8 F (37.1 C)   Resp 15   Ht 6' 1"  (1.854 m)   Wt 76.7 kg   SpO2 96%   BMI 22.31 kg/m  SpO2: SpO2: 96 % O2 Device: O2 Device: Ventilator O2 Flow Rate: O2 Flow Rate (L/min): 35 L/min  Intake/output summary:  Intake/Output Summary (Last 24 hours) at 05/18/2021  1433 Last data filed at 05/18/2021 0920 Gross per 24 hour  Intake 3051.93 ml  Output 1875 ml  Net 1176.93 ml   LBM: Last BM Date: 05/18/21 Baseline Weight: Weight: 80.7 kg Most recent weight: Weight: 76.7 kg          Patient Active Problem List   Diagnosis Date Noted   Endotracheal tube present    Protein-calorie malnutrition, severe 05/12/2021   COVID-19    DVT (deep venous thrombosis) ([place]) 05/08/2021   Pulmonary embolism ([place]) 05/05/2021   Pneumonia due to COVID-19 virus 04/24/2021   Essential hypertension 06/25/2018    Palliative Care Assessment & Plan    Recommendations/Plan: Domingo Dimes plans to come in this evening to speak with CCM. He is considering shifting to comfort care. Lanny Hurst provided phone number to his sister Tammy who he states has been estranged from both he and his father for over 21 years. Her phone number is [phone]; I was unable to successfully reach her.    Code Status:    Code Status Orders  (From admission, onward)           Start     Ordered   05/09/2021 1756  Full code  Continuous        05/19/2021 1756           Code Status History     This patient has a current code status but no historical code status.       Prognosis:  Very poor    Care plan was discussed with CCM  Thank you for allowing the Palliative Medicine Team to assist in the care of this patient.       Total Time 35 min 45 min 80 min Prolonged Time Billed  Yes       Greater than 50%  of this time was spent counseling and coordinating care related to the  above assessment and plan.  Asencion Gowda, NP  Please contact Palliative Medicine Team phone at [phone] for questions and concerns.

## 2021-05-18 NOTE — Progress Notes (Signed)
NAME:  Jonathan Salinas, MRN:  [id number], DOB:  1936/04/13, LOS: 15 ADMISSION DATE:  29-May-2021,   History of Present Illness:  85 y.o. Male admitted with Acute Hypoxic Respiratory Failure in the setting of COVID-19 Pneumonia and Pulmonary Embolism.  Underwent mechanical pulmonary thrombectomy on 04/27/2021.   Developed acute respiratory distress and hypoxia on 05/11/21 requiring emergent intubation and mechanical ventilation.  Pseudomonal PNA confirmed on tracheal aspirate, on cefepime.   Pertinent  Medical History  GERD  HTN CKD  Micro Data:  04/26/21: SARS-CoV-2 PCR>> Positive 05-29-2021: Blood culture x2>> no growth 29-May-2021: MRSA PCR>> negative 05/11/2021: Tracheal aspirate>>abundant pseudomonas, pansensitive   Antimicrobials:  Vancomycin 10/10>>10/11 Cefepime 10/10>>10/12, 10/21>>10/25 (7 day course) Unasyn 10/18>>10/21  Significant Hospital Events: Including procedures, antibiotic start and stop dates in addition to other pertinent events   05-29-2021: Pt admitted to ICU with acute hypoxic respiratory failure secondary to COVID-19 and multifocal pneumonia  05/05/21: Hospitalist took over care; Baricitinib started; CTA Chest with PE (right main pulmonary artery and bilateral subsegmental pulmonary arteries); + Right heart strain 05/06/21: Evaluated by Vascular Surgery, plan for Thrombectomy tomorrow 05/14/2021: Underwent Mechanical Thrombectomy of PE by Vascular Surgery; Venous US + for DVT Left posterior tibial veins 05/08/21: Transitioned to Eliquis 05/09/21: Transferred from Stepdown to Med-Surg (room 234) 05/10/21: Pulmonary consulted due to worsening oxygenation 05/11/21: Developed Acute Respiratory Distress and Hypoxia requiring transfer to ICU and EMERGENT INTUBATION, concern for possible aspiration 10/19 severe hypoxia 10/20 severe hypoxia, unable to wean from vent 10/21 Similar vent requirements 10/22 modest improvement in Vent requirements. Down to 40%,  PEEP10 10/23- patient failed SBT with severe aggitation and abnormal vital signs.  10/24 - failed SBT, tachycardia, hypertension. Slight improvement in ventilation requirements. O2 35%, PEEP 5.  10/25 - weaned sedation, did not tolerate - tachypnea, tachycardia, hypertension. Vent setting remain same - O2 35%, PEEP 5.   Interim History / Subjective:  Paroxysmal atrial fibrillation reported overnight per RN. Failed weaning off sedation this AM. New leukocytosis. Similar ventilatory requirements.   Objective   Blood pressure 112/65, pulse (!) 51, temperature 97.8 F (36.6 C), temperature source Axillary, resp. rate 14, height 6\' 1"  (1.854 m), weight 76.7 kg, SpO2 94 %.    Vent Mode: PRVC FiO2 (%):  [35 %] 35 % Set Rate:  [20 bmp] 20 bmp Vt Set:  [500 mL] 500 mL PEEP:  [5 cmH20] 5 cmH20 Plateau Pressure:  [11 cmH20-19 cmH20] 11 cmH20   Intake/Output Summary (Last 24 hours) at 05/18/2021 0735 Last data filed at 05/18/2021 0600 Gross per 24 hour  Intake 3151.93 ml  Output 1850 ml  Net 1301.93 ml   Filed Weights   05/16/21 0600 05/17/21 0457 05/18/21 0417  Weight: 76 kg 81.6 kg 76.7 kg   ROS unable to attain due to patient being intubated and sedated   Examination: General appearance: 85 y.o., male, intubated, sedated. Uncomfortable appearing during trial of decreased sedation.  Eyes: pupils round, reactive to light, 3 mm bilaterally. Minor chemosis lateral aspect sclera bilaterally.  HENT: small ulcer on L auricle, dry mucous membranes Lungs: course breath sounds bilaterally, equal chest rise, thick secretions and mucous in-line CV: Sinus tachycardia. No MRG.  Abdomen: Soft, non-tender; minimally distended, BS present  Extremities: No peripheral edema, radial and DP pulses present bilaterally  Skin: Normal temperature, turgor and texture; no rash. Per nursing- skin breakdown on sacrum, silicon foam dressing in place GU: foley in place.  Neuro: moved upper extremities  spontaneously. Wiggled toes bilaterally to  command. Did not follow other commands.    10/19>> respiratory culture -  pansensitive PsA (on cefepime)  10/25 >> CXR - Persistence of bilateral multifocal airspace densities.  Assessment & Plan:   #Acute hypoxic respiratory failure secondary to COVID-19 associated ARDS, HAP #HAP due to Pseudomonas aeruginosa Date of covid diagnosis: 04/26/21. Admitted 04/26/2021 Date intubated:05/11/21. Course c/b PsA HAP on cx 10/19. - full vent support - RASS -1 - add CPT, bronchodilators - continue cefepime for 7d course  - new leukocytosis --> CXR, procalcitonin ordered. Will consider additional antibiotics if needed.  *10/25>> CXR- persistence of bilateral multifocal airspace densities, procalcitonin 0.95. Will recheck CBC in AM to trend WBCs.   #Septic Shock (resolved) -- Pseudomonal PNA being treated with cefepime. Off vasopressors.   #AKI on CKD --trend electrolytes --ensure net negative fluid balance as above  #Submassive Pulmonary Embolism #LLE DVT - s/p mechanical pulmonary thrombectomy on 10/14.  - Heparin gtt transitioned to Eliquis   #Thrombocytopenia  - Heparin gtt changed to Eliquis  - HIT panel & seratonin assay  Best Practice (right click and "Reselect all SmartList Selections" daily)   Diet/type: tubefeeds DVT prophylaxis: systemic heparin GI prophylaxis: H2B Lines: Central line and yes and it is still needed. Discontinue arterial line.  Foley:  Yes, and it is still needed Code Status:  full code Last date of multidisciplinary goals of care discussion [05/14/21]  Ileana Roup PA-Student   Critical care attending attestation note:  Patient seen and examined and relevant ancillary tests reviewed.  I agree with the assessment and plan of care as outlined by Ileana Roup PA-Student. The following reflects my independent critical care time.   Synopsis of assessment and plan: 85 year old male was initially admitted with  COVID-19 pneumonia, complicated with pulmonary embolism status post mechanical thrombectomy and now with Pseudomonas pneumonia  Patient was unable to tolerate a spontaneous breathing trial due to tachypnea and hypoxia, had to switch back to [facility] for full support mechanical ventilation   Physical exam: General: Critically ill-appearing elderly Caucasian male, orally intubated HEENT: [place]/AT, eyes anicteric.  ETT and OGT in place Neuro: Sedated, not following commands.  Eyes are closed.  Pupils 3 mm bilateral reactive to light Chest: Coarse breath sounds, no wheezes or rhonchi Heart: Regular rate and rhythm, no murmurs or gallops Abdomen: Soft, nontender, nondistended, bowel sounds present Skin: No rash  Labs and images were reviewed  Assessment and plan: Acute hypoxic respiratory failure due to COVID-19 associated ARDS Complicated with Pseudomonas pneumonia Septic shock, resolved Acute kidney injury on CKD stage IIIa Submassive acute pulmonary embolism status post mechanical thrombectomy Acute left lower leg DVT Stage III sacral decubitus ulcer, POA  Patient is failing spontaneous breathing trial due to tachypnea and hypoxia He has been intubated for more than 7 days Palliative care is following for goals of care discussion, either to proceed with tracheostomy versus one-way palliative extubation Respiratory culture is growing Pseudomonas Continue IV cefepime Patient is off vasopressors, shock is resolved Serum creatinine is stable Monitor intake and output Continue IV heparin infusion Change position every 2 hours Consult wound care  CRITICAL CARE Performed by: Cheri Fowler   Total independent critical care time: 52 minutes  Critical care time was exclusive of separately billable procedures and treating other patients.  Critical care was necessary to treat or prevent imminent or life-threatening deterioration.   Critical care was time spent personally by me on the  following activities: development of treatment plan with patient and/or surrogate as well as  nursing, discussions with consultants, evaluation of patient's response to treatment, examination of patient, obtaining history from patient or surrogate, ordering and performing treatments and interventions, ordering and review of laboratory studies, ordering and review of radiographic studies, pulse oximetry, re-evaluation of patient's condition and participation in multidisciplinary rounds.  Cheri Fowler MD [facility] See Amion for pager If no response to pager, please call [phone] until 7pm After 7pm, Please call E-link [phone]  05/18/2021, 1:57 PM

## 2021-05-18 NOTE — Progress Notes (Signed)
Assisted tele visit to patient with daughter. ° °Celestina Gironda P, RN  °

## 2021-05-18 NOTE — Consult Note (Signed)
PHARMACY CONSULT NOTE  Pharmacy Consult for Electrolyte Monitoring and Replacement   Recent Labs: Potassium (mmol/L)  Date Value  05/18/2021 4.3   Magnesium (mg/dL)  Date Value  84/16/6063 2.4   Calcium (mg/dL)  Date Value  01/60/1093 8.3 (L)   Albumin (g/dL)  Date Value  23/55/7322 1.9 (L)  07/29/2019 4.2   Phosphorus (mg/dL)  Date Value  02/54/2706 3.2   Sodium (mmol/L)  Date Value  05/18/2021 144  07/29/2019 141    Assessment: Patient admitted with shortness of breath. Diagnosed with COVID pneumonia, PE/DVT and hyperkalemia. Patient s/p thrombectomy on 10/14. He has been on Eliquis. Patient required transfer to the ICU 10/18 after rapid response on the floor. He was intubated. Pharmacy consulted to manage electrolytes.  Nutrition: Tube feeds + free water flushes 30 mL q4h (180 mL/day)  Goal of Therapy:  Electrolytes within normal limits  Plan:  --No electrolyte replacement indicated at this time --Will continue to follow along  [name] 05/18/2021 11:05 AM

## 2021-05-18 NOTE — Progress Notes (Signed)
ANTICOAGULATION CONSULT NOTE  Pharmacy Consult for Heparin drip Indication: pulmonary embolus  Patient Measurements: Heparin Dosing Weight: 72 kg  Labs: Recent Labs    05/16/21 0431 05/16/21 1400 05/17/21 0457 05/17/21 1341 05/18/21 0512  HGB 8.5*  --  9.1*  --  10.7*  HCT 26.4*  --  27.4*  --  33.1*  PLT 186  --  153  --  104*  APTT 62*   < > 78* 86* 65*  HEPARINUNFRC 0.97*  --  0.92*  --  0.84*  CREATININE 1.61*  --  1.30*  --  1.30*   < > = values in this interval not displayed.     Estimated Creatinine Clearance: 46.7 mL/min (A) (by C-G formula based on SCr of 1.3 mg/dL (H)).   Medical History: Past Medical History:  Diagnosis Date   GERD (gastroesophageal reflux disease)    Hypertension      Assessment: 85 year old male presented with shortness of breath. Patient with PE s/p thrombectomy on 10/14. Patient has been on Eliquis. He was transferred to the ICU 10/18 after rapid response on the floor. He required intubation. Pharmacy consulted for heparin monitoring.  10/20 0416 aPTT 53; HL > 1.1   1250 > 1350 un/hr 10/20 1614 aPTT 99 10/21 0002 aPTT 146, supratherapeutic; 1350 > 1150 un/hr 10/21 1104 aPTT 69, therapeutic x1 1150 un/hr 10/21 1104 aPTT 87, therapeutic x2 1150un/hr 10/22 0445 aPTT 99, HL > 1.10 10/23 0431 aPTT 62, HL 0.97 [employer] to 1200 u/hr 10/23 2150 aPTT 65, slightly subtherapeutic 10/24 0457 aPTT 78, therapeutic, HL 0.92 10/24 1341 aPTT 86, therapeutic 10/25 0512 aPTT 65 subtherapeutic, HL 0.84 SUPRAtherapeutic   Goal of Therapy:  Heparin level 0.3-0.7 units/ml once aPTT and heparin level correlate.  aPTT 66-102 seconds Monitor platelets by anticoagulation protocol: Yes   Plan:  10/15 @ 0512 :   HL = 0.84, SUPRAtherapeutic , aPTT = 65 , subtherapeutic Will order Heparin 1050 units IV X 1 bolus and increase drip rate to 1450 units/hr.  Will recheck aPTT 8 hrs after rate change.  HL and aPTT still do not correlate.  Will continue to  use aPTT to guide dosing and recheck HL on 10/26 with AM labs.   -  Platelets 10/24 : 153 >>> 10/25 : 104    Consider HIT  Judson Tsan D  05/18/2021 6:39 AM

## 2021-05-19 DIAGNOSIS — J9601 Acute respiratory failure with hypoxia: Secondary | ICD-10-CM | POA: Diagnosis not present

## 2021-05-19 LAB — GLUCOSE, CAPILLARY
Glucose-Capillary: 119 mg/dL — ABNORMAL HIGH (ref 70–99)
Glucose-Capillary: 119 mg/dL — ABNORMAL HIGH (ref 70–99)
Glucose-Capillary: 120 mg/dL — ABNORMAL HIGH (ref 70–99)
Glucose-Capillary: 126 mg/dL — ABNORMAL HIGH (ref 70–99)
Glucose-Capillary: 127 mg/dL — ABNORMAL HIGH (ref 70–99)
Glucose-Capillary: 136 mg/dL — ABNORMAL HIGH (ref 70–99)

## 2021-05-19 LAB — BLOOD GAS, ARTERIAL
Acid-Base Excess: 2.3 mmol/L — ABNORMAL HIGH (ref 0.0–2.0)
Bicarbonate: 27.3 mmol/L (ref 20.0–28.0)
FIO2: 40
MECHVT: 500 mL
O2 Saturation: 89.5 %
PEEP: 5 cmH2O
Patient temperature: 37.9
RATE: 14 resp/min
pCO2 arterial: 45 mmHg (ref 32.0–48.0)
pH, Arterial: 7.4 (ref 7.350–7.450)
pO2, Arterial: 61 mmHg — ABNORMAL LOW (ref 83.0–108.0)

## 2021-05-19 LAB — CBC
HCT: 28.8 % — ABNORMAL LOW (ref 39.0–52.0)
Hemoglobin: 9.1 g/dL — ABNORMAL LOW (ref 13.0–17.0)
MCH: 31.8 pg (ref 26.0–34.0)
MCHC: 31.6 g/dL (ref 30.0–36.0)
MCV: 100.7 fL — ABNORMAL HIGH (ref 80.0–100.0)
Platelets: 74 10*3/uL — ABNORMAL LOW (ref 150–400)
RBC: 2.86 MIL/uL — ABNORMAL LOW (ref 4.22–5.81)
RDW: 14.7 % (ref 11.5–15.5)
WBC: 11.7 10*3/uL — ABNORMAL HIGH (ref 4.0–10.5)
nRBC: 0 % (ref 0.0–0.2)

## 2021-05-19 LAB — MAGNESIUM: Magnesium: 2.3 mg/dL (ref 1.7–2.4)

## 2021-05-19 LAB — HEPARIN INDUCED PLATELET AB (HIT ANTIBODY): Heparin Induced Plt Ab: 1.33 OD — ABNORMAL HIGH (ref 0.000–0.400)

## 2021-05-19 LAB — BASIC METABOLIC PANEL
Anion gap: 7 (ref 5–15)
BUN: 92 mg/dL — ABNORMAL HIGH (ref 8–23)
CO2: 26 mmol/L (ref 22–32)
Calcium: 8.2 mg/dL — ABNORMAL LOW (ref 8.9–10.3)
Chloride: 110 mmol/L (ref 98–111)
Creatinine, Ser: 1.58 mg/dL — ABNORMAL HIGH (ref 0.61–1.24)
GFR, Estimated: 43 mL/min — ABNORMAL LOW (ref >60)
Glucose, Bld: 137 mg/dL — ABNORMAL HIGH (ref 70–99)
Potassium: 4.9 mmol/L (ref 3.5–5.1)
Sodium: 143 mmol/L (ref 135–145)

## 2021-05-19 LAB — MRSA NEXT GEN BY PCR, NASAL: MRSA by PCR Next Gen: NOT DETECTED

## 2021-05-19 LAB — PHOSPHORUS: Phosphorus: 3.1 mg/dL (ref 2.5–4.6)

## 2021-05-19 LAB — PROCALCITONIN: Procalcitonin: 1.71 ng/mL

## 2021-05-19 MED ORDER — LACTATED RINGERS IV BOLUS
500.0000 mL | Freq: Once | INTRAVENOUS | Status: AC
  Administered 2021-05-19: 500 mL via INTRAVENOUS

## 2021-05-19 MED ORDER — MIDAZOLAM HCL 2 MG/2ML IJ SOLN
INTRAMUSCULAR | Status: AC
  Filled 2021-05-19: qty 2

## 2021-05-19 MED ORDER — NOREPINEPHRINE 4 MG/250ML-% IV SOLN
0.0000 ug/min | INTRAVENOUS | Status: DC
  Administered 2021-05-19 – 2021-05-20 (×2): 2 ug/min via INTRAVENOUS

## 2021-05-19 MED ORDER — MIDAZOLAM HCL 2 MG/2ML IJ SOLN
2.0000 mg | INTRAMUSCULAR | Status: DC | PRN
  Administered 2021-05-19 – 2021-05-24 (×12): 2 mg via INTRAVENOUS
  Filled 2021-05-19 (×12): qty 2

## 2021-05-19 MED ORDER — PIPERACILLIN-TAZOBACTAM 3.375 G IVPB
3.3750 g | Freq: Three times a day (TID) | INTRAVENOUS | Status: DC
  Administered 2021-05-19 – 2021-05-20 (×3): 3.375 g via INTRAVENOUS
  Filled 2021-05-19 (×3): qty 50

## 2021-05-19 MED ORDER — PROSOURCE TF PO LIQD
45.0000 mL | Freq: Two times a day (BID) | ORAL | Status: DC
  Administered 2021-05-19 – 2021-05-24 (×6): 45 mL
  Filled 2021-05-19: qty 45

## 2021-05-19 MED ORDER — DEXMEDETOMIDINE HCL IN NACL 400 MCG/100ML IV SOLN
0.4000 ug/kg/h | INTRAVENOUS | Status: DC
Start: 2021-05-19 — End: 2021-05-25
  Administered 2021-05-19: 1 ug/kg/h via INTRAVENOUS
  Administered 2021-05-19: 1.2 ug/kg/h via INTRAVENOUS
  Administered 2021-05-19: 1 ug/kg/h via INTRAVENOUS
  Administered 2021-05-20: 0.8 ug/kg/h via INTRAVENOUS
  Administered 2021-05-20: 1 ug/kg/h via INTRAVENOUS
  Administered 2021-05-20 – 2021-05-22 (×8): 0.8 ug/kg/h via INTRAVENOUS
  Administered 2021-05-22: 0.6 ug/kg/h via INTRAVENOUS
  Administered 2021-05-23: 0.8 ug/kg/h via INTRAVENOUS
  Administered 2021-05-23 – 2021-05-24 (×5): 1 ug/kg/h via INTRAVENOUS
  Administered 2021-05-24: 1.5 ug/kg/h via INTRAVENOUS
  Administered 2021-05-24: 1 ug/kg/h via INTRAVENOUS
  Administered 2021-05-24: 1.5 ug/kg/h via INTRAVENOUS
  Filled 2021-05-19 (×24): qty 100

## 2021-05-19 MED ORDER — NOREPINEPHRINE 4 MG/250ML-% IV SOLN
INTRAVENOUS | Status: AC
  Filled 2021-05-19: qty 250

## 2021-05-19 MED ORDER — LEVALBUTEROL HCL 0.63 MG/3ML IN NEBU
0.6300 mg | INHALATION_SOLUTION | Freq: Four times a day (QID) | RESPIRATORY_TRACT | Status: DC
  Administered 2021-05-19 – 2021-05-22 (×13): 0.63 mg via RESPIRATORY_TRACT
  Filled 2021-05-19 (×13): qty 3

## 2021-05-19 MED ORDER — VITAL 1.5 CAL PO LIQD
1000.0000 mL | ORAL | Status: DC
  Administered 2021-05-19 – 2021-05-21 (×2): 1000 mL

## 2021-05-19 MED ORDER — MIDAZOLAM-SODIUM CHLORIDE 100-0.9 MG/100ML-% IV SOLN
0.5000 mg/h | INTRAVENOUS | Status: DC
  Administered 2021-05-19: 2 mg/h via INTRAVENOUS
  Administered 2021-05-22: 0.5 mg/h via INTRAVENOUS
  Filled 2021-05-19 (×2): qty 100

## 2021-05-19 MED ORDER — TOBRAMYCIN 300 MG/5ML IN NEBU
300.0000 mg | INHALATION_SOLUTION | Freq: Two times a day (BID) | RESPIRATORY_TRACT | Status: DC
  Administered 2021-05-19 – 2021-05-20 (×4): 300 mg via RESPIRATORY_TRACT
  Filled 2021-05-19 (×7): qty 5

## 2021-05-19 NOTE — Progress Notes (Signed)
Nutrition Follow Up Note   DOCUMENTATION CODES:   Severe malnutrition in context of social or environmental circumstances  INTERVENTION:   Change to Vital 1.5 _0 /hr + ProSource TF 11m BID via tube   Free water flushes 350mq4 hours to maintain tube patency   Regimen provides 2240kcal/day, 119g/day protein and 128072may free water   Juven Fruit Punch BID via tube, each serving provides 95kcal and 2.5g of protein (amino acids glutamine and arginine)  NUTRITION DIAGNOSIS:   Severe Malnutrition related to social / environmental circumstances (advanced age) as evidenced by severe fat depletion, severe muscle depletion.  GOAL:   Provide needs based on ASPEN/SCCM guidelines -met   MONITOR:   Vent status, Labs, Weight trends, TF tolerance, Skin, I & O's  ASSESSMENT:   85 8o male with h/o HTN and GERD who is admitted with COVID PNA, b/l PE s/p thrombectomy 10/14, AKI and possible aspiration requiring intubation and ventilation.  Pt remains sedated and ventilated. OGT in place. Pt tolerating tube feeds well at goal rate; will adjust tube feeds as propofol is now discontinued. Plan is for possible SBTs; family declines trach. Per chart, pt is weight stable since admit.   Medications reviewed and include: colace, pepcid, insulin, juven, miralax, cefepime  Labs reviewed: K 4.9 wnl, BUN 92(H), creat 1.58(H), P 3.1 wnl, Mg 2.3 wnl Wbc- 11.7(H), Hgb 9.1(L), Hct 28.8(L) Cbgs- 119, 126, 136 x 24 hrs   Patient is currently intubated on ventilator support MV: 10.6 L/min Temp (24hrs), Avg:99.9 F (37.7 C), Min:98.1 F (36.7 C), Max:101.5 F (38.6 C)  Propofol: none   MAP- >11m41m UOP- [id number] Order:   Diet Order             Diet NPO time specified  Diet effective now                  EDUCATION NEEDS:   No education needs have been identified at this time  Skin:  Skin Assessment: Reviewed RN Assessment (DTI sacrum)  Last BM:  10/25- type 6  Height:    Ht Readings from Last 1 Encounters:  05/16/2021 6' 1" (1.854 m)    Weight:   Wt Readings from Last 1 Encounters:  05/19/21 77.3 kg    Ideal Body Weight:  83.6 kg  BMI:  Body mass index is 22.48 kg/m.  Estimated Nutritional Needs:   Kcal:  2200-2500kcal/day  Protein:  110-125g/day  Fluid:  2.0-2.3L/day  CaseyKoleen DistanceRD, LDN Please refer to [facility] Center[facility] and/or RD on-call/weekend/after hours pager

## 2021-05-19 NOTE — Progress Notes (Addendum)
Pt failed SAT/SBT today. Not following commands or opening eyes to voice.   Pt remains on Fent gtt. Dex added, Prop stopped. Versed gtt added after pt became tachy and tachypneic following stoppage of Prop. PRN Versed order added.   Levo gtt started following Versed infusion.   Temp spiked @ 38.6 this AM @ 0800. Temp @ 1600 was 37.7.   Per Dr. Thora Lance, Central line removed and 2 PIVs placed.   Foley remains in place. Putting out adequate urine. 950 ml out.  Turn q2. Pt in no acute distress @ this time. Will continue to monitor.

## 2021-05-19 NOTE — Consult Note (Signed)
PHARMACY CONSULT NOTE  Pharmacy Consult for Electrolyte Monitoring and Replacement   Recent Labs: Potassium (mmol/L)  Date Value  05/19/2021 4.9   Magnesium (mg/dL)  Date Value  28/36/6294 2.3   Calcium (mg/dL)  Date Value  76/54/6503 8.2 (L)   Albumin (g/dL)  Date Value  54/65/6812 1.9 (L)  07/29/2019 4.2   Phosphorus (mg/dL)  Date Value  75/17/0017 3.1   Sodium (mmol/L)  Date Value  05/19/2021 143  07/29/2019 141    Assessment: Patient admitted with shortness of breath. Diagnosed with COVID pneumonia, PE/DVT and hyperkalemia. Patient s/p thrombectomy on 10/14. He has been on Eliquis. Patient required transfer to the ICU 10/18 after rapid response on the floor. He was intubated. Pharmacy consulted to manage electrolytes.  Nutrition: Tube feeds + free water flushes 30 mL q4h (180 mL/day)  Goal of Therapy:  Electrolytes within normal limits  Plan:  --No electrolyte replacement indicated at this time. Worsening Scr noted. K+ at ULN. Likely secondary to IV Lasix yesterday --Will continue to follow along  [name] 05/19/2021 7:45 AM

## 2021-05-19 NOTE — TOC Progression Note (Signed)
Transition of Care [facility]) - Progression Note    Patient Details  Name: Jonathan Salinas MRN: [id number] Date of Birth: 1936/02/16  Transition of Care [facility]) CM/SW Contact  Jonathan Salinas Phone Number: [phone] 05/19/2021, 11:00 AM  Clinical Narrative:     Patient remains intubated, unresponsive and does not open eyes to command.  Critical Care Attending has spoken to patient's son Jonathan Salinas, Jonathan Salinas)  [phone].  Patient is now DNR and tracheostomy is not an option.  Palliative Care NP has spoke with patient's daughter Jonathan Salinas who has verbalized she would prefer comfort care for the patient, but has stated she would defer to Jonathan Salinas decision for the patient care. TOC will continue to follow   Expected Discharge Plan: Home/Self Care Barriers to Discharge: Continued Medical Work up  Expected Discharge Plan and Services Expected Discharge Plan: Home/Self Care In-house Referral: Clinical Social Work     Living arrangements for the past 2 months: Single Family Home                                       Social Determinants of Health (SDOH) Interventions    Readmission Risk Interventions No flowsheet data found.

## 2021-05-19 NOTE — Progress Notes (Signed)
NAME:  Jonathan Salinas, MRN:  [id number], DOB:  Feb 26, 1936, LOS: 16 ADMISSION DATE:  May 12, 2021,   History of Present Illness:  85 y.o. Male admitted with Acute Hypoxic Respiratory Failure in the setting of COVID-19 Pneumonia and Pulmonary Embolism.  Underwent mechanical pulmonary thrombectomy on 05/02/2021.   Developed acute respiratory distress and hypoxia on 05/11/21 requiring emergent intubation and mechanical ventilation.  Pseudomonal PNA confirmed on tracheal aspirate, on cefepime.   Pertinent  Medical History  GERD  HTN CKD  Micro Data:  04/26/21: SARS-CoV-2 PCR>> Positive 05/12/21: Blood culture x2>> no growth 05/12/2021: MRSA PCR>> negative 05/11/2021: Tracheal aspirate>>abundant pseudomonas, pansensitive  05/11/2021: Fungal negative   Antimicrobials:  Vancomycin 10/10>>10/11 Cefepime 10/10>>10/12, 10/21>>10/26 (7 day course) Unasyn 10/18>>10/21  Significant Hospital Events: Including procedures, antibiotic start and stop dates in addition to other pertinent events   05/12/21: Pt admitted to ICU with acute hypoxic respiratory failure secondary to COVID-19 and multifocal pneumonia  05/05/21: Hospitalist took over care; Baricitinib started; CTA Chest with PE (right main pulmonary artery and bilateral subsegmental pulmonary arteries); + Right heart strain 05/06/21: Evaluated by Vascular Surgery, plan for Thrombectomy tomorrow 05/11/2021: Underwent Mechanical Thrombectomy of PE by Vascular Surgery; Venous US + for DVT Left posterior tibial veins 05/08/21: Transitioned to Eliquis 05/09/21: Transferred from Stepdown to Med-Surg (room 234) 05/10/21: Pulmonary consulted due to worsening oxygenation 05/11/21: Developed Acute Respiratory Distress and Hypoxia requiring transfer to ICU and EMERGENT INTUBATION, concern for possible aspiration 10/19 severe hypoxia 10/20 severe hypoxia, unable to wean from vent 10/21 Similar vent requirements 10/22 modest improvement in Vent requirements.  Down to 40%, PEEP10 10/23- patient failed SBT with severe aggitation and abnormal vital signs.  10/24 - failed SBT, tachycardia, hypertension. Slight improvement in ventilation requirements. O2 35%, PEEP 5.  10/25 - weaned sedation, did not tolerate - tachypnea, tachycardia, hypertension. Vent setting remain same - O2 35%, PEEP 5.  10/26 - failed weaning of sedation prior to SVT. New fever overnight. Similar Vent settings.   Interim History / Subjective:  New fever overnight. Similar ventilatory requirements. Failed SBT.  Objective   Blood pressure (!) 95/46, pulse (!) 125, temperature (!) 101.5 F (38.6 C), temperature source Axillary, resp. rate 20, height 6\' 1"  (1.854 m), weight 77.3 kg, SpO2 90 %.    Vent Mode: PRVC FiO2 (%):  [35 %] 35 % Set Rate:  [20 bmp] 20 bmp Vt Set:  [500 mL] 500 mL PEEP:  [5 cmH20] 5 cmH20 Pressure Support:  [10 cmH20] 10 cmH20 Plateau Pressure:  [12 cmH20-17 cmH20] 12 cmH20   Intake/Output Summary (Last 24 hours) at 05/19/2021 1248 Last data filed at 05/19/2021 0600 Gross per 24 hour  Intake 2289.32 ml  Output 950 ml  Net 1339.32 ml   Filed Weights   05/17/21 0457 05/18/21 0417 05/19/21 0500  Weight: 81.6 kg 76.7 kg 77.3 kg   ROS unable to attain due to patient being intubated and sedated   Examination: General appearance: 85 y.o., male, intubated, sedated. Uncomfortable appearing during trial of decreased sedation.  Eyes: pupils round, minimally reactive to light, 3 mm bilaterally. Minor chemosis lateral aspect sclera bilaterally.  HENT: small ulcer on L auricle, dry mucous membranes Lungs: course breath sounds bilaterally, equal chest rise, thick secretions and mucous in-line. No evidence of empyema on POCUS. Mild effusion L dependent lung.  CV: Sinus tachycardia. Paroxysmal atrial fibrillation. No MRG.  Abdomen: Soft, non-tender; minimally distended, BS present  Extremities: No peripheral edema, radial and DP pulses present bilaterally  Skin:  Normal temperature, turgor and texture; no rash. Skin breakdown overlying dark bruising on sacrum, silicon foam dressing in place.  GU: foley in place.  Neuro: sedated. Does not follow commands. No response to noxious stimuli.   10/19>> respiratory culture -  pansensitive PsA (on cefepime)  10/25 >> CXR - Persistence of bilateral multifocal airspace densities.  Assessment & Plan:  **GOALS OF CARE discussion high priority - continue to pursue treatment vs. Transition to more comfort based care.   #Acute hypoxic respiratory failure secondary to COVID-19 associated ARDS, HAP #HAP due to Pseudomonas aeruginosa Date of covid diagnosis: 04/26/21. Admitted 2021-05-05 Date intubated:05/11/21. Course c/b PsA HAP on cx 10/19.  - full vent support - RASS -1 - CPT, bronchodilators - continue cefepime for 7d course, today last day of treatment (10/26). Consider new abx tomorrow considering sensitives of tracheal aspirate.  - leukocytosis --> improved from yesterday; decreased from 15.4 (10/25) to 11.7 (10/26). Procalcitonin trending upward (1.71), new fever overnight (Tmax- 101.45F), tachycardia. PNA treatment with cefepime may be subtherapeutic considering clinical picture.  -Start tobramycin inhaler. Consider different systemic abx tomorrow.  -No evidence of empyema on POCUS   #Septic Shock (resolved) -- Pseudomonal PNA being treated with cefepime. Off vasopressors.   #AKI on CKD -- 10/26>> Increased Creatinine, decreased GFR --> could be from one dose furosemide yesterday. Recheck CMP tomorrow.  -- trend electrolytes --ensure net negative fluid balance as above  #Submassive Pulmonary Embolism #LLE DVT - s/p mechanical pulmonary thrombectomy on 10/14.  - Heparin gtt transitioned to Eliquis   #Thrombocytopenia  - Heparin gtt changed to Eliquis  - Continued downward trend platelets - HIT panel & seratonin assay  Best Practice (right click and "Reselect all SmartList Selections" daily)    Diet/type: tubefeeds DVT prophylaxis: systemic heparin GI prophylaxis: H2B Lines: Central line and yes and it is still needed.  Foley:  Yes, and it is still needed Code Status:  full code Last date of multidisciplinary goals of care discussion [05/14/21]  Ileana Roup PA-Student  [facility] MPAS

## 2021-05-20 ENCOUNTER — Inpatient Hospital Stay: Payer: Medicare Other

## 2021-05-20 DIAGNOSIS — J9601 Acute respiratory failure with hypoxia: Secondary | ICD-10-CM | POA: Diagnosis not present

## 2021-05-20 LAB — CBC
HCT: 27.7 % — ABNORMAL LOW (ref 39.0–52.0)
Hemoglobin: 8.7 g/dL — ABNORMAL LOW (ref 13.0–17.0)
MCH: 31.2 pg (ref 26.0–34.0)
MCHC: 31.4 g/dL (ref 30.0–36.0)
MCV: 99.3 fL (ref 80.0–100.0)
Platelets: 49 10*3/uL — ABNORMAL LOW (ref 150–400)
RBC: 2.79 MIL/uL — ABNORMAL LOW (ref 4.22–5.81)
RDW: 14.6 % (ref 11.5–15.5)
WBC: 11.4 10*3/uL — ABNORMAL HIGH (ref 4.0–10.5)
nRBC: 0 % (ref 0.0–0.2)

## 2021-05-20 LAB — BASIC METABOLIC PANEL
Anion gap: 9 (ref 5–15)
Anion gap: 9 (ref 5–15)
BUN: 96 mg/dL — ABNORMAL HIGH (ref 8–23)
BUN: 118 mg/dL — ABNORMAL HIGH (ref 8–23)
CO2: 24 mmol/L (ref 22–32)
CO2: 24 mmol/L (ref 22–32)
Calcium: 7.9 mg/dL — ABNORMAL LOW (ref 8.9–10.3)
Calcium: 7.9 mg/dL — ABNORMAL LOW (ref 8.9–10.3)
Chloride: 110 mmol/L (ref 98–111)
Chloride: 109 mmol/L (ref 98–111)
Creatinine, Ser: 2.63 mg/dL — ABNORMAL HIGH (ref 0.61–1.24)
Creatinine, Ser: 3.16 mg/dL — ABNORMAL HIGH (ref 0.61–1.24)
GFR, Estimated: 23 mL/min — ABNORMAL LOW (ref >60)
GFR, Estimated: 19 mL/min — ABNORMAL LOW (ref >60)
Glucose, Bld: 158 mg/dL — ABNORMAL HIGH (ref 70–99)
Glucose, Bld: 140 mg/dL — ABNORMAL HIGH (ref 70–99)
Potassium: 5.9 mmol/L — ABNORMAL HIGH (ref 3.5–5.1)
Potassium: 5.9 mmol/L — ABNORMAL HIGH (ref 3.5–5.1)
Sodium: 143 mmol/L (ref 135–145)
Sodium: 142 mmol/L (ref 135–145)

## 2021-05-20 LAB — GLUCOSE, CAPILLARY
Glucose-Capillary: 114 mg/dL — ABNORMAL HIGH (ref 70–99)
Glucose-Capillary: 120 mg/dL — ABNORMAL HIGH (ref 70–99)
Glucose-Capillary: 131 mg/dL — ABNORMAL HIGH (ref 70–99)
Glucose-Capillary: 135 mg/dL — ABNORMAL HIGH (ref 70–99)
Glucose-Capillary: 135 mg/dL — ABNORMAL HIGH (ref 70–99)
Glucose-Capillary: 141 mg/dL — ABNORMAL HIGH (ref 70–99)

## 2021-05-20 LAB — FIBRINOGEN: Fibrinogen: >800 mg/dL — ABNORMAL HIGH (ref 210–475)

## 2021-05-20 LAB — PROCALCITONIN: Procalcitonin: 4.86 ng/mL

## 2021-05-20 LAB — PHOSPHORUS: Phosphorus: 4.2 mg/dL (ref 2.5–4.6)

## 2021-05-20 LAB — PROTIME-INR
INR: 2.1 — ABNORMAL HIGH (ref 0.8–1.2)
Prothrombin Time: 23.6 seconds — ABNORMAL HIGH (ref 11.4–15.2)

## 2021-05-20 LAB — MAGNESIUM: Magnesium: 2.4 mg/dL (ref 1.7–2.4)

## 2021-05-20 LAB — APTT: aPTT: 48 seconds — ABNORMAL HIGH (ref 24–36)

## 2021-05-20 LAB — POTASSIUM: Potassium: 5.7 mmol/L — ABNORMAL HIGH (ref 3.5–5.1)

## 2021-05-20 LAB — D-DIMER, QUANTITATIVE: D-Dimer, Quant: 3.85 ug/mL-FEU — ABNORMAL HIGH (ref 0.00–0.50)

## 2021-05-20 MED ORDER — SODIUM CHLORIDE 0.9 % IV SOLN: 0.0000 mg/h | INTRAVENOUS | Status: DC

## 2021-05-20 MED ORDER — INSULIN ASPART 100 UNIT/ML IV SOLN
10.0000 [IU] | Freq: Once | INTRAVENOUS | Status: AC
  Administered 2021-05-20: 10 [IU] via INTRAVENOUS
  Filled 2021-05-20: qty 0.1

## 2021-05-20 MED ORDER — LACTATED RINGERS IV BOLUS
1000.0000 mL | Freq: Once | INTRAVENOUS | Status: AC
  Administered 2021-05-20: 1000 mL via INTRAVENOUS

## 2021-05-20 MED ORDER — PIPERACILLIN-TAZOBACTAM IN DEX 2-0.25 GM/50ML IV SOLN
2.2500 g | Freq: Three times a day (TID) | INTRAVENOUS | Status: DC
  Administered 2021-05-20 – 2021-05-21 (×3): 2.25 g via INTRAVENOUS
  Filled 2021-05-20 (×4): qty 50

## 2021-05-20 MED ORDER — FAMOTIDINE 20 MG PO TABS
20.0000 mg | ORAL_TABLET | ORAL | Status: DC
  Administered 2021-05-22: 20 mg
  Filled 2021-05-20: qty 1

## 2021-05-20 MED ORDER — DEXTROSE 50 % IV SOLN
1.0000 | Freq: Once | INTRAVENOUS | Status: AC
  Administered 2021-05-20: 50 mL via INTRAVENOUS
  Filled 2021-05-20: qty 50

## 2021-05-20 MED ORDER — SODIUM ZIRCONIUM CYCLOSILICATE 5 G PO PACK
10.0000 g | PACK | Freq: Once | ORAL | Status: AC
  Administered 2021-05-20: 10 g
  Filled 2021-05-20: qty 2

## 2021-05-20 MED ORDER — ARGATROBAN 50 MG/50ML IV SOLN
0.2500 ug/kg/min | INTRAVENOUS | Status: DC
  Administered 2021-05-20 – 2021-05-21 (×2): 0.25 ug/kg/min via INTRAVENOUS
  Filled 2021-05-20 (×3): qty 50

## 2021-05-20 MED ORDER — SODIUM ZIRCONIUM CYCLOSILICATE 5 G PO PACK
10.0000 g | PACK | Freq: Two times a day (BID) | ORAL | Status: DC
  Administered 2021-05-21: 10 g
  Filled 2021-05-20: qty 2

## 2021-05-20 NOTE — Progress Notes (Addendum)
No changes made to vent.   1L LR Bolus infused.   Foley remains in place.   Pt remains on Fent, Dex, and Versed gtt's.  Fent @ 200 Dex @ 0.8 Versed @ 2  Per Dr. Thora Lance, try to wean Versed gtt off tonight. Dilaudid gtt order in place if needed.   Argatroban gtt started this evening d/t potential HIT.   Tube feeds continue to run.   Potassium elevated @ outset of shift, K rechecked = 5.9. 10u IV insulin and 1x D50 given. K recheck = 5.7. Lokelma ordered and given.   2nd round of 10u IV insulin and 1x D50 given near shift change. Will recheck K during night shift. Lab order in place.

## 2021-05-20 NOTE — Progress Notes (Signed)
Pharmacy Heparin Induced Thrombocytopenia (HIT) Note:  Jonathan Salinas is an 85 y.o. male being evaluated for HIT. Heparin was started 05/23/2021 for PE, and baseline platelets were 315. Patient was transitioned off of IV heparin to apixaban 10/15 at which time platelets were 347. Apixaban was stopped 10/18 and patient was re-started on IV heparin at which point platelets were 385. IV heparin was continued until 10/25 and platelets had decreased to 104. PF4 Ab and SRA were sent out and patient was transitioned back onto apixaban. Since discontinuation of IV heparin, platelets have continued to trend down to 49 on 10/27.  HIT labs were ordered on 10/25 when platelets dropped to 104.  Auto-populate labs:  Heparin Induced Plt Ab  Date/Time Value Ref Range Status  05/18/2021 12:12 PM 1.330 (H) 0.000 - 0.400 OD Final    Comment:    (NOTE) Performed At: [facility] [address] [place], [place] [id number] Jonathan Schimke MD IO:[id number]      CALCULATE SCORE:  4Ts (see the HIT Algorithm) Score  Thrombocytopenia 2 (nadir not yet established)  Timing 2  Thrombosis 0  Other causes of thrombocytopenia 1 (sepsis / critical illness)  Total 5 (intermediate probability ~14%)    Recommendations (A or B) are based on available lab results (HIT antibody and/or SRA) and the HIT algorithm    A. HIT antibody result available  Possible HIT   Order SRA:  Yes Discontinue heparin / LMWH:  Yes Initiate alternative anticoagulation:  Yes (patient was re-started on apixaban which was discontinued 10/27 secondary to thrombocytopenia) Document heparin allergy:  Yes  B. SRA result availability  SRA not available; pending  Plan Labs ordered:  SRA ordered and pending Heparin allergy:  Heparin allergy documented or updated. Anticoagulation plans: Consideration is being given to DTI therapy with argatroban. Patient already has existing indication for anticoagulation which is recently diagnosed  PE.    Jonathan Salinas 05/20/2021, 3:22 PM

## 2021-05-20 NOTE — Progress Notes (Signed)
NAME:  Jonathan Salinas, MRN:  [id number], DOB:  1935-09-23, LOS: 17 ADMISSION DATE:  05/15/21,   History of Present Illness:  85 y.o. Male admitted with Acute Hypoxic Respiratory Failure in the setting of COVID-19 Pneumonia and Pulmonary Embolism.  Underwent mechanical pulmonary thrombectomy on 05/17/2021.   Developed acute respiratory distress and hypoxia on 05/11/21 requiring emergent intubation and mechanical ventilation.  Pseudomonal PNA confirmed on tracheal aspirate, on cefepime.   Pertinent  Medical History  GERD  HTN CKD  Micro Data:  04/26/21: SARS-CoV-2 PCR>> Positive May 15, 2021: Blood culture x2>> no growth 2021-05-15: MRSA PCR>> negative 05/11/2021: Tracheal aspirate>>abundant pseudomonas, pansensitive  05/11/2021: Fungal negative   Antimicrobials:  Vancomycin 10/10>>10/11 Cefepime 10/10>>10/12, 10/21>>10/26 (7 day course) Unasyn 10/18>>10/21 Zosyn 10/27 >>   Significant Hospital Events: Including procedures, antibiotic start and stop dates in addition to other pertinent events   2021-05-15: Pt admitted to ICU with acute hypoxic respiratory failure secondary to COVID-19 and multifocal pneumonia  05/05/21: Hospitalist took over care; Baricitinib started; CTA Chest with PE (right main pulmonary artery and bilateral subsegmental pulmonary arteries); + Right heart strain 05/06/21: Evaluated by Vascular Surgery, plan for Thrombectomy tomorrow 05/01/2021: Underwent Mechanical Thrombectomy of PE by Vascular Surgery; Venous US + for DVT Left posterior tibial veins 05/08/21: Transitioned to Eliquis 05/09/21: Transferred from Stepdown to Med-Surg (room 234) 05/10/21: Pulmonary consulted due to worsening oxygenation 05/11/21: Developed Acute Respiratory Distress and Hypoxia requiring transfer to ICU and EMERGENT INTUBATION, concern for possible aspiration 10/19 severe hypoxia 10/20 severe hypoxia, unable to wean from vent 10/21 Similar vent requirements 10/22 modest improvement in  Vent requirements. Down to 40%, PEEP10 10/23- patient failed SBT with severe aggitation and abnormal vital signs.  10/24 - failed SBT, tachycardia, hypertension. Slight improvement in ventilation requirements. O2 35%, PEEP 5.  10/25 - weaned sedation, did not tolerate - tachypnea, tachycardia, hypertension. Vent setting remain same - O2 35%, PEEP 5.  10/26 - failed weaning of sedation prior to SVT. New fever overnight. Similar Vent settings.  10/27 - Septic. Increased vent requirements. O2- 50%, PEEP 5.   Interim History / Subjective:  Patient off pressors, started on versed & precedex gtt. Not tolerating SBT. Goals of care discussion tomorrow.   Objective   Blood pressure (!) 104/59, pulse 95, temperature 99.1 F (37.3 C), temperature source Axillary, resp. rate 18, height 6\' 1"  (1.854 m), weight 79.3 kg, SpO2 94 %.    Vent Mode: PRVC FiO2 (%):  [50 %] 50 % Set Rate:  [20 bmp] 20 bmp Vt Set:  [500 mL] 500 mL PEEP:  [5 cmH20] 5 cmH20 Plateau Pressure:  [15 cmH20-26 cmH20] 17 cmH20   Intake/Output Summary (Last 24 hours) at 05/20/2021 1424 Last data filed at 05/20/2021 1300 Gross per 24 hour  Intake 3430.09 ml  Output 900 ml  Net 2530.09 ml   Filed Weights   05/18/21 0417 05/19/21 0500 05/20/21 0346  Weight: 76.7 kg 77.3 kg 79.3 kg   ROS unable to attain due to patient being intubated and sedated   Examination: General appearance: 85 y.o., male, intubated, sedated.  Eyes: pupils round, minimally reactive to light, 3 mm bilaterally. Minor chemosis lateral aspect sclera bilaterally.  HENT: small ulcer on L auricle, dry mucous membranes Lungs: course breath sounds bilaterally, equal chest rise, thick secretions and mucous in-line.  CV: Sinus tachycardia. Paroxysmal atrial fibrillation. No MRG.  Abdomen: Soft, non-tender; minimally distended, BS present  Extremities: No peripheral edema, radial and DP pulses present bilaterally  Skin: Normal temperature,  turgor and texture; no  rash. Skin breakdown overlying dark bruising on sacrum, silicon foam dressing in place.  GU: foley in place.  Neuro: sedated. Does not follow commands. No response to noxious stimuli.   10/19>> respiratory culture -  pansensitive PsA (on cefepime)  10/25 >> CXR - Persistence of bilateral multifocal airspace densities. 10/27>> CXR - No significant change in bilateral opacities   Assessment & Plan:  **GOALS OF CARE discussion high priority - continue to pursue treatment vs. Transition to more comfort based care. Family may consider compassionate extubation tomorrow.   #Acute hypoxic respiratory failure secondary to COVID-19 associated ARDS, HAP #HAP due to Pseudomonas aeruginosa Date of covid diagnosis: 04/26/21. Admitted 05-17-21 Date intubated:05/11/21. Course c/b PsA HAP on cx 10/19.  - full vent support - RASS -1 - CPT, bronchodilators, TOBIneb - Cefepime 7 day course complete, started zosyn 10/27  - leukocytosis --> WBCs downtrending, procal elevated (4.86), temperature decreased, still mildly elevated   #Thrombocytopenia  - Eliquis discontinued  - Continued downward trend platelets, worsening 10/27- 49 - HIT panel & seratonin assay pending - Possible treatment with argatroban or bivalirudin if convincing HIT picture   #HYPERKALEMIA  #AKI on CKD -- 10/27>> Increasing Cr, decreased GFR  -- Pharmacy consult, treat electrolytes as needed -- trend electrolytes  -- ensure net negative fluid balance as above  #Septic Shock (resolved) -- Pseudomonal PNA being treated with cefepime. Off vasopressors.   #Submassive Pulmonary Embolism #LLE DVT - s/p mechanical pulmonary thrombectomy on 10/14.  - Eliquis stopped, see above    Best Practice (right click and "Reselect all SmartList Selections" daily)   Diet/type: tubefeeds DVT prophylaxis: systemic heparin GI prophylaxis: H2B Lines: Central line and yes and it is still needed.  Foley:  Yes, and it is still needed Code Status:   full code Last date of multidisciplinary goals of care discussion [05/14/21]  Ileana Roup PA-Student  [facility] MPAS

## 2021-05-20 NOTE — Consult Note (Signed)
ANTICOAGULATION CONSULT NOTE  Pharmacy Consult for Argatroban Indication: pulmonary embolus and concern for HIT  Allergies  Allergen Reactions   Heparin Other (See Comments)    Heparin antibody positive; SRA pending   Penicillins     Patient Measurements: Height: 6\' 1"  (185.4 cm) Weight: 79.3 kg (174 lb 13.2 oz) IBW/kg (Calculated) : 79.9   Labs: Recent Labs    05/18/21 0512 05/19/21 0509 05/20/21 0520 05/20/21 1119  HGB 10.7* 9.1* 8.7*  --   HCT 33.1* 28.8* 27.7*  --   PLT 104* 74* 49*  --   APTT 65*  --   --  48*  LABPROT  --   --   --  23.6*  INR  --   --   --  2.1*  HEPARINUNFRC 0.84*  --   --   --   CREATININE 1.30* 1.58* 2.63* 3.16*    Estimated Creatinine Clearance: 19.9 mL/min (A) (by C-G formula based on SCr of 3.16 mg/dL (H)).   Medical History: Past Medical History:  Diagnosis Date   GERD (gastroesophageal reflux disease)    Hypertension     Medications:  Patient has been on both apixaban and heparin this admission  Assessment: Patient admitted with shortness of breath. Diagnosed with COVID pneumonia complicated by PE/DVT. Patient s/p thrombectomy on 10/14. Patient required transfer to the ICU 10/18 after rapid response on the floor. He was intubated. Patient remains intubated, sedated and on mechanical ventilation in the ICU. Stay has been complicated by worsening thrombocytopenia while on IV heparin for PE treatment. PF4 antibody indeterminate. SRA pending. 4T score = 5 (intermediate probability). Pharmacy consulted to start argatroban infusion for known PE and suspected HIT.  Labs 10/27: aPTT 48s INR 2.1 Fibrinogen > 800 D-dimer 3.85  Goal of Therapy:  aPTT 50 - 90 seconds Monitor platelets by anticoagulation protocol: Yes   Plan:  --Will start argatroban at 0.25 mcg/kg/min. aPTT monitoring complicated by baseline elevation.  --Check aPTT 4 hours after initiation of infusion --Daily CBC per protocol  11/27 05/20/2021,4:18  PM

## 2021-05-20 NOTE — Consult Note (Signed)
PHARMACY CONSULT NOTE  Pharmacy Consult for Electrolyte Monitoring and Replacement   Recent Labs: Potassium (mmol/L)  Date Value  05/20/2021 5.9 (H)   Magnesium (mg/dL)  Date Value  46/65/9935 2.4   Calcium (mg/dL)  Date Value  70/17/7939 7.9 (L)   Albumin (g/dL)  Date Value  03/00/9233 1.9 (L)  07/29/2019 4.2   Phosphorus (mg/dL)  Date Value  00/76/2263 4.2   Sodium (mmol/L)  Date Value  05/20/2021 143  07/29/2019 141    Assessment: Patient admitted with shortness of breath. Diagnosed with COVID pneumonia, PE/DVT and hyperkalemia. Patient s/p thrombectomy on 10/14. He has been on Eliquis. Patient required transfer to the ICU 10/18 after rapid response on the floor. He was intubated. Pharmacy consulted to manage electrolytes.  Nutrition: Tube feeds + free water flushes 30 mL q4h (180 mL/day)  Goal of Therapy:  Electrolytes within normal limits  Plan:  --No electrolyte replacement indicated at this time. Worsening Scr noted. K+ to 5.9. Defer management of hyperkalemia to PCCM --Will continue to follow along  [name] 05/20/2021 8:13 AM

## 2021-05-20 NOTE — Progress Notes (Signed)
PHARMACY NOTE:  ANTIMICROBIAL RENAL DOSAGE ADJUSTMENT  Current antimicrobial regimen includes a mismatch between antimicrobial dosage and estimated renal function.  As per policy approved by the Pharmacy & Therapeutics and Medical Executive Committees, the antimicrobial dosage will be adjusted accordingly.  Current antimicrobial dosage:  Zosyn 3.375 g IV q8h (4-hr infusion)  Indication: Pneumonia  Renal Function:  Estimated Creatinine Clearance: 19.9 mL/min (A) (by C-G formula based on SCr of 3.16 mg/dL (H)).    Antimicrobial dosage has been changed to:  Zosyn 2.25 g IV q8h (30-minute infusion)  Additional comments: Worsening AKI. CrCl < 20 mL/min   Thank you for allowing pharmacy to be a part of this patient's care.  [name], [facility] 05/20/2021 4:37 PM

## 2021-05-21 DIAGNOSIS — U071 COVID-19: Secondary | ICD-10-CM | POA: Diagnosis not present

## 2021-05-21 DIAGNOSIS — A419 Sepsis, unspecified organism: Secondary | ICD-10-CM | POA: Diagnosis not present

## 2021-05-21 DIAGNOSIS — N179 Acute kidney failure, unspecified: Secondary | ICD-10-CM

## 2021-05-21 DIAGNOSIS — J189 Pneumonia, unspecified organism: Secondary | ICD-10-CM | POA: Diagnosis not present

## 2021-05-21 DIAGNOSIS — I2699 Other pulmonary embolism without acute cor pulmonale: Secondary | ICD-10-CM | POA: Diagnosis not present

## 2021-05-21 LAB — BASIC METABOLIC PANEL
Anion gap: 7 (ref 5–15)
Anion gap: 13 (ref 5–15)
Anion gap: 8 (ref 5–15)
BUN: 124 mg/dL — ABNORMAL HIGH (ref 8–23)
BUN: 129 mg/dL — ABNORMAL HIGH (ref 8–23)
BUN: 133 mg/dL — ABNORMAL HIGH (ref 8–23)
CO2: 24 mmol/L (ref 22–32)
CO2: 22 mmol/L (ref 22–32)
CO2: 21 mmol/L — ABNORMAL LOW (ref 22–32)
Calcium: 8 mg/dL — ABNORMAL LOW (ref 8.9–10.3)
Calcium: 8.1 mg/dL — ABNORMAL LOW (ref 8.9–10.3)
Calcium: 7.9 mg/dL — ABNORMAL LOW (ref 8.9–10.3)
Chloride: 110 mmol/L (ref 98–111)
Chloride: 107 mmol/L (ref 98–111)
Chloride: 111 mmol/L (ref 98–111)
Creatinine, Ser: 3.77 mg/dL — ABNORMAL HIGH (ref 0.61–1.24)
Creatinine, Ser: 3.9 mg/dL — ABNORMAL HIGH (ref 0.61–1.24)
Creatinine, Ser: 4.12 mg/dL — ABNORMAL HIGH (ref 0.61–1.24)
GFR, Estimated: 15 mL/min — ABNORMAL LOW (ref >60)
GFR, Estimated: 15 mL/min — ABNORMAL LOW (ref >60)
GFR, Estimated: 14 mL/min — ABNORMAL LOW (ref >60)
Glucose, Bld: 161 mg/dL — ABNORMAL HIGH (ref 70–99)
Glucose, Bld: 144 mg/dL — ABNORMAL HIGH (ref 70–99)
Glucose, Bld: 166 mg/dL — ABNORMAL HIGH (ref 70–99)
Potassium: 5.7 mmol/L — ABNORMAL HIGH (ref 3.5–5.1)
Potassium: 6.4 mmol/L (ref 3.5–5.1)
Potassium: 7.5 mmol/L (ref 3.5–5.1)
Sodium: 141 mmol/L (ref 135–145)
Sodium: 142 mmol/L (ref 135–145)
Sodium: 140 mmol/L (ref 135–145)

## 2021-05-21 LAB — HEPARIN INDUCED PLATELET AB (HIT ANTIBODY): Heparin Induced Plt Ab: 2.023 OD — ABNORMAL HIGH (ref 0.000–0.400)

## 2021-05-21 LAB — GLUCOSE, CAPILLARY
Glucose-Capillary: 105 mg/dL — ABNORMAL HIGH (ref 70–99)
Glucose-Capillary: 129 mg/dL — ABNORMAL HIGH (ref 70–99)
Glucose-Capillary: 129 mg/dL — ABNORMAL HIGH (ref 70–99)
Glucose-Capillary: 142 mg/dL — ABNORMAL HIGH (ref 70–99)
Glucose-Capillary: 149 mg/dL — ABNORMAL HIGH (ref 70–99)
Glucose-Capillary: 215 mg/dL — ABNORMAL HIGH (ref 70–99)

## 2021-05-21 LAB — URINALYSIS, ROUTINE W REFLEX MICROSCOPIC
Bilirubin Urine: NEGATIVE
Glucose, UA: NEGATIVE mg/dL
Ketones, ur: NEGATIVE mg/dL
Leukocytes,Ua: NEGATIVE
Nitrite: NEGATIVE
Protein, ur: 100 mg/dL — AB
Specific Gravity, Urine: 1.013 (ref 1.005–1.030)
WBC, UA: NONE SEEN WBC/hpf (ref 0–5)
pH: 5 (ref 5.0–8.0)

## 2021-05-21 LAB — CBC
HCT: 27.1 % — ABNORMAL LOW (ref 39.0–52.0)
Hemoglobin: 8.6 g/dL — ABNORMAL LOW (ref 13.0–17.0)
MCH: 31.9 pg (ref 26.0–34.0)
MCHC: 31.7 g/dL (ref 30.0–36.0)
MCV: 100.4 fL — ABNORMAL HIGH (ref 80.0–100.0)
Platelets: 50 10*3/uL — ABNORMAL LOW (ref 150–400)
RBC: 2.7 MIL/uL — ABNORMAL LOW (ref 4.22–5.81)
RDW: 14.6 % (ref 11.5–15.5)
WBC: 12.7 10*3/uL — ABNORMAL HIGH (ref 4.0–10.5)
nRBC: 0 % (ref 0.0–0.2)

## 2021-05-21 LAB — APTT
aPTT: 72 seconds — ABNORMAL HIGH (ref 24–36)
aPTT: 73 seconds — ABNORMAL HIGH (ref 24–36)
aPTT: 77 seconds — ABNORMAL HIGH (ref 24–36)

## 2021-05-21 LAB — MAGNESIUM: Magnesium: 2.5 mg/dL — ABNORMAL HIGH (ref 1.7–2.4)

## 2021-05-21 LAB — PHOSPHORUS: Phosphorus: 7.3 mg/dL — ABNORMAL HIGH (ref 2.5–4.6)

## 2021-05-21 LAB — POTASSIUM: Potassium: 5.9 mmol/L — ABNORMAL HIGH (ref 3.5–5.1)

## 2021-05-21 LAB — SEROTONIN RELEASE ASSAY (SRA)
SRA .2 IU/mL UFH Ser-aCnc: 82 % — ABNORMAL HIGH (ref 0–20)
SRA 100IU/mL UFH Ser-aCnc: <1 % (ref 0–20)

## 2021-05-21 MED ORDER — CIPROFLOXACIN 500 MG/5ML (10%) PO SUSR
500.0000 mg | Freq: Every day | ORAL | Status: DC
  Filled 2021-05-21 (×2): qty 5

## 2021-05-21 MED ORDER — SODIUM BICARBONATE 8.4 % IV SOLN
50.0000 meq | Freq: Once | INTRAVENOUS | Status: AC
  Administered 2021-05-21: 50 meq via INTRAVENOUS
  Filled 2021-05-21: qty 50

## 2021-05-21 MED ORDER — FUROSEMIDE 10 MG/ML IJ SOLN
120.0000 mg | Freq: Once | INTRAVENOUS | Status: AC
  Administered 2021-05-21: 120 mg via INTRAVENOUS
  Filled 2021-05-21: qty 12

## 2021-05-21 MED ORDER — INSULIN ASPART 100 UNIT/ML IV SOLN
5.0000 [IU] | Freq: Once | INTRAVENOUS | Status: AC
  Administered 2021-05-21: 5 [IU] via INTRAVENOUS
  Filled 2021-05-21: qty 0.05

## 2021-05-21 MED ORDER — SODIUM ZIRCONIUM CYCLOSILICATE 5 G PO PACK
10.0000 g | PACK | Freq: Three times a day (TID) | ORAL | Status: AC
  Administered 2021-05-21 – 2021-05-22 (×4): 10 g
  Filled 2021-05-21 (×4): qty 2

## 2021-05-21 MED ORDER — DEXTROSE 50 % IV SOLN
1.0000 | Freq: Once | INTRAVENOUS | Status: AC
  Administered 2021-05-21: 50 mL via INTRAVENOUS
  Filled 2021-05-21: qty 50

## 2021-05-21 MED ORDER — INSULIN ASPART 100 UNIT/ML IV SOLN
10.0000 [IU] | Freq: Once | INTRAVENOUS | Status: AC
  Administered 2021-05-21: 10 [IU] via INTRAVENOUS
  Filled 2021-05-21: qty 0.1

## 2021-05-21 MED ORDER — CALCIUM GLUCONATE-NACL 1-0.675 GM/50ML-% IV SOLN
1.0000 g | Freq: Once | INTRAVENOUS | Status: AC
  Administered 2021-05-21: 1000 mg via INTRAVENOUS
  Filled 2021-05-21: qty 50

## 2021-05-21 MED ORDER — SEVELAMER CARBONATE 800 MG PO TABS
800.0000 mg | ORAL_TABLET | Freq: Three times a day (TID) | ORAL | Status: DC
  Administered 2021-05-21 – 2021-05-22 (×3): 800 mg
  Filled 2021-05-21 (×3): qty 1

## 2021-05-21 MED ORDER — METHYLPREDNISOLONE SODIUM SUCC 125 MG IJ SOLR
125.0000 mg | Freq: Once | INTRAMUSCULAR | Status: AC
  Administered 2021-05-21: 125 mg via INTRAVENOUS
  Filled 2021-05-21: qty 2

## 2021-05-21 MED ORDER — PREDNISONE 20 MG PO TABS
80.0000 mg | ORAL_TABLET | Freq: Every day | ORAL | Status: DC
  Administered 2021-05-22 – 2021-05-24 (×3): 80 mg
  Filled 2021-05-21 (×3): qty 4

## 2021-05-21 NOTE — Progress Notes (Addendum)
eLink Physician-Brief Progress Note Patient Name: Jonathan Salinas DOB: August 20, 1935 MRN: [id number]   Date of Service  05/21/2021  HPI/Events of Note  K+ 5.9.  eICU Interventions  Patient is already on scheduled bid Lokelma .        Jonathan Salinas 05/21/2021, 5:18 AM

## 2021-05-21 NOTE — Progress Notes (Addendum)
Pt remains on vent.   Sedative rates cut in half for majority of shift. Pt demonstrated some nonpurposeful movement in upper and lower ext, but not following commands or waking up a appropriately.   Towards end of shift, pt became increasingly tachy and guppy breathing on vent. Per Annabelle Harman NP, increase fent and dex.   Pt remains on argatroban gtt, no changes to rate.   Foley remains in place. 800 ml out. Urinalysis sent to lab.    D/t hyperkalemia, pt given 5 units IV insulin, 1x amp d50, Lasix in D5 IVPB.  Q8 BMP's ordered.   No BM.   Pt's son @ bedside this afternoon. See Annabelle Harman Np's note for update regarding POC.   Turn q2. Pt in no acute distress @ this time. Will continue to monitor.

## 2021-05-21 NOTE — Consult Note (Signed)
ANTICOAGULATION CONSULT NOTE  Pharmacy Consult for Argatroban Indication: pulmonary embolus and concern for HIT  Allergies  Allergen Reactions   Heparin Other (See Comments)    Heparin antibody positive; SRA pending   Penicillins     Patient Measurements: Height: 6\' 1"  (185.4 cm) Weight: 79.3 kg (174 lb 13.2 oz) IBW/kg (Calculated) : 79.9   Labs: Recent Labs    05/18/21 0512 05/19/21 0509 05/20/21 0520 05/20/21 1119 05/21/21 0035  HGB 10.7* 9.1* 8.7*  --   --   HCT 33.1* 28.8* 27.7*  --   --   PLT 104* 74* 49*  --   --   APTT 65*  --   --  48* 73*  LABPROT  --   --   --  23.6*  --   INR  --   --   --  2.1*  --   HEPARINUNFRC 0.84*  --   --   --   --   CREATININE 1.30* 1.58* 2.63* 3.16*  --      Estimated Creatinine Clearance: 19.9 mL/min (A) (by C-G formula based on SCr of 3.16 mg/dL (H)).   Medical History: Past Medical History:  Diagnosis Date   GERD (gastroesophageal reflux disease)    Hypertension     Medications:  Patient has been on both apixaban and heparin this admission  Assessment: Patient admitted with shortness of breath. Diagnosed with COVID pneumonia complicated by PE/DVT. Patient s/p thrombectomy on 10/14. Patient required transfer to the ICU 10/18 after rapid response on the floor. He was intubated. Patient remains intubated, sedated and on mechanical ventilation in the ICU. Stay has been complicated by worsening thrombocytopenia while on IV heparin for PE treatment. PF4 antibody indeterminate. SRA pending. 4T score = 5 (intermediate probability). Pharmacy consulted to start argatroban infusion for known PE and suspected HIT.  Labs 10/27: aPTT 48s INR 2.1 Fibrinogen > 800 D-dimer 3.85  Goal of Therapy:  aPTT 50 - 90 seconds Monitor platelets by anticoagulation protocol: Yes   Plan:  10/28:  aPTT = 73, therapeutic X 1  Will continue pt on current rate and recheck aPTT in 4 hrs on 10/28 @ 0500.   Nayef College D 05/21/2021,1:12  AM

## 2021-05-21 NOTE — Consult Note (Signed)
ANTICOAGULATION CONSULT NOTE  Pharmacy Consult for Argatroban Indication: pulmonary embolus and concern for HIT  Allergies  Allergen Reactions   Heparin Other (See Comments)    Heparin antibody positive; SRA pending   Penicillins     Patient Measurements: Height: 6\' 1"  (185.4 cm) Weight: 79.3 kg (174 lb 13.2 oz) IBW/kg (Calculated) : 79.9   Labs: Recent Labs    05/19/21 0509 05/20/21 0520 05/20/21 1119 05/21/21 0035 05/21/21 0412  HGB 9.1* 8.7*  --   --  8.6*  HCT 28.8* 27.7*  --   --  27.1*  PLT 74* 49*  --   --  50*  APTT  --   --  48* 73* 77*  LABPROT  --   --  23.6*  --   --   INR  --   --  2.1*  --   --   CREATININE 1.58* 2.63* 3.16*  --  3.77*     Estimated Creatinine Clearance: 16.7 mL/min (A) (by C-G formula based on SCr of 3.77 mg/dL (H)).   Medical History: Past Medical History:  Diagnosis Date   GERD (gastroesophageal reflux disease)    Hypertension     Medications:  Patient has been on both apixaban and heparin this admission  Assessment: Patient admitted with shortness of breath. Diagnosed with COVID pneumonia complicated by PE/DVT. Patient s/p thrombectomy on 10/14. Patient required transfer to the ICU 10/18 after rapid response on the floor. He was intubated. Patient remains intubated, sedated and on mechanical ventilation in the ICU. Stay has been complicated by worsening thrombocytopenia while on IV heparin for PE treatment. PF4 antibody indeterminate. SRA pending. 4T score = 5 (intermediate probability). Pharmacy consulted to start argatroban infusion for known PE and suspected HIT.  Labs 10/27: aPTT 48s INR 2.1 Fibrinogen > 800 D-dimer 3.85  Goal of Therapy:  aPTT 50 - 90 seconds Monitor platelets by anticoagulation protocol: Yes   Plan:  10/28:  aPTT = 73, therapeutic X 1  Will continue pt on current rate and recheck aPTT in 4 hrs on 10/28 @ 0500.   10/28: aPTT = 77, therapeutic X 2  Will continue pt on current rate and recheck  aPTT on 10/29 with AM labs.   Caylin Nass D 05/21/2021,5:29 AM

## 2021-05-21 NOTE — Progress Notes (Signed)
Discussed pts overall prognosis and decline in renal function with pt likely headed towards requiring dialysis along with inability to wean pt off mechanical ventilation with pts son Jonathan Salinas via telephone.  Jonathan Salinas stated he WOULD NOT want any form of dialysis and will likely proceed with Comfort Care Only on Monday 05/24/2021.  Will continue to monitor and assess pt.   Camie Patience, AGNP  Pulmonary/Critical Care Pager [phone] (please enter 7 digits) PCCM Consult Pager [phone] (please enter 7 digits)

## 2021-05-21 NOTE — Progress Notes (Signed)
NAME:  Jonathan Salinas, MRN:  [id number], DOB:  [birth year], LOS: 18 ADMISSION DATE:  05/09/2021,   History of Present Illness:  85 y.o. Male admitted with Acute Hypoxic Respiratory Failure in the setting of COVID-19 Pneumonia and Pulmonary Embolism.  Underwent mechanical pulmonary thrombectomy on 05/15/2021.   Developed acute respiratory distress and hypoxia on 05/11/21 requiring emergent intubation and mechanical ventilation.  Pseudomonal PNA confirmed on tracheal aspirate, on zosyn.   Pertinent  Medical History  GERD  HTN CKD  Micro Data:  04/26/21: SARS-CoV-2 PCR>> Positive 09-May-2021: Blood culture x2>> no growth 05-09-21: MRSA PCR>> negative 05/11/2021: Tracheal aspirate>>abundant pseudomonas, pansensitive  05/11/2021: Fungal negative   Antimicrobials:  Vancomycin 10/10>>10/11 Cefepime 10/10>>10/12, 10/21>>10/26 (7 day course) Unasyn 10/18>>10/21 Zosyn 10/27 >>   Significant Hospital Events: Including procedures, antibiotic start and stop dates in addition to other pertinent events   May 09, 2021: Pt admitted to ICU with acute hypoxic respiratory failure secondary to COVID-19 and multifocal pneumonia  05/05/21: Hospitalist took over care; Baricitinib started; CTA Chest with PE (right main pulmonary artery and bilateral subsegmental pulmonary arteries); + Right heart strain 05/06/21: Evaluated by Vascular Surgery, plan for Thrombectomy tomorrow 04/27/2021: Underwent Mechanical Thrombectomy of PE by Vascular Surgery; Venous US + for DVT Left posterior tibial veins 05/08/21: Transitioned to Eliquis 05/09/21: Transferred from Stepdown to Med-Surg (room 234) 05/10/21: Pulmonary consulted due to worsening oxygenation 05/11/21: Developed Acute Respiratory Distress and Hypoxia requiring transfer to ICU and EMERGENT INTUBATION, concern for possible aspiration 10/19 severe hypoxia 10/20 severe hypoxia, unable to wean from vent 10/21 Similar vent requirements 10/22 modest improvement in Vent  requirements. Down to 40%, PEEP10 10/23- patient failed SBT with severe aggitation and abnormal vital signs.  10/24 - failed SBT, tachycardia, hypertension. Slight improvement in ventilation requirements. O2 35%, PEEP 5.  10/25 - weaned sedation, did not tolerate - tachypnea, tachycardia, hypertension. Vent setting remain same - O2 35%, PEEP 5.  10/26 - failed weaning of sedation prior to SVT. New fever overnight. Similar Vent settings.  10/27 - Septic. Increased vent requirements. O2- 50%, PEEP 5.  10/28 - goals of care meeting for today in setting worsening kidney function, inability to wean from vent.  Interim History / Subjective:  Off versed gtt. Needed minimal pressors overnight. Not currently on levo, but pressures are soft.  Weaning sedation this AM to check for response and SBT. Goals of care discuss today to determine next steps of care, especially in setting of worsening kidney function.  Spoke with son, Mellody Dance, at bedside with APP Annabelle Harman. Son does not want to proceed with dialysis in setting of kidney failure. Would like to proceed with CMO and compassionate extubation on MONDAY if no change over the weekend.  Objective   Blood pressure 113/70, pulse 89, temperature 99 F (37.2 C), temperature source Axillary, resp. rate (!) 24, height 6\' 1"  (1.854 m), weight 79.3 kg, SpO2 98 %.    Vent Mode: PRVC FiO2 (%):  [50 %] 50 % Set Rate:  [20 bmp] 20 bmp Vt Set:  [500 mL] 500 mL PEEP:  [5 cmH20] 5 cmH20 Plateau Pressure:  [21 cmH20-26 cmH20] 24 cmH20   Intake/Output Summary (Last 24 hours) at 05/21/2021 0750 Last data filed at 05/21/2021 0445 Gross per 24 hour  Intake 3 ml  Output 1025 ml  Net -1022 ml   Filed Weights   05/18/21 0417 05/19/21 0500 05/20/21 0346  Weight: 76.7 kg 77.3 kg 79.3 kg   ROS unable to attain due to patient being intubated  and sedated   Examination: General appearance: 85 y.o., male, intubated, sedated.  Eyes: pupils round, minimally reactive to light,  3 mm bilaterally. Scleredema bilaterally.  HENT: small ulcer on L auricle, dry mucous membranes Lungs: course breath sounds bilaterally, equal chest rise, decreased secretions in line. CV: Sinus tachycardia. Paroxysmal atrial fibrillation. No MRG.  Abdomen: Soft, minimally distended, hypoactive BS  Extremities: New peripheral edema (4+ hands, 2+ legs), radial and DP pulses present bilaterally  Skin: Cold lower legs/feet, turgor and texture; no rash. Skin breakdown overlying dark bruising on sacrum, silicon foam dressing in place.  GU: foley in place.  Neuro: sedated. Does not follow commands. No response to noxious stimuli.   10/19>> respiratory culture -  pansensitive PsA (on cefepime)  10/25 >> CXR - Persistence of bilateral multifocal airspace densities. 10/27>> CXR - No significant change in bilateral opacities  10/27>> renal US - Negative for hydronephrosis. Cortex appears slightly echogenic suggestive of medical renal disease. Enlarged prostate.   Assessment & Plan:  **GOALS OF CARE discussion high priority - continue to pursue treatment vs. Transition to more comfort based care. Family may consider compassionate extubation today.   #Acute hypoxic respiratory failure secondary to COVID-19 associated ARDS, HAP #HAP due to Pseudomonas aeruginosa Date of covid diagnosis: 04/26/21. Admitted 12-May-2021 Date intubated:05/11/21. Course c/b PsA HAP on cx 10/19.  - full vent support - Decreased to RASS -1 - CPT, bronchodilators, D/C TOBIneb - Cefepime 7 day course complete, started zosyn 10/27  - leukocytosis --> WBCs increased again, increased temperature  #Thrombocytopenia  - HIT score intermediate, HIT Ab high-intermediated, stopped heparin gtt, started argatroban. PTT goal 50-90 sec. Followed by pharmacy.  - Platelets seemed to have stabilized today at 50 - Serotonin assay pending  #HYPERKALEMIA  #AKI on CKD -- 10/28>> kidney function continues to worsen with increased Cr, BUN,  decreased GRF and new hyperkalemia. Pending goals of care discussion today with family, will consult nephrology depending on conclusion of discussion.  *No nephrology consult, son does not want to proceed with dialysis* -- Lokelma BID initiated -- 10/27 >>Renal US - Negative for hydronephrosis. Cortex appears slightly echogenic suggestive of medical renal disease. Enlarged prostate.  -- Pharmacy consult, treat electrolytes as needed -- trend electrolytes  -- ensure net negative fluid balance as above  #Septic Shock (resolved) -- Pseudomonal PNA being treated with Zosyn after 7 days of cefepime. Off vasopressors currently, but pressures are soft.    #Submassive Pulmonary Embolism #LLE DVT - s/p mechanical pulmonary thrombectomy on 10/14.  - Eliquis stopped, see above  - Started on argatroban d/t high likelihood of HIT, see above.   Best Practice (right click and "Reselect all SmartList Selections" daily)   Diet/type: tubefeeds DVT prophylaxis: systemic heparin GI prophylaxis: H2B Lines: Central line and yes and it is still needed.  Foley:  Yes, and it is still needed Code Status:  full code Last date of multidisciplinary goals of care discussion [05/14/21]  Ileana Roup PA-Student  [facility]

## 2021-05-21 NOTE — Consult Note (Addendum)
PHARMACY CONSULT NOTE  Pharmacy Consult for Electrolyte Monitoring and Replacement   Recent Labs: Potassium (mmol/L)  Date Value  05/21/2021 5.7 (H)   Magnesium (mg/dL)  Date Value  24/03/7352 2.5 (H)   Calcium (mg/dL)  Date Value  29/92/4268 8.0 (L)   Albumin (g/dL)  Date Value  34/19/6222 1.9 (L)  07/29/2019 4.2   Phosphorus (mg/dL)  Date Value  97/98/9211 7.3 (H)   Sodium (mmol/L)  Date Value  05/21/2021 141  07/29/2019 141    Assessment: Patient admitted with shortness of breath. Diagnosed with COVID pneumonia, PE/DVT and hyperkalemia. Patient s/p thrombectomy on 10/14. He has been on Eliquis. Patient required transfer to the ICU 10/18 after rapid response on the floor. He was intubated. Pharmacy consulted to manage electrolytes.  Nutrition: Tube feeds + free water flushes 30 mL q4h (180 mL/day)  Goal of Therapy:  Electrolytes within normal limits  Plan:  --No electrolyte replacement indicated at this time --Worsening Scr noted. K+ to 5.7. Continue Lokelma 10 g BID --Will continue to follow along  [name] 05/21/2021 7:39 AM

## 2021-05-22 ENCOUNTER — Encounter: Payer: Self-pay | Admitting: Internal Medicine

## 2021-05-22 DIAGNOSIS — J151 Pneumonia due to Pseudomonas: Secondary | ICD-10-CM | POA: Diagnosis not present

## 2021-05-22 DIAGNOSIS — U071 COVID-19: Secondary | ICD-10-CM | POA: Diagnosis not present

## 2021-05-22 DIAGNOSIS — D75829 Heparin-induced thrombocytopenia, unspecified: Secondary | ICD-10-CM | POA: Diagnosis not present

## 2021-05-22 DIAGNOSIS — I2699 Other pulmonary embolism without acute cor pulmonale: Secondary | ICD-10-CM | POA: Diagnosis not present

## 2021-05-22 DIAGNOSIS — J9601 Acute respiratory failure with hypoxia: Secondary | ICD-10-CM | POA: Diagnosis not present

## 2021-05-22 DIAGNOSIS — N179 Acute kidney failure, unspecified: Secondary | ICD-10-CM

## 2021-05-22 DIAGNOSIS — E875 Hyperkalemia: Secondary | ICD-10-CM

## 2021-05-22 LAB — BASIC METABOLIC PANEL
Anion gap: 13 (ref 5–15)
Anion gap: 12 (ref 5–15)
Anion gap: 13 (ref 5–15)
Anion gap: 12 (ref 5–15)
BUN: 153 mg/dL — ABNORMAL HIGH (ref 8–23)
BUN: 166 mg/dL — ABNORMAL HIGH (ref 8–23)
BUN: 159 mg/dL — ABNORMAL HIGH (ref 8–23)
BUN: 151 mg/dL — ABNORMAL HIGH (ref 8–23)
CO2: 20 mmol/L — ABNORMAL LOW (ref 22–32)
CO2: 22 mmol/L (ref 22–32)
CO2: 23 mmol/L (ref 22–32)
CO2: 24 mmol/L (ref 22–32)
Calcium: 8.2 mg/dL — ABNORMAL LOW (ref 8.9–10.3)
Calcium: 8.1 mg/dL — ABNORMAL LOW (ref 8.9–10.3)
Calcium: 8.2 mg/dL — ABNORMAL LOW (ref 8.9–10.3)
Calcium: 8.1 mg/dL — ABNORMAL LOW (ref 8.9–10.3)
Chloride: 107 mmol/L (ref 98–111)
Chloride: 108 mmol/L (ref 98–111)
Chloride: 106 mmol/L (ref 98–111)
Chloride: 107 mmol/L (ref 98–111)
Creatinine, Ser: 4.63 mg/dL — ABNORMAL HIGH (ref 0.61–1.24)
Creatinine, Ser: 4.78 mg/dL — ABNORMAL HIGH (ref 0.61–1.24)
Creatinine, Ser: 4.65 mg/dL — ABNORMAL HIGH (ref 0.61–1.24)
Creatinine, Ser: 4.79 mg/dL — ABNORMAL HIGH (ref 0.61–1.24)
GFR, Estimated: 12 mL/min — ABNORMAL LOW (ref >60)
GFR, Estimated: 11 mL/min — ABNORMAL LOW (ref >60)
GFR, Estimated: 12 mL/min — ABNORMAL LOW (ref >60)
GFR, Estimated: 11 mL/min — ABNORMAL LOW (ref >60)
Glucose, Bld: 159 mg/dL — ABNORMAL HIGH (ref 70–99)
Glucose, Bld: 164 mg/dL — ABNORMAL HIGH (ref 70–99)
Glucose, Bld: 167 mg/dL — ABNORMAL HIGH (ref 70–99)
Glucose, Bld: 270 mg/dL — ABNORMAL HIGH (ref 70–99)
Potassium: 6.5 mmol/L (ref 3.5–5.1)
Potassium: 6.5 mmol/L (ref 3.5–5.1)
Potassium: 6.2 mmol/L — ABNORMAL HIGH (ref 3.5–5.1)
Potassium: 5.8 mmol/L — ABNORMAL HIGH (ref 3.5–5.1)
Sodium: 140 mmol/L (ref 135–145)
Sodium: 142 mmol/L (ref 135–145)
Sodium: 142 mmol/L (ref 135–145)
Sodium: 143 mmol/L (ref 135–145)

## 2021-05-22 LAB — HEMOGLOBIN AND HEMATOCRIT, BLOOD
HCT: 23.8 % — ABNORMAL LOW (ref 39.0–52.0)
HCT: 24.1 % — ABNORMAL LOW (ref 39.0–52.0)
Hemoglobin: 7.8 g/dL — ABNORMAL LOW (ref 13.0–17.0)
Hemoglobin: 7.9 g/dL — ABNORMAL LOW (ref 13.0–17.0)

## 2021-05-22 LAB — CULTURE, RESPIRATORY W GRAM STAIN

## 2021-05-22 LAB — CBC
HCT: 25.4 % — ABNORMAL LOW (ref 39.0–52.0)
Hemoglobin: 8 g/dL — ABNORMAL LOW (ref 13.0–17.0)
MCH: 31 pg (ref 26.0–34.0)
MCHC: 31.5 g/dL (ref 30.0–36.0)
MCV: 98.4 fL (ref 80.0–100.0)
Platelets: 70 10*3/uL — ABNORMAL LOW (ref 150–400)
RBC: 2.58 MIL/uL — ABNORMAL LOW (ref 4.22–5.81)
RDW: 14.5 % (ref 11.5–15.5)
WBC: 16 10*3/uL — ABNORMAL HIGH (ref 4.0–10.5)
nRBC: 0 % (ref 0.0–0.2)

## 2021-05-22 LAB — GLUCOSE, CAPILLARY
Glucose-Capillary: 146 mg/dL — ABNORMAL HIGH (ref 70–99)
Glucose-Capillary: 154 mg/dL — ABNORMAL HIGH (ref 70–99)
Glucose-Capillary: 157 mg/dL — ABNORMAL HIGH (ref 70–99)
Glucose-Capillary: 158 mg/dL — ABNORMAL HIGH (ref 70–99)
Glucose-Capillary: 159 mg/dL — ABNORMAL HIGH (ref 70–99)
Glucose-Capillary: 173 mg/dL — ABNORMAL HIGH (ref 70–99)

## 2021-05-22 LAB — MAGNESIUM: Magnesium: 2.8 mg/dL — ABNORMAL HIGH (ref 1.7–2.4)

## 2021-05-22 LAB — APTT: aPTT: 63 seconds — ABNORMAL HIGH (ref 24–36)

## 2021-05-22 LAB — PHOSPHORUS: Phosphorus: 6 mg/dL — ABNORMAL HIGH (ref 2.5–4.6)

## 2021-05-22 MED ORDER — DEXTROSE 50 % IV SOLN
1.0000 | Freq: Once | INTRAVENOUS | Status: AC
  Administered 2021-05-22: 50 mL via INTRAVENOUS
  Filled 2021-05-22: qty 50

## 2021-05-22 MED ORDER — INSULIN ASPART 100 UNIT/ML IV SOLN
10.0000 [IU] | Freq: Once | INTRAVENOUS | Status: AC
  Administered 2021-05-22: 10 [IU] via INTRAVENOUS
  Filled 2021-05-22: qty 0.1

## 2021-05-22 MED ORDER — ALBUTEROL SULFATE (2.5 MG/3ML) 0.083% IN NEBU: 10.0000 mg | INHALATION_SOLUTION | Freq: Once | RESPIRATORY_TRACT | Status: DC

## 2021-05-22 MED ORDER — MIDAZOLAM HCL 2 MG/2ML IJ SOLN
2.0000 mg | Freq: Once | INTRAMUSCULAR | Status: AC
  Administered 2021-05-22: 2 mg via INTRAVENOUS

## 2021-05-22 MED ORDER — SODIUM BICARBONATE 8.4 % IV SOLN
50.0000 meq | Freq: Once | INTRAVENOUS | Status: AC
  Administered 2021-05-22: 50 meq via INTRAVENOUS
  Filled 2021-05-22: qty 50

## 2021-05-22 MED ORDER — FUROSEMIDE 10 MG/ML IJ SOLN
120.0000 mg | Freq: Once | INTRAVENOUS | Status: AC
  Administered 2021-05-22: 120 mg via INTRAVENOUS
  Filled 2021-05-22: qty 4

## 2021-05-22 MED ORDER — SODIUM ZIRCONIUM CYCLOSILICATE 5 G PO PACK
10.0000 g | PACK | Freq: Every day | ORAL | Status: DC
  Administered 2021-05-23 – 2021-05-24 (×2): 10 g
  Filled 2021-05-22 (×2): qty 2

## 2021-05-22 MED ORDER — CIPROFLOXACIN IN D5W 400 MG/200ML IV SOLN
400.0000 mg | INTRAVENOUS | Status: DC
  Administered 2021-05-22 – 2021-05-24 (×3): 400 mg via INTRAVENOUS
  Filled 2021-05-22 (×3): qty 200

## 2021-05-22 NOTE — Plan of Care (Signed)

## 2021-05-22 NOTE — Progress Notes (Signed)
0800 patient residual 950 ml TF stopped MD at bedside restarted argatroban per MD 1500 patient had large BM blood noted in BM notified CC team sample sent argatroban stopped complete ADL care completed

## 2021-05-22 NOTE — Consult Note (Signed)
ANTICOAGULATION CONSULT NOTE  Pharmacy Consult for Argatroban Indication: pulmonary embolus and concern for HIT  Allergies  Allergen Reactions   Heparin Other (See Comments)    HIT (SRA positive)   Penicillins     Patient Measurements: Height: 6\' 1"  (185.4 cm) Weight: 79.3 kg (174 lb 13.2 oz) IBW/kg (Calculated) : 79.9   Labs: Recent Labs    05/20/21 0520 05/20/21 1119 05/21/21 0035 05/21/21 0412 05/21/21 1251 05/21/21 1549 05/21/21 2042 05/22/21 0431  HGB 8.7*  --   --  8.6*  --   --   --  8.0*  HCT 27.7*  --   --  27.1*  --   --   --  25.4*  PLT 49*  --   --  50*  --   --   --  70*  APTT  --  48*   < > 77*  --  72*  --  63*  LABPROT  --  23.6*  --   --   --   --   --   --   INR  --  2.1*  --   --   --   --   --   --   CREATININE 2.63* 3.16*  --  3.77* 3.90*  --  4.12*  --    < > = values in this interval not displayed.     Estimated Creatinine Clearance: 15.2 mL/min (A) (by C-G formula based on SCr of 4.12 mg/dL (H)).   Medical History: Past Medical History:  Diagnosis Date   GERD (gastroesophageal reflux disease)    Hypertension     Medications:  Patient has been on both apixaban and heparin this admission  Assessment: Patient admitted with shortness of breath. Diagnosed with COVID pneumonia complicated by PE/DVT. Patient s/p thrombectomy on 10/14. Patient required transfer to the ICU 10/18 after rapid response on the floor. He was intubated. Patient remains intubated, sedated and on mechanical ventilation in the ICU. Stay has been complicated by worsening thrombocytopenia while on IV heparin for PE treatment. PF4 antibody indeterminate. SRA pending. 4T score = 5 (intermediate probability). Pharmacy consulted to start argatroban infusion for known PE and suspected HIT.  Labs 10/27: aPTT 48s INR 2.1 Fibrinogen > 800 D-dimer 3.85  Goal of Therapy:  aPTT 50 - 90 seconds Monitor platelets by anticoagulation protocol: Yes   Plan:  10/28:  aPTT = 73,  therapeutic X 1  Will continue pt on current rate and recheck aPTT in 4 hrs on 10/28 @ 0500.   10/28: aPTT = 77, therapeutic X 2  Will continue pt on current rate and recheck aPTT on 10/29 with AM labs.   10/29: aPTT = 63, therapeutic X 3 Will continue pt on current rate and recheck aPTT on 10/30 with AM labs.   Tahra Hitzeman D 05/22/2021,6:07 AM

## 2021-05-22 NOTE — Progress Notes (Signed)
NAME:  Jonathan Salinas, MRN:  [id number], DOB:  14-Aug-1935, LOS: 19 ADMISSION DATE:  05/05/2021,   History of Present Illness:  85 y.o. Male admitted with Acute Hypoxic Respiratory Failure in the setting of COVID-19 Pneumonia and Pulmonary Embolism.  Underwent mechanical pulmonary thrombectomy on 05-08-21.   Developed acute respiratory distress and hypoxia on 05/11/21 requiring emergent intubation and mechanical ventilation.  Pseudomonal PNA confirmed on tracheal aspirate, on zosyn.   Pertinent  Medical History  GERD  HTN CKD  Micro Data:  04/26/21: SARS-CoV-2 PCR>> Positive 04/29/2021: Blood culture x2>> no growth 05/06/2021: MRSA PCR>> negative 05/11/2021: Tracheal aspirate>>+Pseudomonas 05/11/2021: Fungal negative 10/26: Trach aspirate >> +Pseudomonas  Antimicrobials:  Vancomycin 10/10>>10/11 Cefepime 10/10>>10/12, 10/21>>10/26 (7 day course) Unasyn 10/18>>10/21 Zosyn 10/27 >>   Significant Hospital Events: Including procedures, antibiotic start and stop dates in addition to other pertinent events   05/20/2021: Pt admitted to ICU with acute hypoxic respiratory failure secondary to COVID-19 and multifocal pneumonia  05/05/21: Hospitalist took over care; Baricitinib started; CTA Chest with PE (right main pulmonary artery and bilateral subsegmental pulmonary arteries); + Right heart strain 05/06/21: Evaluated by Vascular Surgery, plan for Thrombectomy tomorrow 08-May-2021: Underwent Mechanical Thrombectomy of PE by Vascular Surgery; Venous US + for DVT Left posterior tibial veins 05/08/21: Transitioned to Eliquis 05/09/21: Transferred from Stepdown to Med-Surg (room 234) 05/10/21: Pulmonary consulted due to worsening oxygenation 05/11/21: Developed Acute Respiratory Distress and Hypoxia requiring transfer to ICU and EMERGENT INTUBATION, concern for possible aspiration 10/19 severe hypoxia 10/20 severe hypoxia, unable to wean from vent 10/21 Similar vent requirements 10/22 modest  improvement in Vent requirements. Down to 40%, PEEP10 10/23- patient failed SBT with severe aggitation and abnormal vital signs.  10/24 - failed SBT, tachycardia, hypertension. Slight improvement in ventilation requirements. O2 35%, PEEP 5.  10/25 - weaned sedation, did not tolerate - tachypnea, tachycardia, hypertension. Vent setting remain same - O2 35%, PEEP 5.  10/26 - failed weaning of sedation prior to SVT. New fever overnight. Similar Vent settings.  10/27 - Septic. Increased vent requirements. O2- 50%, PEEP 5.  10/28 - goals of care meeting in setting worsening kidney function, inability to wean from vent. 10/29: SRA confirmatory for HIT  Interim History / Subjective:  SRA confirmatory for HIT. Kidney function continues to worsen as does renal function. This was communicated at length with the patient's son. It was explained that he would likely require dialysis as a lifesaving measure; his son has declined dialysis citing his dad's previously stated wishes. He remains intubated and sedated. Vent settings unchanged as compared to yesterday. He remains tachycardic, afebrile.  Objective   Blood pressure 118/75, pulse (!) 119, temperature 98.1 F (36.7 C), temperature source Axillary, resp. rate (!) 26, height 6\' 1"  (1.854 m), weight 79.3 kg, SpO2 97 %.    Vent Mode: PRVC FiO2 (%):  [50 %] 50 % Set Rate:  [20 bmp] 20 bmp Vt Set:  [500 mL] 500 mL PEEP:  [5 cmH20] 5 cmH20 Plateau Pressure:  [20 cmH20-21 cmH20] 21 cmH20   Intake/Output Summary (Last 24 hours) at 05/22/2021 0830 Last data filed at 05/22/2021 0405 Gross per 24 hour  Intake 3 ml  Output 1430 ml  Net -1427 ml   Filed Weights   05/18/21 0417 05/19/21 0500 05/20/21 0346  Weight: 76.7 kg 77.3 kg 79.3 kg   ROS unable to attain due to patient being intubated and sedated   Examination: General appearance: Caucasian male, ill-appearing, intubated and sedated Lungs: bilateral crackles and  rhonchi CV: tachycardic, no  MRG Abdomen: Soft, mildly distended, +BS Extremities: 2+ pitting edema of BLE GU: foley in place.  Neuro: sedated. Does not follow commands  10/19>> respiratory culture -  pansensitive PsA (on cefepime)  10/25 >> CXR - Persistence of bilateral multifocal airspace densities. 10/27>> CXR - No significant change in bilateral opacities  10/27>> renal US - Negative for hydronephrosis. Cortex appears slightly echogenic suggestive of medical renal disease. Enlarged prostate.   Assessment & Plan:  **GOALS OF CARE** Have discussed with son the patient's clinical condition and prognosis, including multiorgan failure with worsening renal function and potentially fatal electrolyte derangements, along with active infection and coagulopathy due to heparin-induced thrombocytopenia. He understands his father's poor prognosis and has declined dialysis. He would like to wait until Monday prior to transitioning to Comfort Care but understands that he may pass away before that time despite best efforts.  Acute hypoxic respiratory failure secondary to COVID-19 associated ARDS, HAP HAP due to Pseudomonas aeruginosa - full vent support - RASS goal 0 to -1; Precedex, fentanyl - CPT, bronchodilators - Transition Zosyn to cipro per sensitivities - Closely monitor volume status which is worsening; no dialysis per son  AKI, possibly secondary to renovascular thombi in the setting of HIT versus AIN (?Zosyn?, ?cefepime?), ATN Hyperkalemia, hyperphosphatemia - Son has declined dialysis - Argatroban for HIT as below - Prednisone 1 mg/kg for possible AIN, Zosyn discontinued - Frequent BMP; reverse hyperkalemia - Continue Lokelma, sevelamer - EKG does not show peaked T waves  Heparin-induced thrombocytopenia - Continue argatroban, Pharmacy managed - Platelets improving  #Septic Shock (resolved) - Abx for Pseudomonas HAP - Off pressors, MAP goal >65  #Submassive Pulmonary Embolism #LLE DVT - s/p mechanical  pulmonary thrombectomy on 10/14.  - Eliquis stopped, started on argatroban as above  Best Practice (right click and "Reselect all SmartList Selections" daily)   Diet/type: NPO hold tube feeds given exceedingly high gastric residuals DVT prophylaxis: other argatroban GI prophylaxis: H2B Lines: Central line and yes and it is still needed.  Foley:  Yes, and it is still needed Code Status:  DNR Last date of multidisciplinary goals of care discussion [05/21/21]  Overall, patient is critically ill, prognosis is guarded.  Patient with Multiorgan failure and at high risk for cardiac arrest and death.   Critical care time: 40 minutes  Marcelo Baldy, MD 05/22/21 8:30 AM

## 2021-05-22 NOTE — Consult Note (Signed)
PHARMACY CONSULT NOTE  Pharmacy Consult for Electrolyte Monitoring and Replacement   Recent Labs: Potassium (mmol/L)  Date Value  05/22/2021 PENDING   Magnesium (mg/dL)  Date Value  85/27/7824 2.8 (H)   Calcium (mg/dL)  Date Value  23/53/6144 8.2 (L)   Albumin (g/dL)  Date Value  31/54/0086 1.9 (L)  07/29/2019 4.2   Phosphorus (mg/dL)  Date Value  76/19/5093 6.0 (H)   Sodium (mmol/L)  Date Value  05/22/2021 140  07/29/2019 141    Assessment: Patient admitted with shortness of breath. Diagnosed with COVID pneumonia, PE/DVT and hyperkalemia. Patient s/p thrombectomy on 10/14. He has been on Eliquis. Patient required transfer to the ICU 10/18 after rapid response on the floor. He was intubated. Pharmacy consulted to manage electrolytes.  Nutrition: Tube feeds + free water flushes 30 mL q4h (180 mL/day)  Goal of Therapy:  Electrolytes within normal limits  Plan:  --Phos 6.0, on renvela 800 mg TID --Worsening Scr noted. K+ to 6.5. Adjusting Lokelma 10 g TID --Will continue to follow along  Bettey Costa 05/22/2021 11:30 AM

## 2021-05-23 DIAGNOSIS — J9601 Acute respiratory failure with hypoxia: Secondary | ICD-10-CM | POA: Diagnosis not present

## 2021-05-23 DIAGNOSIS — D75829 Heparin-induced thrombocytopenia, unspecified: Secondary | ICD-10-CM | POA: Diagnosis not present

## 2021-05-23 DIAGNOSIS — U071 COVID-19: Secondary | ICD-10-CM | POA: Diagnosis not present

## 2021-05-23 DIAGNOSIS — N179 Acute kidney failure, unspecified: Secondary | ICD-10-CM | POA: Diagnosis not present

## 2021-05-23 LAB — BASIC METABOLIC PANEL
Anion gap: 13 (ref 5–15)
Anion gap: 13 (ref 5–15)
Anion gap: 12 (ref 5–15)
Anion gap: 13 (ref 5–15)
BUN: 156 mg/dL — ABNORMAL HIGH (ref 8–23)
BUN: 162 mg/dL — ABNORMAL HIGH (ref 8–23)
BUN: 158 mg/dL — ABNORMAL HIGH (ref 8–23)
BUN: 179 mg/dL — ABNORMAL HIGH (ref 8–23)
CO2: 23 mmol/L (ref 22–32)
CO2: 24 mmol/L (ref 22–32)
CO2: 25 mmol/L (ref 22–32)
CO2: 23 mmol/L (ref 22–32)
Calcium: 8.1 mg/dL — ABNORMAL LOW (ref 8.9–10.3)
Calcium: 8.2 mg/dL — ABNORMAL LOW (ref 8.9–10.3)
Calcium: 8.1 mg/dL — ABNORMAL LOW (ref 8.9–10.3)
Calcium: 8 mg/dL — ABNORMAL LOW (ref 8.9–10.3)
Chloride: 109 mmol/L (ref 98–111)
Chloride: 108 mmol/L (ref 98–111)
Chloride: 108 mmol/L (ref 98–111)
Chloride: 108 mmol/L (ref 98–111)
Creatinine, Ser: 5.04 mg/dL — ABNORMAL HIGH (ref 0.61–1.24)
Creatinine, Ser: 5.34 mg/dL — ABNORMAL HIGH (ref 0.61–1.24)
Creatinine, Ser: 5.23 mg/dL — ABNORMAL HIGH (ref 0.61–1.24)
Creatinine, Ser: 5.27 mg/dL — ABNORMAL HIGH (ref 0.61–1.24)
GFR, Estimated: 11 mL/min — ABNORMAL LOW (ref >60)
GFR, Estimated: 10 mL/min — ABNORMAL LOW (ref >60)
GFR, Estimated: 10 mL/min — ABNORMAL LOW (ref >60)
GFR, Estimated: 10 mL/min — ABNORMAL LOW (ref >60)
Glucose, Bld: 137 mg/dL — ABNORMAL HIGH (ref 70–99)
Glucose, Bld: 116 mg/dL — ABNORMAL HIGH (ref 70–99)
Glucose, Bld: 170 mg/dL — ABNORMAL HIGH (ref 70–99)
Glucose, Bld: 172 mg/dL — ABNORMAL HIGH (ref 70–99)
Potassium: 6.3 mmol/L (ref 3.5–5.1)
Potassium: 5.8 mmol/L — ABNORMAL HIGH (ref 3.5–5.1)
Potassium: 5.9 mmol/L — ABNORMAL HIGH (ref 3.5–5.1)
Potassium: 5.6 mmol/L — ABNORMAL HIGH (ref 3.5–5.1)
Sodium: 145 mmol/L (ref 135–145)
Sodium: 145 mmol/L (ref 135–145)
Sodium: 145 mmol/L (ref 135–145)
Sodium: 144 mmol/L (ref 135–145)

## 2021-05-23 LAB — CBC
HCT: 23.5 % — ABNORMAL LOW (ref 39.0–52.0)
HCT: 22.7 % — ABNORMAL LOW (ref 39.0–52.0)
Hemoglobin: 7.7 g/dL — ABNORMAL LOW (ref 13.0–17.0)
Hemoglobin: 7.5 g/dL — ABNORMAL LOW (ref 13.0–17.0)
MCH: 31.4 pg (ref 26.0–34.0)
MCH: 31.8 pg (ref 26.0–34.0)
MCHC: 32.8 g/dL (ref 30.0–36.0)
MCHC: 33 g/dL (ref 30.0–36.0)
MCV: 95.9 fL (ref 80.0–100.0)
MCV: 96.2 fL (ref 80.0–100.0)
Platelets: 99 10*3/uL — ABNORMAL LOW (ref 150–400)
Platelets: 100 10*3/uL — ABNORMAL LOW (ref 150–400)
RBC: 2.45 MIL/uL — ABNORMAL LOW (ref 4.22–5.81)
RBC: 2.36 MIL/uL — ABNORMAL LOW (ref 4.22–5.81)
RDW: 14.7 % (ref 11.5–15.5)
RDW: 14.9 % (ref 11.5–15.5)
WBC: 18.8 10*3/uL — ABNORMAL HIGH (ref 4.0–10.5)
WBC: 17.5 10*3/uL — ABNORMAL HIGH (ref 4.0–10.5)
nRBC: 0 % (ref 0.0–0.2)
nRBC: 0 % (ref 0.0–0.2)

## 2021-05-23 LAB — HEMOGLOBIN AND HEMATOCRIT, BLOOD
HCT: 20.7 % — ABNORMAL LOW (ref 39.0–52.0)
HCT: 24.2 % — ABNORMAL LOW (ref 39.0–52.0)
Hemoglobin: 6.9 g/dL — ABNORMAL LOW (ref 13.0–17.0)
Hemoglobin: 7.9 g/dL — ABNORMAL LOW (ref 13.0–17.0)

## 2021-05-23 LAB — ABO/RH: ABO/RH(D): A POS

## 2021-05-23 LAB — BLOOD GAS, VENOUS
Acid-Base Excess: 2.2 mmol/L — ABNORMAL HIGH (ref 0.0–2.0)
Bicarbonate: 27.3 mmol/L (ref 20.0–28.0)
FIO2: 0.3
MECHVT: 500 mL
Mechanical Rate: 20
O2 Saturation: 92.7 %
PEEP: 5 cmH2O
Patient temperature: 37
pCO2, Ven: 44 mmHg (ref 44.0–60.0)
pH, Ven: 7.4 (ref 7.250–7.430)
pO2, Ven: 66 mmHg — ABNORMAL HIGH (ref 32.0–45.0)

## 2021-05-23 LAB — PHOSPHORUS: Phosphorus: 5.8 mg/dL — ABNORMAL HIGH (ref 2.5–4.6)

## 2021-05-23 LAB — PREPARE RBC (CROSSMATCH)

## 2021-05-23 LAB — GLUCOSE, CAPILLARY
Glucose-Capillary: 128 mg/dL — ABNORMAL HIGH (ref 70–99)
Glucose-Capillary: 106 mg/dL — ABNORMAL HIGH (ref 70–99)
Glucose-Capillary: 149 mg/dL — ABNORMAL HIGH (ref 70–99)
Glucose-Capillary: 145 mg/dL — ABNORMAL HIGH (ref 70–99)
Glucose-Capillary: 167 mg/dL — ABNORMAL HIGH (ref 70–99)
Glucose-Capillary: 122 mg/dL — ABNORMAL HIGH (ref 70–99)

## 2021-05-23 LAB — APTT: aPTT: 57 seconds — ABNORMAL HIGH (ref 24–36)

## 2021-05-23 LAB — MAGNESIUM: Magnesium: 2.7 mg/dL — ABNORMAL HIGH (ref 1.7–2.4)

## 2021-05-23 LAB — OCCULT BLOOD X 1 CARD TO LAB, STOOL: Fecal Occult Bld: POSITIVE — AB

## 2021-05-23 MED ORDER — DEXTROSE 50 % IV SOLN
1.0000 | Freq: Once | INTRAVENOUS | Status: AC
  Administered 2021-05-23: 50 mL via INTRAVENOUS
  Filled 2021-05-23: qty 50

## 2021-05-23 MED ORDER — INSULIN ASPART 100 UNIT/ML IV SOLN
5.0000 [IU] | Freq: Once | INTRAVENOUS | Status: AC
  Administered 2021-05-23: 5 [IU] via INTRAVENOUS
  Filled 2021-05-23: qty 0.05

## 2021-05-23 MED ORDER — SODIUM CHLORIDE 0.9% IV SOLUTION: Freq: Once | INTRAVENOUS | Status: AC

## 2021-05-23 MED ORDER — INSULIN ASPART 100 UNIT/ML IV SOLN
10.0000 [IU] | Freq: Once | INTRAVENOUS | Status: AC
  Administered 2021-05-23: 10 [IU] via INTRAVENOUS
  Filled 2021-05-23: qty 0.1

## 2021-05-23 MED ORDER — LEVALBUTEROL HCL 0.63 MG/3ML IN NEBU
0.6300 mg | INHALATION_SOLUTION | Freq: Four times a day (QID) | RESPIRATORY_TRACT | Status: DC | PRN
  Filled 2021-05-23: qty 3

## 2021-05-23 MED ORDER — FUROSEMIDE 10 MG/ML IJ SOLN
120.0000 mg | Freq: Once | INTRAVENOUS | Status: AC
  Administered 2021-05-23: 120 mg via INTRAVENOUS
  Filled 2021-05-23: qty 12

## 2021-05-23 MED ORDER — PANTOPRAZOLE SODIUM 40 MG IV SOLR
40.0000 mg | Freq: Two times a day (BID) | INTRAVENOUS | Status: DC
  Administered 2021-05-23 – 2021-05-24 (×3): 40 mg via INTRAVENOUS
  Filled 2021-05-23 (×3): qty 40

## 2021-05-23 MED ORDER — CALCIUM GLUCONATE-NACL 1-0.675 GM/50ML-% IV SOLN
1.0000 g | Freq: Once | INTRAVENOUS | Status: AC
  Administered 2021-05-23: 1000 mg via INTRAVENOUS
  Filled 2021-05-23: qty 50

## 2021-05-23 MED ORDER — SODIUM CHLORIDE 0.9 % IV SOLN
20.0000 ug | Freq: Once | INTRAVENOUS | Status: AC
  Administered 2021-05-23: 20 ug via INTRAVENOUS
  Filled 2021-05-23: qty 5

## 2021-05-23 NOTE — Plan of Care (Signed)
Argatroban infusion discontinued due to continued lower GI bleeding requiring blood products.  Marcelo Baldy, MD 05/23/21 4:15 PM

## 2021-05-23 NOTE — Progress Notes (Signed)
NAME:  Jonathan Salinas, MRN:  [id number], DOB:  1936-07-20, LOS: 20 ADMISSION DATE:  05/02/2021,   History of Present Illness:  85 y.o. Male admitted with Acute Hypoxic Respiratory Failure in the setting of COVID-19 Pneumonia and Pulmonary Embolism.  Underwent mechanical pulmonary thrombectomy on 2021/05/24.   Developed acute respiratory distress and hypoxia on 05/11/21 requiring emergent intubation and mechanical ventilation.  Pseudomonal PNA confirmed on tracheal aspirate, on zosyn.   Pertinent  Medical History  GERD  HTN CKD  Micro Data:  04/26/21: SARS-CoV-2 PCR>> Positive 05/10/2021: Blood culture x2>> no growth 05/16/2021: MRSA PCR>> negative 05/11/2021: Tracheal aspirate>>+Pseudomonas 05/11/2021: Fungal negative 10/26: Trach aspirate >> +Pseudomonas  Antimicrobials:  Vancomycin 10/10>>10/11 Cefepime 10/10>>10/12, 10/21>>10/26 (7 day course) Unasyn 10/18>>10/21 Zosyn 10/27 >>   Significant Hospital Events: Including procedures, antibiotic start and stop dates in addition to other pertinent events   05/01/2021: Pt admitted to ICU with acute hypoxic respiratory failure secondary to COVID-19 and multifocal pneumonia  05/05/21: Hospitalist took over care; Baricitinib started; CTA Chest with PE (right main pulmonary artery and bilateral subsegmental pulmonary arteries); + Right heart strain 05/06/21: Evaluated by Vascular Surgery, plan for Thrombectomy tomorrow 05/24/21: Underwent Mechanical Thrombectomy of PE by Vascular Surgery; Venous US + for DVT Left posterior tibial veins 05/08/21: Transitioned to Eliquis 05/09/21: Transferred from Stepdown to Med-Surg (room 234) 05/10/21: Pulmonary consulted due to worsening oxygenation 05/11/21: Developed Acute Respiratory Distress and Hypoxia requiring transfer to ICU and EMERGENT INTUBATION, concern for possible aspiration 10/19 severe hypoxia 10/20 severe hypoxia, unable to wean from vent 10/21 Similar vent requirements 10/22 modest  improvement in Vent requirements. Down to 40%, PEEP10 10/23- patient failed SBT with severe aggitation and abnormal vital signs.  10/24 - failed SBT, tachycardia, hypertension. Slight improvement in ventilation requirements. O2 35%, PEEP 5.  10/25 - weaned sedation, did not tolerate - tachypnea, tachycardia, hypertension. Vent setting remain same - O2 35%, PEEP 5.  10/26 - failed weaning of sedation prior to SVT. New fever overnight. Similar Vent settings.  10/27 - Septic. Increased vent requirements. O2- 50%, PEEP 5.  10/28 - goals of care meeting in setting worsening kidney function, inability to wean from vent. 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 Renal function continues to worsen. Potassium remains persistently elevated. He continues to make urine, however, UOP slowed over last 12 hours. Afebrile, persistent leukocytosis.   Objective   Blood pressure 140/72, pulse (!) 103, temperature 97.7 F (36.5 C), temperature source Axillary, resp. rate (!) 24, height 6\' 1"  (1.854 m), weight 79.3 kg, SpO2 95 %.    Vent Mode: PRVC FiO2 (%):  [40 %] 40 % Set Rate:  [20 bmp] 20 bmp Vt Set:  [500 mL] 500 mL PEEP:  [5 cmH20] 5 cmH20 Plateau Pressure:  [18 cmH20-23 cmH20] 22 cmH20   Intake/Output Summary (Last 24 hours) at 05/23/2021 1018 Last data filed at 05/23/2021 0853 Gross per 24 hour  Intake 786.2 ml  Output 1651 ml  Net -864.8 ml   Filed Weights   05/18/21 0417 05/19/21 0500 05/20/21 0346  Weight: 76.7 kg 77.3 kg 79.3 kg   ROS unable to attain due to patient being intubated and sedated   Examination: General appearance: Caucasian male, ill-appearing, intubated and sedated Lungs: bilateral crackles and rhonchi CV: tachycardic, no MRG Abdomen: Soft, mildly distended, +BS Extremities: 2+ pitting edema of BLE GU: foley in place.  Neuro: sedated. Does not follow commands  10/19>> respiratory  culture -   pansensitive PsA (on cefepime)  10/25 >> CXR - Persistence of bilateral multifocal airspace densities. 10/27>> CXR - No significant change in bilateral opacities  10/27>> renal US - Negative for hydronephrosis. Cortex appears slightly echogenic suggestive of medical renal disease. Enlarged prostate.   Assessment & Plan:  **GOALS OF CARE** Have discussed with son the patient's clinical condition and prognosis, including multiorgan failure with worsening renal function and potentially fatal electrolyte derangements, along with active infection and coagulopathy due to heparin-induced thrombocytopenia. He understands his father's poor prognosis and has declined dialysis. He would like to wait until Monday prior to transitioning to Comfort Care but understands that he may pass away before that time despite best efforts.  Acute hypoxic respiratory failure secondary to COVID-19 associated ARDS, HAP HAP due to Pseudomonas aeruginosa - full vent support - RASS goal 0 to -1; Precedex, fentanyl - CPT, bronchodilators - Continue cipro - Closely monitor volume status which is worsening; no dialysis per son  AKI, possibly secondary to renovascular thombi in the setting of HIT versus AIN (?Zosyn?, ?cefepime?), ATN Hyperkalemia, hyperphosphatemia - Son has declined dialysis - Argatroban for HIT as below - Prednisone 1 mg/kg for possible AIN, Zosyn/cefepime discontinued - Frequent BMP; reverse hyperkalemia - Continue Lokelma, sevelamer  Heparin-induced thrombocytopenia Hematochezia - Continue argatroban as able, Pharmacy managed - Trend H&H, xfuse for Hgb <7 - Platelets improving  #Septic Shock (resolved) - Abx for Pseudomonas HAP - Off pressors, MAP goal >65  #Submassive Pulmonary Embolism #LLE DVT - s/p mechanical pulmonary thrombectomy on 10/14.  - Eliquis stopped, started on argatroban as above  Best Practice (right click and "Reselect all SmartList Selections" daily)   Diet/type: NPO  hold tube feeds given high gastric residuals, hematochezia DVT prophylaxis: other argatroban GI prophylaxis: H2B Lines: Central line and yes and it is still needed.  Foley:  Yes, and it is still needed Code Status:  DNR Last date of multidisciplinary goals of care discussion [05/21/21]  Overall, patient is critically ill, prognosis is guarded.  Patient with Multiorgan failure and at high risk for cardiac arrest and death.   Critical care time: 33 minutes  Marcelo Baldy, MD 05/23/21 10:18 AM

## 2021-05-23 NOTE — Consult Note (Signed)
PHARMACY CONSULT NOTE  Pharmacy Consult for Electrolyte Monitoring and Replacement   Recent Labs: Potassium (mmol/L)  Date Value  05/23/2021 5.8 (H)   Magnesium (mg/dL)  Date Value  44/81/8563 2.7 (H)   Calcium (mg/dL)  Date Value  14/97/0263 8.2 (L)   Albumin (g/dL)  Date Value  78/58/8502 1.9 (L)  07/29/2019 4.2   Phosphorus (mg/dL)  Date Value  77/41/2878 5.8 (H)   Sodium (mmol/L)  Date Value  05/23/2021 145  07/29/2019 141    Assessment: Patient admitted with shortness of breath. Diagnosed with COVID pneumonia, PE/DVT and hyperkalemia. Patient s/p thrombectomy on 10/14. He has been on Eliquis. Patient required transfer to the ICU 10/18 after rapid response on the floor. He was intubated. Pharmacy consulted to manage electrolytes.  Nutrition: Tube feeds + free water flushes 30 mL q4h (180 mL/day)  Goal of Therapy:  Electrolytes within normal limits  Plan:  --K 5.8, order placed for lokelma daily, lasix 120mg  IV x 1 ordered --Phos 5.8, trending down, on renvela 800 mg TID --Will continue to follow along  05/23/2021 9:39 AM

## 2021-05-23 NOTE — Consult Note (Signed)
ANTICOAGULATION CONSULT NOTE  Pharmacy Consult for Argatroban Indication: pulmonary embolus and concern for HIT  Allergies  Allergen Reactions   Heparin Other (See Comments)    HIT (SRA positive)   Penicillins     Patient Measurements: Height: 6\' 1"  (185.4 cm) Weight: 79.3 kg (174 lb 13.2 oz) IBW/kg (Calculated) : 79.9   Labs: Recent Labs    05/20/21 1119 05/21/21 0035 05/21/21 0412 05/21/21 1251 05/21/21 1549 05/21/21 2042 05/22/21 0431 05/22/21 1130 05/22/21 1751 05/22/21 2042 05/23/21 0254  HGB  --   --  8.6*  --   --   --  8.0* 7.8* 7.9*  --  7.7*  HCT  --   --  27.1*  --   --   --  25.4* 23.8* 24.1*  --  23.5*  PLT  --   --  50*  --   --   --  70*  --   --   --  99*  APTT 48*   < > 77*  --  72*  --  63*  --   --   --  57*  LABPROT 23.6*  --   --   --   --   --   --   --   --   --   --   INR 2.1*  --   --   --   --   --   --   --   --   --   --   CREATININE 3.16*  --  3.77*   < >  --    < > 4.63* 4.78* 4.65* 4.79* 5.04*   < > = values in this interval not displayed.     Estimated Creatinine Clearance: 12.5 mL/min (A) (by C-G formula based on SCr of 5.04 mg/dL (H)).   Medical History: Past Medical History:  Diagnosis Date   GERD (gastroesophageal reflux disease)    Hypertension     Medications:  Patient has been on both apixaban and heparin this admission  Assessment: Patient admitted with shortness of breath. Diagnosed with COVID pneumonia complicated by PE/DVT. Patient s/p thrombectomy on 10/14. Patient required transfer to the ICU 10/18 after rapid response on the floor. He was intubated. Patient remains intubated, sedated and on mechanical ventilation in the ICU. Stay has been complicated by worsening thrombocytopenia while on IV heparin for PE treatment. PF4 antibody indeterminate. SRA pending. 4T score = 5 (intermediate probability). Pharmacy consulted to start argatroban infusion for known PE and suspected HIT.  Labs 10/27: aPTT 48s INR  2.1 Fibrinogen > 800 D-dimer 3.85  Goal of Therapy:  aPTT 50 - 90 seconds Monitor platelets by anticoagulation protocol: Yes   Plan:  10/28:  aPTT = 73, therapeutic X 1  Will continue pt on current rate and recheck aPTT in 4 hrs on 10/28 @ 0500.   10/28: aPTT = 77, therapeutic X 2  Will continue pt on current rate and recheck aPTT on 10/29 with AM labs.   10/29: aPTT = 63, therapeutic X 3 Will continue pt on current rate and recheck aPTT on 10/30 with AM labs.   10/30: aPTT @ 0254 = 57, therapeutic X 4  Will continue pt on current rate and recheck aPTT on 10/31 with AM Labs.   Chapman Matteucci D 05/23/2021,5:03 AM

## 2021-05-24 DIAGNOSIS — J189 Pneumonia, unspecified organism: Secondary | ICD-10-CM | POA: Diagnosis not present

## 2021-05-24 DIAGNOSIS — U071 COVID-19: Secondary | ICD-10-CM | POA: Diagnosis not present

## 2021-05-24 DIAGNOSIS — Z515 Encounter for palliative care: Secondary | ICD-10-CM

## 2021-05-24 LAB — TYPE AND SCREEN
ABO/RH(D): A POS
Antibody Screen: NEGATIVE
Unit division: 0
Unit division: 0

## 2021-05-24 LAB — MAGNESIUM: Magnesium: 2.9 mg/dL — ABNORMAL HIGH (ref 1.7–2.4)

## 2021-05-24 LAB — BPAM RBC
Blood Product Expiration Date: 202211212359
Blood Product Expiration Date: 202211292359
ISSUE DATE / TIME: 202210301332
ISSUE DATE / TIME: 202210301704
Unit Type and Rh: 6200
Unit Type and Rh: 6200

## 2021-05-24 LAB — BASIC METABOLIC PANEL
Anion gap: 13 (ref 5–15)
BUN: 166 mg/dL — ABNORMAL HIGH (ref 8–23)
CO2: 23 mmol/L (ref 22–32)
Calcium: 8.2 mg/dL — ABNORMAL LOW (ref 8.9–10.3)
Chloride: 108 mmol/L (ref 98–111)
Creatinine, Ser: 5.47 mg/dL — ABNORMAL HIGH (ref 0.61–1.24)
GFR, Estimated: 10 mL/min — ABNORMAL LOW (ref >60)
Glucose, Bld: 165 mg/dL — ABNORMAL HIGH (ref 70–99)
Potassium: 5.3 mmol/L — ABNORMAL HIGH (ref 3.5–5.1)
Sodium: 144 mmol/L (ref 135–145)

## 2021-05-24 LAB — HEMOGLOBIN AND HEMATOCRIT, BLOOD
HCT: 29.1 % — ABNORMAL LOW (ref 39.0–52.0)
Hemoglobin: 9.7 g/dL — ABNORMAL LOW (ref 13.0–17.0)

## 2021-05-24 LAB — PHOSPHORUS: Phosphorus: 7.7 mg/dL — ABNORMAL HIGH (ref 2.5–4.6)

## 2021-05-24 LAB — APTT: aPTT: 35 seconds (ref 24–36)

## 2021-05-24 LAB — GLUCOSE, CAPILLARY
Glucose-Capillary: 140 mg/dL — ABNORMAL HIGH (ref 70–99)
Glucose-Capillary: 124 mg/dL — ABNORMAL HIGH (ref 70–99)

## 2021-05-24 MED ORDER — POLYVINYL ALCOHOL 1.4 % OP SOLN
1.0000 [drp] | Freq: Four times a day (QID) | OPHTHALMIC | Status: DC | PRN
  Filled 2021-05-24: qty 15

## 2021-05-24 MED ORDER — MIDAZOLAM HCL 2 MG/2ML IJ SOLN
2.0000 mg | INTRAMUSCULAR | Status: DC | PRN
  Administered 2021-05-24: 19:00:00 2 mg via INTRAVENOUS
  Filled 2021-05-24: qty 2

## 2021-05-24 MED ORDER — BIOTENE DRY MOUTH MT LIQD: 15.0000 mL | OROMUCOSAL | Status: DC | PRN

## 2021-05-24 MED ORDER — ACETAMINOPHEN 650 MG RE SUPP: 650.0000 mg | Freq: Four times a day (QID) | RECTAL | Status: DC | PRN

## 2021-05-24 MED ORDER — LORAZEPAM 2 MG/ML IJ SOLN
1.0000 mg | INTRAMUSCULAR | Status: DC | PRN
  Administered 2021-05-24 (×3): 2 mg via INTRAVENOUS
  Filled 2021-05-24 (×3): qty 1

## 2021-05-24 MED ORDER — HALOPERIDOL LACTATE 5 MG/ML IJ SOLN
0.5000 mg | INTRAMUSCULAR | Status: DC | PRN
  Administered 2021-05-24: 0.5 mg via INTRAVENOUS
  Filled 2021-05-24: qty 1

## 2021-05-24 MED ORDER — GLYCOPYRROLATE 0.2 MG/ML IJ SOLN
0.2000 mg | INTRAMUSCULAR | Status: DC | PRN
  Administered 2021-05-24 (×4): 0.2 mg via INTRAVENOUS
  Filled 2021-05-24 (×4): qty 1

## 2021-05-24 MED ORDER — MORPHINE 100MG IN NS 100ML (1MG/ML) PREMIX INFUSION
1.0000 mg/h | INTRAVENOUS | Status: DC
  Administered 2021-05-24: 1 mg/h via INTRAVENOUS
  Administered 2021-05-24 – 2021-05-25 (×2): 15 mg/h via INTRAVENOUS
  Filled 2021-05-24 (×3): qty 100

## 2021-05-24 MED ORDER — MORPHINE BOLUS VIA INFUSION
1.0000 mg | INTRAVENOUS | Status: DC | PRN
  Administered 2021-05-24 (×3): 2 mg via INTRAVENOUS
  Administered 2021-05-24: 4 mg via INTRAVENOUS
  Administered 2021-05-24 (×2): 2 mg via INTRAVENOUS
  Administered 2021-05-24: 4 mg via INTRAVENOUS
  Administered 2021-05-24: 2 mg via INTRAVENOUS
  Filled 2021-05-24: qty 4

## 2021-05-24 NOTE — Consult Note (Signed)
ANTICOAGULATION CONSULT NOTE  Pharmacy Consult for Argatroban Indication: pulmonary embolus and concern for HIT  Allergies  Allergen Reactions   Heparin Other (See Comments)    HIT (SRA positive)   Penicillins     Patient Measurements: Height: 6\' 1"  (185.4 cm) Weight: 79.1 kg (174 lb 6.1 oz) IBW/kg (Calculated) : 79.9   Labs: Recent Labs    05/22/21 0431 05/22/21 1130 05/23/21 0254 05/23/21 0815 05/23/21 1008 05/23/21 1636 05/23/21 2103 05/24/21 0017 05/24/21 0313  HGB 8.0*   < > 7.7* 7.5* 6.9* 7.9*  --  9.7*  --   HCT 25.4*   < > 23.5* 22.7* 20.7* 24.2*  --  29.1*  --   PLT 70*  --  99* 100*  --   --   --   --   --   APTT 63*  --  57*  --   --   --   --   --  35  CREATININE 4.63*   < > 5.04* 5.34*  --  5.23* 5.27*  --  5.47*   < > = values in this interval not displayed.     Estimated Creatinine Clearance: 11.4 mL/min (A) (by C-G formula based on SCr of 5.47 mg/dL (H)).   Medical History: Past Medical History:  Diagnosis Date   GERD (gastroesophageal reflux disease)    Hypertension     Medications:  Patient has been on both apixaban and heparin this admission  Assessment: Patient admitted with shortness of breath. Diagnosed with COVID pneumonia complicated by PE/DVT. Patient s/p thrombectomy on 10/14. Patient required transfer to the ICU 10/18 after rapid response on the floor. He was intubated. Patient remains intubated, sedated and on mechanical ventilation in the ICU. Stay has been complicated by worsening thrombocytopenia while on IV heparin for PE treatment. PF4 antibody indeterminate. SRA pending. 4T score = 5 (intermediate probability). Pharmacy consulted to start argatroban infusion for known PE and suspected HIT.  Labs 10/27: aPTT 48s INR 2.1 Fibrinogen > 800 D-dimer 3.85  Goal of Therapy:  aPTT 50 - 90 seconds Monitor platelets by anticoagulation protocol: Yes   Plan:  10/28:  aPTT = 73, therapeutic X 1  Will continue pt on current rate  and recheck aPTT in 4 hrs on 10/28 @ 0500.   10/28: aPTT = 77, therapeutic X 2  Will continue pt on current rate and recheck aPTT on 10/29 with AM labs.   10/29: aPTT = 63, therapeutic X 3 Will continue pt on current rate and recheck aPTT on 10/30 with AM labs.   10/30: aPTT @ 0254 = 57, therapeutic X 4  Will continue pt on current rate and recheck aPTT on 10/31 with AM Labs.   10/30:  Argatroban infusion cancelled due to concern about lower GI bleeding.  Will need to f/u with MD for plans to restart argatroban   Emagene Merfeld D 05/24/2021,5:14 AM

## 2021-05-24 NOTE — Progress Notes (Signed)
Nutrition Brief Note  Chart reviewed. Case discussed with MD, RN, and during ICU rounds. Pt now transitioning to comfort care.  No further nutrition interventions planned at this time.  Please re-consult as needed.   Levada Schilling, RD, LDN, CDCES Registered Dietitian II Certified Diabetes Care and Education Specialist Please refer to [facility] for RD and/or RD on-call/weekend/after hours pager

## 2021-05-24 NOTE — Progress Notes (Signed)
Discussed plan of care with pts son Aycen Porreca who has arrived at pts bedside, and he has decided to transition pt to Comfort Measures Only.  Therefore, will place orders to transition to Comfort Care.  Will continue to monitor and assess pt.   Camie Patience, AGNP  Pulmonary/Critical Care Pager [phone] (please enter 7 digits) PCCM Consult Pager [phone] (please enter 7 digits)

## 2021-05-24 NOTE — Progress Notes (Addendum)
1105 patient extubated to comfort care, son Mellody Dance signed release of body after death son went home states he doesn't want to see patient go through this transition.  [phone] called son to update on room change

## 2021-05-24 NOTE — Progress Notes (Signed)
     Informed of previous PMT colleague of plans for compassionate extubation today. Spoke with RN and NP who confirmed plans for extubation, PCCM to manage. Denies any PMT needs at this time.  Will continue to shadow the chart for any development of needs during the comfort care period.  Thank you for your referral and allowing PMT to assist in Jonathan Salinas's care.   Wynne Dust, NP Palliative Medicine Team Phone: [phone]  NO CHARGE

## 2021-05-24 NOTE — TOC Progression Note (Addendum)
Transition of Care [facility]) - Progression Note    Patient Details  Name: Jonathan Salinas MRN: [id number] Date of Birth: 1936/05/05  [facility]) CM/SW Contact  Joseph Art, [place] Phone Number: 05/24/2021, 9:14 AM  Clinical Narrative:     Critical Care NP discussed goals of care with patient's son Cordaryl Decelles on 05/21/2021.  Plan is to proceed with comfort care measure today 05/24/2021.   Expected Discharge Plan: Home/Self Care Barriers to Discharge: Continued Medical Work up  Expected Discharge Plan and Services Expected Discharge Plan: Home/Self Care In-house Referral: Clinical Social Work     Living arrangements for the past 2 months: Single Family Home                                       Social Determinants of Health (SDOH) Interventions    Readmission Risk Interventions No flowsheet data found.

## 2021-05-24 NOTE — Progress Notes (Signed)
NAME:  Jonathan Salinas, MRN:  [id number], DOB:  [birth year], LOS: 21 ADMISSION DATE:  05/01/2021,   History of Present Illness:  85 y.o. Male admitted with Acute Hypoxic Respiratory Failure in the setting of COVID-19 Pneumonia and Pulmonary Embolism.  Underwent mechanical pulmonary thrombectomy on 05/22/2021.   Developed acute respiratory distress and hypoxia on 05/11/21 requiring emergent intubation and mechanical ventilation.  Pseudomonal PNA confirmed on tracheal aspirate, zosyn discontinued d/t possible cause of worsening renal function, started on cipro.   Pertinent  Medical History  GERD  HTN CKD  Micro Data:  04/26/21: SARS-CoV-2 PCR>> Positive 05/10/2021: Blood culture x2>> no growth 05/19/2021: MRSA PCR>> negative 05/11/2021: Tracheal aspirate>>+Pseudomonas 05/11/2021: Fungal negative 10/26: Trach aspirate >> +Pseudomonas  Antimicrobials:  Vancomycin 10/10>>10/11 Cefepime 10/10>>10/12, 10/21>>10/26 (7 day course) Unasyn 10/18>>10/21 Zosyn 10/27 >> 10/29  Cirpofloxacin 10/29   Significant Hospital Events: Including procedures, antibiotic start and stop dates in addition to other pertinent events   04/27/2021: Pt admitted to ICU with acute hypoxic respiratory failure secondary to COVID-19 and multifocal pneumonia  05/05/21: Hospitalist took over care; Baricitinib started; CTA Chest with PE (right main pulmonary artery and bilateral subsegmental pulmonary arteries); + Right heart strain 05/06/21: Evaluated by Vascular Surgery, plan for Thrombectomy tomorrow 05/15/2021: Underwent Mechanical Thrombectomy of PE by Vascular Surgery; Venous US + for DVT Left posterior tibial veins 05/08/21: Transitioned to Eliquis 05/09/21: Transferred from Stepdown to Med-Surg (room 234) 05/10/21: Pulmonary consulted due to worsening oxygenation 05/11/21: Developed Acute Respiratory Distress and Hypoxia requiring transfer to ICU and EMERGENT INTUBATION, concern for possible aspiration 10/19 severe  hypoxia 10/20 severe hypoxia, unable to wean from vent 10/21 Similar vent requirements 10/22 modest improvement in Vent requirements. Down to 40%, PEEP10 10/23- patient failed SBT with severe aggitation and abnormal vital signs.  10/24 - failed SBT, tachycardia, hypertension. Slight improvement in ventilation requirements. O2 35%, PEEP 5.  10/25 - weaned sedation, did not tolerate - tachypnea, tachycardia, hypertension. Vent setting remain same - O2 35%, PEEP 5.  10/26 - failed weaning of sedation prior to SVT. New fever overnight. Similar Vent settings.  10/27 - Septic. Increased vent requirements. O2- 50%, PEEP 5.  10/28 - goals of care meeting in setting worsening kidney function, inability to wean from vent. 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 Goals of care discussion with son-- decided to transition to CMO with compassionate extubation today. Will make patient as comfortable as possible for transition.   Interim History / Subjective:  Having hematochezia on argatroban, however H&H stable. Renal function continues to worsen. Potassium remains persistently elevated. He continues to make urine, however, UOP slowed over last 12 hours. Afebrile, persistent leukocytosis.   Over the weekend: patient with persistent hematochezia on agatroban which was stopped. Patient required 2 units of blood, and desmopressin.  Today: H&H has stabilized and improved. Kidney function continues to worsen. Son has chosen to transition to CMO with compassionate extubation TODAY.   Objective   Blood pressure (!) 168/87, pulse (!) 109, temperature 97.6 F (36.4 C), temperature source Axillary, resp. rate 18, height 6\' 1"  (1.854 m), weight 79.1 kg, SpO2 94 %.    Vent Mode: PRVC FiO2 (%):  [30 %-35 %] 30 % Set Rate:  [20 bmp] 20 bmp Vt Set:  [500 mL] 500 mL PEEP:  [5 cmH20] 5 cmH20 Plateau Pressure:  [20 cmH20-22 cmH20] 20 cmH20   Intake/Output Summary  (Last 24 hours) at 05/24/2021 1053 Last data filed at 05/24/2021 0900  Gross per 24 hour  Intake 3163.52 ml  Output 3420 ml  Net -256.48 ml   Filed Weights   05/19/21 0500 05/20/21 0346 05/24/21 0416  Weight: 77.3 kg 79.3 kg 79.1 kg   ROS unable to attain due to patient being intubated and sedated   Examination: General appearance: Caucasian male, ill-appearing, intubated and sedated.  HENT: opening eyes spontaneously, not following any commands. Cough intact. Copious tan/pink, thick secretions in-line.  Lungs: bilateral crackles and rhonchi CV: tachycardic, no MRG Abdomen: Soft, distended, +BS - RN had to stop tube feeds d/t retention.  Extremities: 2+ pitting edema of BLE, 3+ BUE GU: foley in place.  Neuro: sedated. Does not follow commands. No response to noxious stimuli.   10/19>> respiratory culture -  pansensitive PsA (on cefepime)  10/25 >> CXR - Persistence of bilateral multifocal airspace densities. 10/27>> CXR - No significant change in bilateral opacities  10/27>> renal US - Negative for hydronephrosis. Cortex appears slightly echogenic suggestive of medical renal disease. Enlarged prostate.   Assessment & Plan:  **GOALS OF CARE** In the setting of declining condition, grim prognosis, multiorgan failure with worsening renal function, electrolyte derangements, active infection, and coagulopathy + active bleeding, patient's son Mellody Dance has decided to transition patient to comfort care with compassionate extubation. Patient will be made as comfortable as possible with this transition.   Best Practice (right click and "Reselect all SmartList Selections" daily)   Transition to CMO today with compassionate extubation.    DVT/GI PRX  assessed I Assessed the need for Labs I Assessed the need for Foley I Assessed the need for Central Venous Line Family Discussion when available I Assessed the need for Mobilization I made an Assessment of medications to be adjusted  accordingly Safety Risk assessment completed  CASE DISCUSSED IN MULTIDISCIPLINARY ROUNDS WITH ICU TEAM     Critical Care Time devoted to patient care services described in this note is 40 minutes.  Critical care was necessary to treat /prevent imminent and life-threatening deterioration. Overall, patient is critically ill, prognosis is guarded.  Patient with Multiorgan failure and at high risk for cardiac arrest and death.    Lucie Leather, M.D.  [facility]  Medical Director [facility] Medical Director [facility] Cardio-Pulmonary Department

## 2021-05-24 NOTE — Progress Notes (Signed)
Patient extubated to comfort care, per MD order.

## 2021-05-25 DEATH — deceased

## 2021-06-24 NOTE — Progress Notes (Signed)
Patient's son Anthon Harpole notified of patient's passing. Will let staff know what funeral home to release to later today.  Release already signed.

## 2021-06-24 NOTE — Progress Notes (Signed)
Patient pronounced at bedside by Camie Patience, NP

## 2021-06-24 NOTE — Death Summary Note (Addendum)
DEATH SUMMARY   Patient Details  Name: Jonathan Salinas MRN: [id number] DOB: [birth year]  Admission/Discharge Information   Admit Date:  2021-05-16  Date of Death:  06-07-21   Time of Death:  53PM  Length of Stay: 22  Referring Physician: Juline Patch, MD   Reason(s) for Hospitalization  COVID 19 infection NSTEMI PE  Diagnoses  Preliminary cause of death: COVID 19 infection Secondary Diagnoses (including complications and co-morbidities):  Active Problems:   Pneumonia due to COVID-19 virus   Pulmonary embolism (HCC)   DVT (deep venous thrombosis) (HCC)   COVID-19   Protein-calorie malnutrition, severe   Endotracheal tube present   Acute respiratory failure with hypoxia (HCC)   Heparin induced thrombocytopenia   AKI (acute kidney injury) ([place])   Hyperkalemia   85 y.o. Male admitted with Acute Hypoxic Respiratory Failure in the setting of COVID-19 Pneumonia and Pulmonary Embolism.  Underwent mechanical pulmonary thrombectomy on 05/06/2021.   Developed acute respiratory distress and hypoxia on 05/11/21 requiring emergent intubation and mechanical ventilation.  Pseudomonal PNA confirmed on tracheal aspirate, zosyn discontinued d/t possible cause of worsening renal function, started on cipro.    Pertinent  Medical History  GERD  HTN CKD   Micro Data:  04/26/21: SARS-CoV-2 PCR>> Positive 05/16/2021: Blood culture x2>> no growth 05/16/21: MRSA PCR>> negative 05/11/2021: Tracheal aspirate>>+Pseudomonas 05/11/2021: Fungal negative 10/26: Trach aspirate >> +Pseudomonas   Antimicrobials:  Vancomycin 17-May-2023>>10/11 Cefepime 10/10>>10/12, 10/21>>10/26 (7 day course) Unasyn 10/18>>10/21 Zosyn 10/27 >> 10/29  Cirpofloxacin 10/29    Significant Hospital Events: Including procedures, antibiotic start and stop dates in addition to other pertinent events   2021-05-16: Pt admitted to ICU with acute hypoxic respiratory failure secondary to COVID-19 and multifocal pneumonia   05/05/21: Hospitalist took over care; Baricitinib started; CTA Chest with PE (right main pulmonary artery and bilateral subsegmental pulmonary arteries); + Right heart strain 05/06/21: Evaluated by Vascular Surgery, plan for Thrombectomy tomorrow 05/13/2021: Underwent Mechanical Thrombectomy of PE by Vascular Surgery; Venous US + for DVT Left posterior tibial veins 05/08/21: Transitioned to Eliquis 05/09/21: Transferred from [place] to [place] (room 234) 05/10/21: Pulmonary consulted due to worsening oxygenation 05/11/21: Developed Acute Respiratory Distress and Hypoxia requiring transfer to ICU and EMERGENT INTUBATION, concern for possible aspiration 10/19 severe hypoxia 10/20 severe hypoxia, unable to wean from vent 10/21 Similar vent requirements 10/22 modest improvement in Vent requirements. Down to 40%, PEEP10 10/23- patient failed SBT with severe aggitation and abnormal vital signs.  10/24 - failed SBT, tachycardia, hypertension. Slight improvement in ventilation requirements. O2 35%, PEEP 5.  10/25 - weaned sedation, did not tolerate - tachypnea, tachycardia, hypertension. Vent setting remain same - O2 35%, PEEP 5.  10/26 - failed weaning of sedation prior to SVT. New fever overnight. Similar Vent settings.  10/27 - Septic. Increased vent requirements. O2- 50%, PEEP 5.  10/28 - goals of care meeting in setting worsening kidney function, inability to wean from vent. 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 Goals of care discussion with son-- decided to transition to [place] with compassionate extubation today. Will make patient as comfortable as possible for transition.   GOALS OF CARE DISCUSSION  The Clinical status was relayed to family in detail.  Updated and notified of patients medical condition.    Patient remains unresponsive and will not open eyes to command.   Patient is having a weak cough and struggling to remove  secretions.   Patient with increased WOB and using accessory  muscles to breathe Explained to family course of therapy and the modalities    Patient with Progressive multiorgan failure with a very high probablity of a very minimal chance of meaningful recovery despite all aggressive and optimal medical therapy.   Family understands the situation.  They have consented and agreed to DNR/DNI and would like to proceed with Comfort care measures.  Family are satisfied with Plan of action and management. All questions answered     Pertinent Labs and Studies  Significant Diagnostic Studies DG Chest 2 View  Result Date: 04/26/2021 CLINICAL DATA:  Chest congestion, wheezing, and low-grade fever for the past 5 days. EXAM: CHEST - 2 VIEW COMPARISON:  Chest x-ray dated November 11, 2019. FINDINGS: The heart size and mediastinal contours are within normal limits. Mild left lower lobe atelectasis. No focal consolidation, pleural effusion, or pneumothorax. No acute osseous abnormality. IMPRESSION: 1. Mild left lower lobe atelectasis. Electronically Signed   By: Obie DredgeWilliam T Derry M.D.   On: 04/26/2021 14:56   DG Abd 1 View  Result Date: 05/11/2021 CLINICAL DATA:  OG tube placement EXAM: ABDOMEN - 1 VIEW COMPARISON:  None. FINDINGS: Esophagogastric tube with tip and side port below the diaphragm, tip in the duodenal bulb. Nonobstructive pattern of bowel gas. IMPRESSION: Esophagogastric tube with tip and side port below the diaphragm, tip in the duodenal bulb. Electronically Signed   By: Jearld LeschAlex D Bibbey M.D.   On: 05/11/2021 13:27   CT Angio Chest Pulmonary Embolism (PE) W or WO Contrast  Result Date: 05/05/2021 CLINICAL DATA:  Evaluate for pulmonary embolus EXAM: CT ANGIOGRAPHY CHEST WITH CONTRAST TECHNIQUE: Multidetector CT imaging of the chest was performed using the standard protocol during bolus administration of intravenous contrast. Multiplanar CT image reconstructions and MIPs were obtained to evaluate  the vascular anatomy. CONTRAST:  75mL OMNIPAQUE IOHEXOL 350 MG/ML SOLN COMPARISON:  None. FINDINGS: Cardiovascular: Adequate contrast opacification of the pulmonary arteries. Pulmonary embolus seen in the distal right main pulmonary artery, segmental right upper and lower lobe pulmonary arteries, and bilateral subsegmental pulmonary arteries. RV/LV ratio is greater than 1. Normal heart size. No pericardial effusion. No significant coronary artery calcifications. Atherosclerotic disease of the thoracic aorta. Mediastinum/Nodes: Small hiatal hernia. Thyroid is unremarkable. Prominent subcentimeter mediastinal lymph nodes which are likely reactive. Reference subcarinal lymph node measuring 8 mm in short axis on series 5, image 134. Lungs/Pleura: Central airways are patent. Bilateral patchy peripheral predominant ground-glass opacities. No pleural effusion or pneumothorax, Upper Abdomen: Low-density liver lesions, largest measuring up to 3.3 cm, which are likely simple cysts. No acute abnormality. Musculoskeletal: No chest wall abnormality. No acute or significant osseous findings. Review of the MIP images confirms the above findings. IMPRESSION: Pulmonary embolus seen in the distal right main pulmonary artery, segmental right upper and lower lobe pulmonary arteries, and bilateral subsegmental pulmonary arteries. RV/LV ratio is greater than 1, which is suggestive of heart right heart strain. Correlate with echocardiography. Patchy, bilateral peripheral predominant ground-glass opacities, compatible with history of COVID-19 infection. Aortic Atherosclerosis (ICD10-I70.0). Critical Value/emergent results were called by telephone at the time of interpretation on 05/05/2021 at 2:38 pm to RN Jayme CloudAlicia Sawyer, who verbally acknowledged these results and will relay the results to the hospitalist on call. Electronically Signed   By: Allegra LaiLeah  Strickland M.D.   On: 05/05/2021 14:39   US RENAL  Result Date: 05/20/2021 CLINICAL  DATA:  Acute renal failure EXAM: RENAL / URINARY TRACT ULTRASOUND COMPLETE COMPARISON:  None. FINDINGS: Right Kidney: Renal measurements: 11.5 x 5.3 x  5.6 cm = volume: 177 mL. Cortex appears slightly echogenic. No mass or hydronephrosis. Left Kidney: Renal measurements: 11.6 x 5.9 x 3.7 cm = volume: 131 mL. Cortex appears slightly echogenic. No hydronephrosis. Tiny cyst at the midpole measuring 8 mm. Bladder: Foley catheter in the bladder. Other: Prostatic enlargement. IMPRESSION: 1. Negative for hydronephrosis. Cortex appears slightly echogenic suggestive of medical renal disease 2. Enlarged prostate Electronically Signed   By: Donavan Foil M.D.   On: 05/20/2021 18:16   PERIPHERAL VASCULAR CATHETERIZATION  Result Date: 05/06/2021 See surgical note for result.  US Venous Img Lower Bilateral (DVT)  Result Date: 05/17/2021 CLINICAL DATA:  Bilateral lower extremity pain. Pulmonary embolism. History of COVID. Evaluate for lower extremity DVT. EXAM: BILATERAL LOWER EXTREMITY VENOUS DOPPLER ULTRASOUND TECHNIQUE: Gray-scale sonography with graded compression, as well as color Doppler and duplex ultrasound were performed to evaluate the lower extremity deep venous systems from the level of the common femoral vein and including the common femoral, femoral, profunda femoral, popliteal and calf veins including the posterior tibial, peroneal and gastrocnemius veins when visible. The superficial great saphenous vein was also interrogated. Spectral Doppler was utilized to evaluate flow at rest and with distal augmentation maneuvers in the common femoral, femoral and popliteal veins. COMPARISON:  None. FINDINGS: RIGHT LOWER EXTREMITY Common Femoral Vein: No evidence of thrombus. Normal compressibility, respiratory phasicity and response to augmentation. Saphenofemoral Junction: No evidence of thrombus. Normal compressibility and flow on color Doppler imaging. Profunda Femoral Vein: No evidence of thrombus. Normal  compressibility and flow on color Doppler imaging. Femoral Vein: No evidence of thrombus. Normal compressibility, respiratory phasicity and response to augmentation. Popliteal Vein: No evidence of thrombus. Normal compressibility, respiratory phasicity and response to augmentation. Calf Veins: No evidence of thrombus. Normal compressibility and flow on color Doppler imaging. Superficial Great Saphenous Vein: No evidence of thrombus. Normal compressibility. Other Findings: Note is made of an approximately 3.0 x 2.8 x 0.6 cm minimally complex fluid collection with the right popliteal fossa compatible with a Baker's cyst. LEFT LOWER EXTREMITY Common Femoral Vein: No evidence of thrombus. Normal compressibility, respiratory phasicity and response to augmentation. Saphenofemoral Junction: No evidence of thrombus. Normal compressibility and flow on color Doppler imaging. Profunda Femoral Vein: No evidence of thrombus. Normal compressibility and flow on color Doppler imaging. Femoral Vein: No evidence of thrombus. Normal compressibility, respiratory phasicity and response to augmentation. Popliteal Vein: No evidence of thrombus. Normal compressibility, respiratory phasicity and response to augmentation. Calf Veins: The examination is positive for occlusive DVT involving both paired posterior tibial veins (images 60 and 61). The peroneal veins appear patent where imaged. Superficial Great Saphenous Vein: No evidence of thrombus. Normal compressibility. Other Findings:  None. IMPRESSION: 1. Examination is positive for occlusive DVT involving both paired left posterior tibial veins. There is no extension of this distal tibial DVT to the more proximal venous system of the left lower extremity. 2. No evidence of DVT within the right lower extremity. Electronically Signed   By: Sandi Mariscal M.D.   On: 05/14/2021 07:39   DG Chest Port 1 View  Result Date: 05/20/2021 CLINICAL DATA:  Acute respiratory failure and hypoxia. EXAM:  PORTABLE CHEST 1 VIEW COMPARISON:  Chest radiograph dated 05/18/2021. FINDINGS: Interval removal of the right IJ central venous line. Endotracheal tube with tip approximately 3.3 cm above the carina. Enteric tube extends below the diaphragm with tip beyond the inferior margin of the image. Bilateral streaky and patchy pulmonary opacities relatively similar to prior radiograph. No large  pleural effusion or pneumothorax. Stable cardiac silhouette. No acute osseous pathology. IMPRESSION: 1. Interval removal of the right IJ central venous line. 2. No significant interval change in the bilateral pulmonary opacities. Electronically Signed   By: Anner Crete M.D.   On: 05/20/2021 02:23   DG Chest Port 1 View  Result Date: 05/18/2021 CLINICAL DATA:  Leukocytosis. EXAM: PORTABLE CHEST 1 VIEW COMPARISON:  05/17/2021 FINDINGS: There is a right IJ catheter with tip at the cavoatrial junction. ETT tip is stable above the carina. NG tube and side port are in the body and antrum of stomach. Stable cardiomediastinal contours. Bilateral multifocal airspace densities are unchanged. IMPRESSION: 1. Stable support apparatus. 2. Persistence of bilateral multifocal airspace densities. Electronically Signed   By: Kerby Moors M.D.   On: 05/18/2021 09:05   DG Chest Port 1 View  Result Date: 05/17/2021 CLINICAL DATA:  Endotracheal tube present. EXAM: PORTABLE CHEST 1 VIEW COMPARISON:  One-view chest x-ray 05/11/2021 FINDINGS: The endotracheal tube is stable position. A right IJ line terminates at the cavoatrial junction. Lung volumes are low. Mild pulmonary vascular congestion remains. Patchy airspace opacities are present greater than right. IMPRESSION: 1. Stable endotracheal tube and right IJ line. 2. Stable mild pulmonary vascular congestion and patchy airspace opacities. Electronically Signed   By: San Morelle M.D.   On: 05/17/2021 07:49   DG Chest Port 1 View  Result Date: 05/11/2021 CLINICAL DATA:   Encounter for central line placement. EXAM: PORTABLE CHEST 1 VIEW COMPARISON:  One-view chest x-ray 05/11/2021 FINDINGS: Heart is mildly enlarged. Endotracheal tube is stable at 5.3 cm above the carina NG tube terminates in the stomach. A new right IJ line is in place. The tip is at the cavoatrial junction. Interstitial and airspace opacities are similar the prior exam. IMPRESSION: 1. Interval placement of right IJ line without radiographic evidence for complication. 2. Stable interstitial and airspace opacities, likely edema. Infection is considered less likely. Electronically Signed   By: San Morelle M.D.   On: 05/11/2021 16:33   DG Chest Port 1 View  Result Date: 05/11/2021 CLINICAL DATA:  Endotracheal tube and orogastric tube placement. EXAM: PORTABLE CHEST 1 VIEW COMPARISON:  Radiographs 05/10/2021 and 05/16/2021.  CT 05/05/2021. FINDINGS: 1254 hours. Interval intubation. Tip of the endotracheal tube is in the mid trachea, 4.6 cm above the carina. Enteric tube projects below the diaphragm, tip not visualized. The heart size and mediastinal contours are stable. Patchy airspace opacities are present in both lungs, with interval improved aeration of the left lung base. No pneumothorax or significant pleural effusion identified. The bones appear unchanged. IMPRESSION: 1. Satisfactory position of the endotracheal and nasogastric tubes. 2. Multifocal airspace opacities consistent with COVID-19 pneumonia. Overall aeration of the left lung base has improved in the interval. Electronically Signed   By: Richardean Sale M.D.   On: 05/11/2021 13:27   DG Chest Port 1 View  Result Date: 05/10/2021 CLINICAL DATA:  Dyspnea. EXAM: PORTABLE CHEST 1 VIEW COMPARISON:  CTA chest dated May 05, 2021. Chest x-ray dated May 03, 2021. FINDINGS: Stable cardiomediastinal silhouette. Reduced lung volumes compared to the prior study. Left greater than right peripheral basilar predominant interstitial airspace  opacities appear similar. No pleural effusion or pneumothorax. No acute osseous abnormality. IMPRESSION: 1. Relatively unchanged multifocal pneumonia. Electronically Signed   By: Titus Dubin M.D.   On: 05/10/2021 16:59   DG Chest Port 1 View  Result Date: 05/24/2021 CLINICAL DATA:  Shortness of breath, weakness, COVID EXAM: PORTABLE CHEST  1 VIEW COMPARISON:  04/26/2021 FINDINGS: Patchy bilateral airspace disease, new since prior study compatible with multifocal pneumonia. Heart is normal size. No visible effusions or pneumothorax. Aortic atherosclerosis. No acute bony abnormality. IMPRESSION: New extensive patchy bilateral airspace disease compatible with multifocal pneumonia. Electronically Signed   By: Rolm Baptise M.D.   On: 05/24/2021 17:45   ECHOCARDIOGRAM COMPLETE  Result Date: 05/04/2021    ECHOCARDIOGRAM REPORT   Patient Name:   BROXTON SIEVERS Date of Exam: 05/04/2021 Medical Rec #:  [id number]        Height:       73.0 in Accession #:    [id number]       Weight:       162.3 lb Date of Birth:  06/11/1936        BSA:          1.969 m Patient Age:    85 years         BP:           128/88 mmHg Patient Gender: M                HR:           32 bpm. Exam Location:  [facility] Procedure: 2D Echo, Color Doppler and Cardiac Doppler Indications:     Acute respiratory distress R06.03  History:         Patient has no prior history of Echocardiogram examinations.                  Risk Factors:Hypertension.  Sonographer:     Sherrie Sport Referring Phys:  [id number] [place] E GRAVES Diagnosing Phys: Harrell Gave End MD  Sonographer Comments: Suboptimal parasternal window and no subcostal window. IMPRESSIONS  1. Left ventricular ejection fraction, by estimation, is 55 to 60%. The left ventricle has normal function. Left ventricular endocardial border not optimally defined to evaluate regional wall motion. There is mild to moderate left ventricular hypertrophy. Left ventricular diastolic parameters are consistent with Grade  I diastolic dysfunction (impaired relaxation).  2. Right ventricular systolic function is low normal. The right ventricular size is mildly enlarged.  3. The mitral valve is abnormal. Mild to moderate mitral valve regurgitation. No evidence of mitral stenosis.  4. The aortic valve was not well visualized. Aortic valve regurgitation is not visualized. No aortic stenosis is present. FINDINGS  Left Ventricle: Left ventricular ejection fraction, by estimation, is 55 to 60%. The left ventricle has normal function. Left ventricular endocardial border not optimally defined to evaluate regional wall motion. The left ventricular internal cavity size was normal in size. There is mild to moderate left ventricular hypertrophy. Left ventricular diastolic parameters are consistent with Grade I diastolic dysfunction (impaired relaxation). Right Ventricle: The right ventricular size is mildly enlarged. Right vetricular wall thickness was not well visualized. Right ventricular systolic function is low normal. Left Atrium: Left atrial size was normal in size. Right Atrium: Right atrial size was normal in size. Pericardium: The pericardium was not well visualized. Mitral Valve: The mitral valve is abnormal. Mild to moderate mitral valve regurgitation. No evidence of mitral valve stenosis. Tricuspid Valve: The tricuspid valve is not well visualized. Tricuspid valve regurgitation is not demonstrated. Aortic Valve: The aortic valve was not well visualized. Aortic valve regurgitation is not visualized. No aortic stenosis is present. Aortic valve mean gradient measures 2.0 mmHg. Aortic valve peak gradient measures 3.4 mmHg. Aortic valve area, by VTI measures 3.19 cm. Pulmonic Valve: The pulmonic valve was not well  visualized. Aorta: The aortic root is normal in size and structure. Pulmonary Artery: The pulmonary artery is not well seen. Venous: The inferior vena cava was not well visualized. IAS/Shunts: The interatrial septum was not well  visualized.  LEFT VENTRICLE PLAX 2D LVIDd:         3.80 cm   Diastology LVIDs:         2.70 cm   LV e' medial:    5.76 cm/s LV PW:         1.30 cm   LV E/e' medial:  9.3 LV IVS:        1.20 cm   LV e' lateral:   7.40 cm/s LVOT diam:     2.00 cm   LV E/e' lateral: 7.2 LV SV:         58 LV SV Index:   30 LVOT Area:     3.14 cm  RIGHT VENTRICLE RV Basal diam:  3.50 cm RV S prime:     11.88 cm/s TAPSE (M-mode): 1.6 cm LEFT ATRIUM           Index        RIGHT ATRIUM           Index LA diam:      4.40 cm 2.24 cm/m   RA Area:     13.80 cm LA Vol (A2C): 23.4 ml 11.89 ml/m  RA Volume:   29.90 ml  15.19 ml/m LA Vol (A4C): 35.7 ml 18.13 ml/m  AORTIC VALVE AV Area (Vmax):    3.13 cm AV Area (Vmean):   2.87 cm AV Area (VTI):     3.19 cm AV Vmax:           91.80 cm/s AV Vmean:          69.500 cm/s AV VTI:            0.183 m AV Peak Grad:      3.4 mmHg AV Mean Grad:      2.0 mmHg LVOT Vmax:         91.40 cm/s LVOT Vmean:        63.400 cm/s LVOT VTI:          0.186 m LVOT/AV VTI ratio: 1.02  AORTA Ao Root diam: 2.90 cm MITRAL VALVE               TRICUSPID VALVE MV Area (PHT): 6.02 cm    TR Peak grad:   16.2 mmHg MV Decel Time: 126 msec    TR Vmax:        201.00 cm/s MV E velocity: 53.60 cm/s MV A velocity: 94.70 cm/s  SHUNTS MV E/A ratio:  0.57        Systemic VTI:  0.19 m                            Systemic Diam: 2.00 cm Yvonne Kendall MD Electronically signed by Yvonne Kendall MD Signature Date/Time: 05/04/2021/6:49:13 PM    Final     Microbiology Recent Results (from the past 240 hour(s))  MRSA Next Gen by PCR, Nasal     Status: None   Collection Time: 05/19/21  3:18 PM   Specimen: Nasal Mucosa; Nasal Swab  Result Value Ref Range Status   MRSA by PCR Next Gen NOT DETECTED NOT DETECTED Final    Comment: (NOTE) The GeneXpert MRSA Assay (FDA approved for NASAL specimens only), is one component of a comprehensive  MRSA colonization surveillance program. It is not intended to diagnose MRSA infection nor to  guide or monitor treatment for MRSA infections. Test performance is not FDA approved in patients less than 4 years old. Performed at [facility], [address] [place], [place] [address]   Culture, Respiratory w Gram Stain     Status: None   Collection Time: 05/19/21  3:31 PM   Specimen: Tracheal Aspirate; Respiratory  Result Value Ref Range Status   Specimen Description   Final    TRACHEAL ASPIRATE Performed at [facility], [address] [place], [place] [address]    Special Requests   Final    NONE Performed at [facility], [address] [place], [place] [address]    Gram Stain   Final    ABUNDANT WBC PRESENT, PREDOMINANTLY PMN NO ORGANISMS SEEN Performed at [facility], [place] [address] [place], [place] [address]    Culture RARE PSEUDOMONAS AERUGINOSA  Final   Report Status 05/22/2021 FINAL  Final   Organism ID, Bacteria PSEUDOMONAS AERUGINOSA  Final      Susceptibility   Pseudomonas aeruginosa - MIC*    CEFTAZIDIME 16 INTERMEDIATE Intermediate     CIPROFLOXACIN <=0.25 SENSITIVE Sensitive     GENTAMICIN <=1 SENSITIVE Sensitive     IMIPENEM 1 SENSITIVE Sensitive     * RARE PSEUDOMONAS AERUGINOSA    Lab Basic Metabolic Panel: Recent Labs  Lab 05/20/21 0520 05/20/21 1119 05/21/21 0412 05/21/21 1251 05/22/21 0431 05/22/21 1130 05/23/21 0254 05/23/21 0815 05/23/21 1636 05/23/21 2103 05/24/21 0313  NA 143   < > 141   < > 140   < > 145 145 145 144 144  K 5.9*   < > 5.7*   < > 6.5*   < > 6.3* 5.8* 5.9* 5.6* 5.3*  CL 110   < > 110   < > 107   < > 109 108 108 108 108  CO2 24   < > 24   < > 20*   < > 23 24 25 23 23   GLUCOSE 158*   < > 161*   < > 159*   < > 137* 116* 170* 172* 165*  BUN 96*   < > 124*   < > 153*   < > 156* 162* 158* 179* 166*  CREATININE 2.63*   < > 3.77*   < > 4.63*   < > 5.04* 5.34* 5.23* 5.27* 5.47*  CALCIUM 7.9*   < > 8.0*   < > 8.2*   < > 8.1* 8.2* 8.1* 8.0* 8.2*  MG 2.4  --  2.5*  --  2.8*  --  2.7*   --   --   --  2.9*  PHOS 4.2  --  7.3*  --  6.0*  --  5.8*  --   --   --  7.7*   < > = values in this interval not displayed.   Liver Function Tests: No results for input(s): AST, ALT, ALKPHOS, BILITOT, PROT, ALBUMIN in the last 168 hours. No results for input(s): LIPASE, AMYLASE in the last 168 hours. No results for input(s): AMMONIA in the last 168 hours. CBC: Recent Labs  Lab 05/20/21 0520 05/21/21 0412 05/22/21 0431 05/22/21 1130 05/23/21 0254 05/23/21 0815 05/23/21 1008 05/23/21 1636 05/24/21 0017  WBC 11.4* 12.7* 16.0*  --  18.8* 17.5*  --   --   --   HGB 8.7* 8.6* 8.0*   < >  7.7* 7.5* 6.9* 7.9* 9.7*  HCT 27.7* 27.1* 25.4*   < > 23.5* 22.7* 20.7* 24.2* 29.1*  MCV 99.3 100.4* 98.4  --  95.9 96.2  --   --   --   PLT 49* 50* 70*  --  99* 100*  --   --   --    < > = values in this interval not displayed.   Cardiac Enzymes: No results for input(s): CKTOTAL, CKMB, CKMBINDEX, TROPONINI in the last 168 hours. Sepsis Labs: Recent Labs  Lab 05/19/21 0509 05/20/21 0520 05/21/21 0412 05/22/21 0431 05/23/21 0254 05/23/21 0815  PROCALCITON 1.71 4.86  --   --   --   --   WBC 11.7* 11.4* 12.7* 16.0* 18.8* 17.5*       Makya Phillis 06/19/2021, 7:26 AM

## 2021-06-24 NOTE — Progress Notes (Signed)
I was NOT notified via phone or page, I just reviewed Epic chat message Will ask APP to pronounce and we will complete Death Summary

## 2021-06-24 NOTE — Progress Notes (Signed)
Arrived at bedside pronounced time of death at 07:28 am~no audible or palpable pulses present and no audible or spontaneous respirations present.  Notified RN of time of death.  Camie Patience, AGNP  Pulmonary/Critical Care Pager [phone] (please enter 7 digits) PCCM Consult Pager [phone] (please enter 7 digits)

## 2021-06-24 NOTE — Progress Notes (Signed)
Pt passed away at 0640 am.  MD on call and charge nurse notified.  Will contact family with day shift.

## 2021-06-24 DEATH — deceased

## 2021-06-28 LAB — BLOOD GAS, ARTERIAL
Acid-base deficit: 0.5 mmol/L (ref 0.0–2.0)
Bicarbonate: 26.7 mmol/L (ref 20.0–28.0)
FIO2: 100
MECHVT: 500 mL
O2 Saturation: 90.3 %
PEEP: 12 cmH2O
Patient temperature: 37
RATE: 20 resp/min
pCO2 arterial: 53 mmHg — ABNORMAL HIGH (ref 32.0–48.0)
pH, Arterial: 7.31 — ABNORMAL LOW (ref 7.350–7.450)
pO2, Arterial: 65 mmHg — ABNORMAL LOW (ref 83.0–108.0)

## 2023-07-24 IMAGING — DX DG CHEST 1V PORT
2 series · 2 of 2 positions shown · non-contrast
Comparison: Chest radiograph dated 05/18/2021.

CLINICAL DATA: Acute respiratory failure and hypoxia.

EXAM:
PORTABLE CHEST 1 VIEW

[chest ap (1 of 2)]
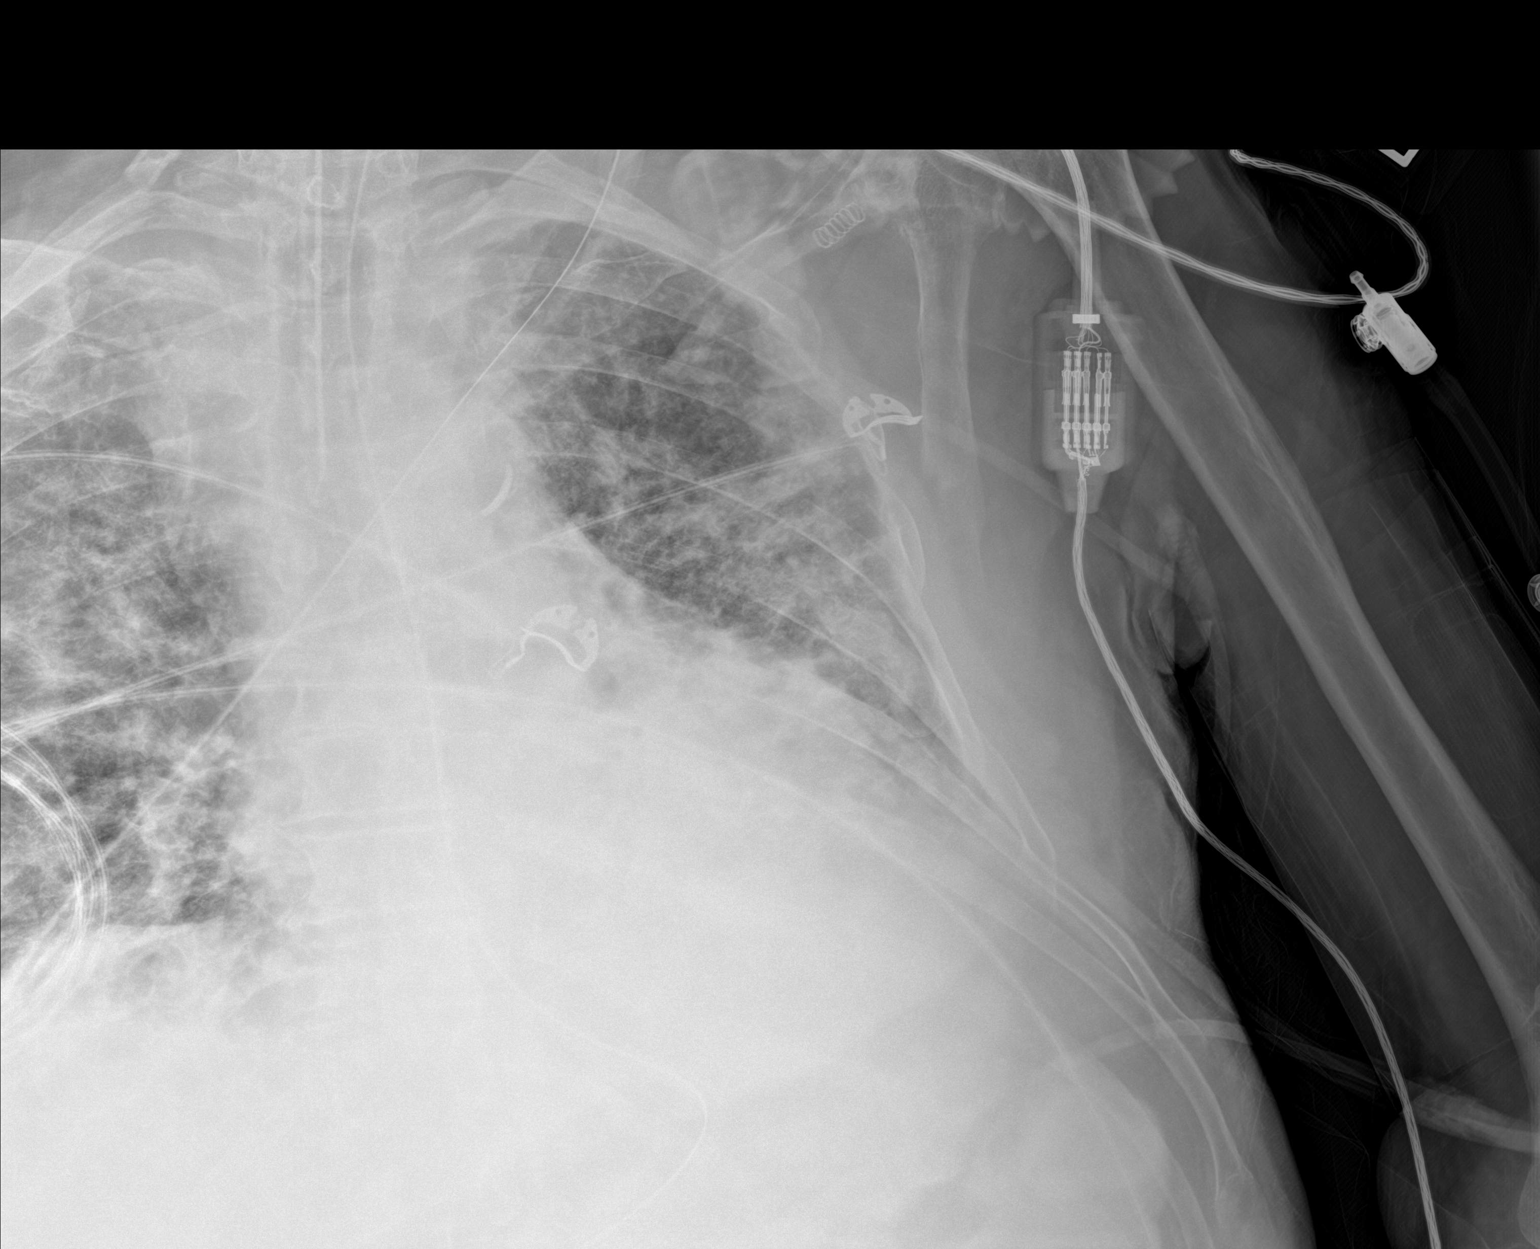

[chest ap (2 of 2)]
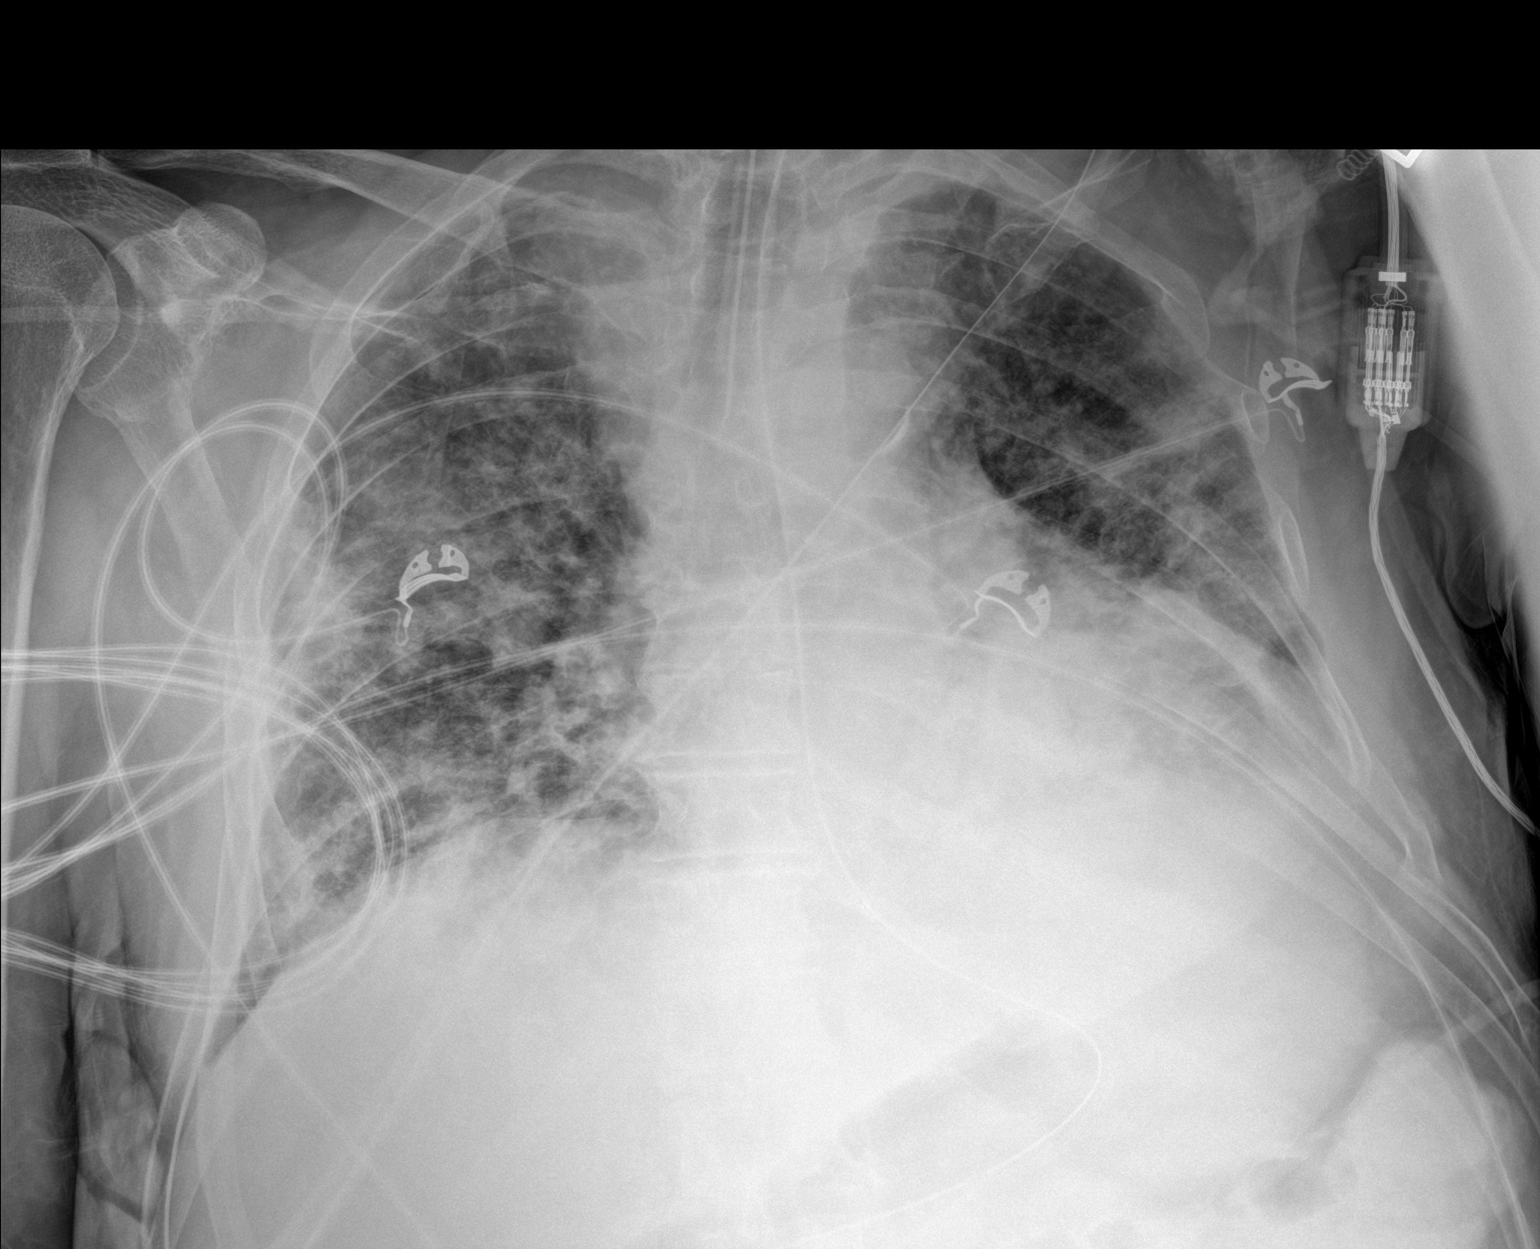

[2 of 2 positions shown; findings below may reference images not displayed]

FINDINGS: Interval removal of the right IJ central venous line. Endotracheal
tube with tip approximately 3.3 cm above the carina. Enteric tube
extends below the diaphragm with tip beyond the inferior margin of
the image. Bilateral streaky and patchy pulmonary opacities
relatively similar to prior radiograph. No large pleural effusion or
pneumothorax. Stable cardiac silhouette. No acute osseous pathology.
IMPRESSION: 1. Interval removal of the right IJ central venous line.
2. No significant interval change in the bilateral pulmonary
opacities.

## 2023-07-24 IMAGING — US US RENAL
1 series · 14 of 25 positions shown · non-contrast
Comparison: None.

CLINICAL DATA: Acute renal failure

EXAM:
RENAL / URINARY TRACT ULTRASOUND COMPLETE

[Series 1: us renal · 0.24mm/px · 14 of 26 slices shown]
[im 1/26]
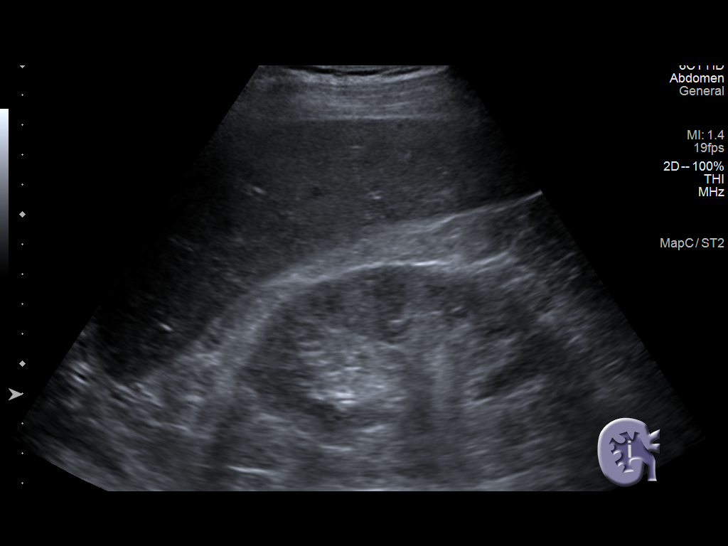
[im 3/26]
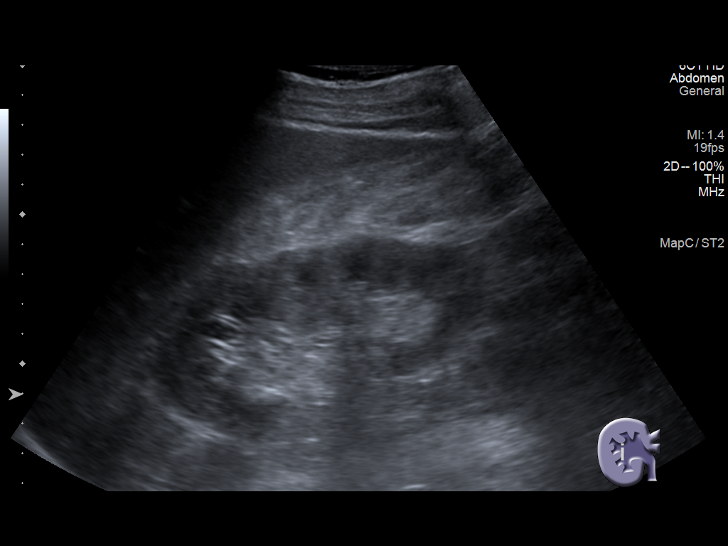
[im 5/26]
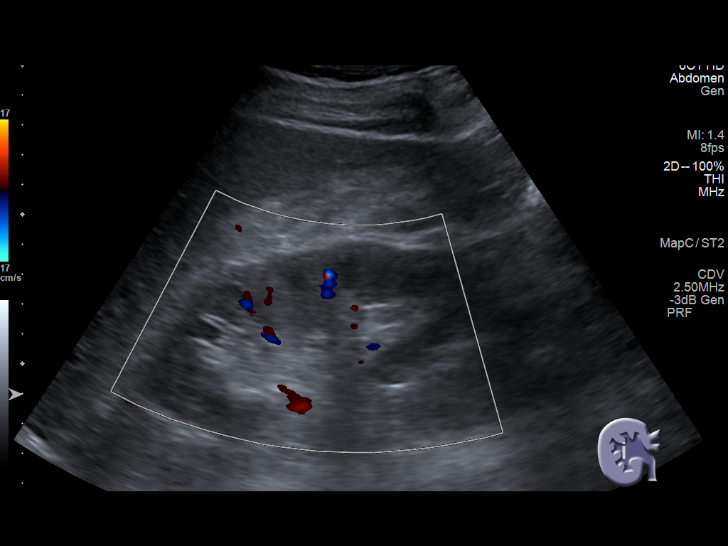
[im 7/26]
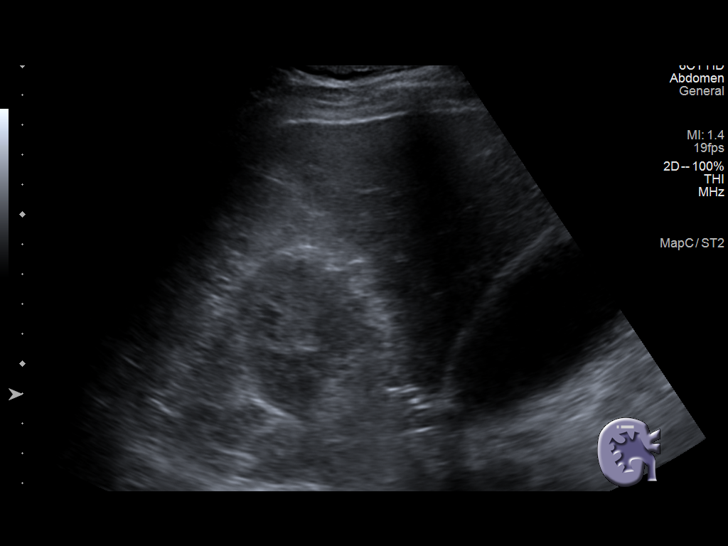
[im 9/26]
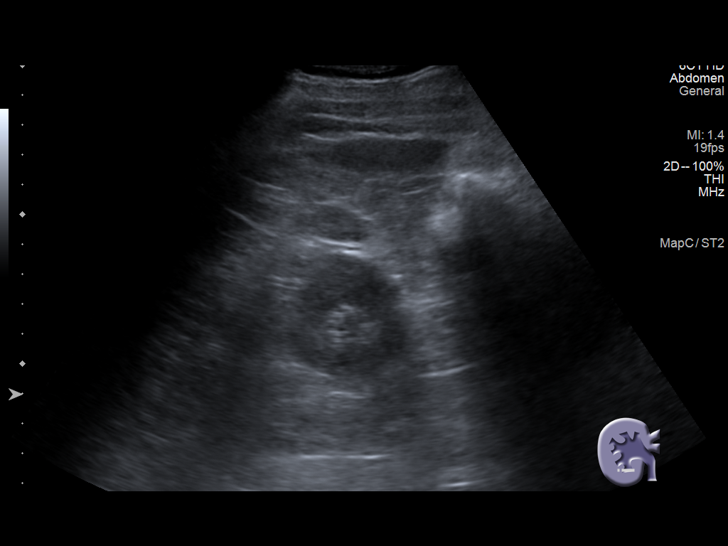
[im 10/26]
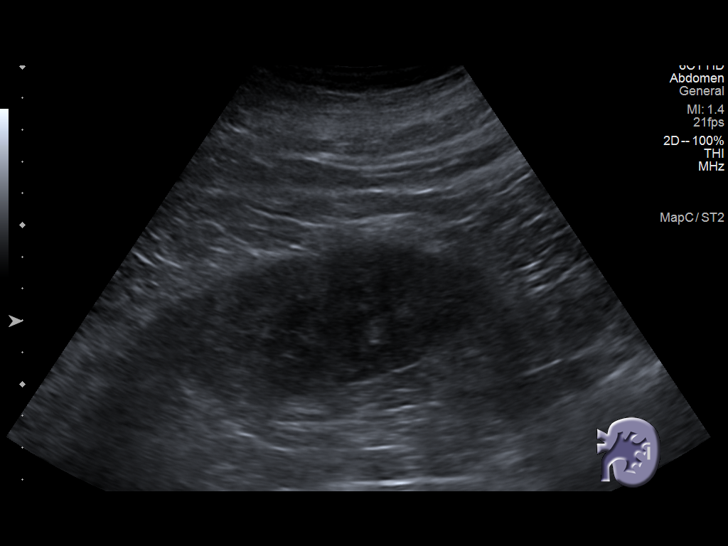
[im 12/26]
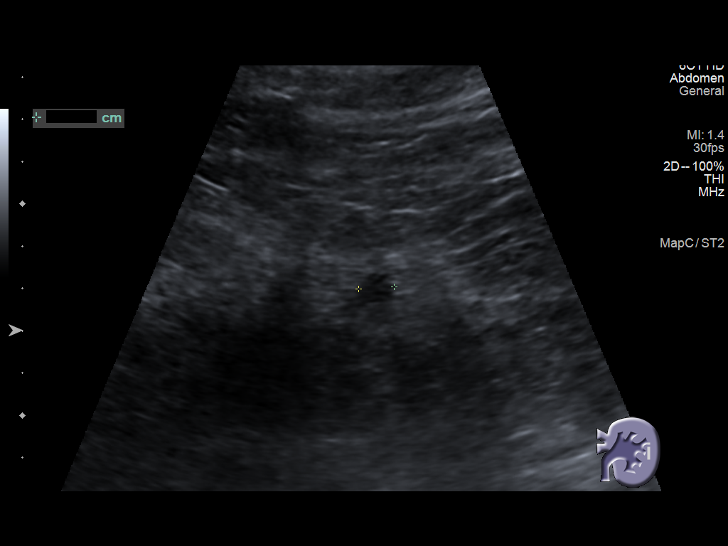
[im 14/26]
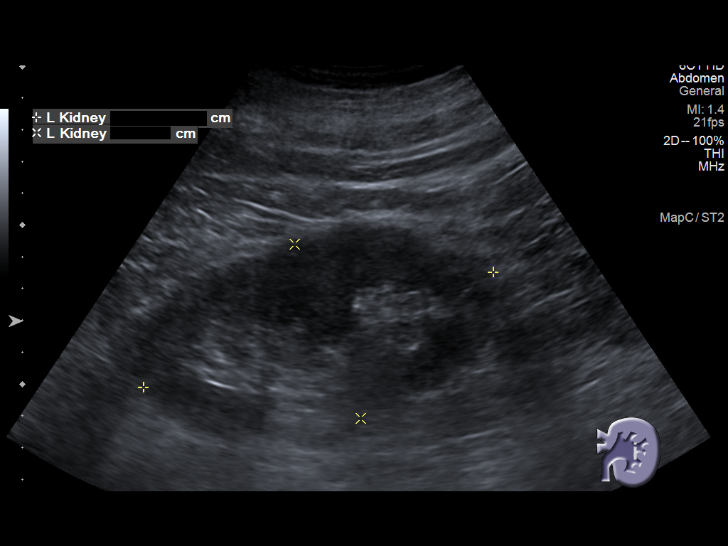
[im 16/26]
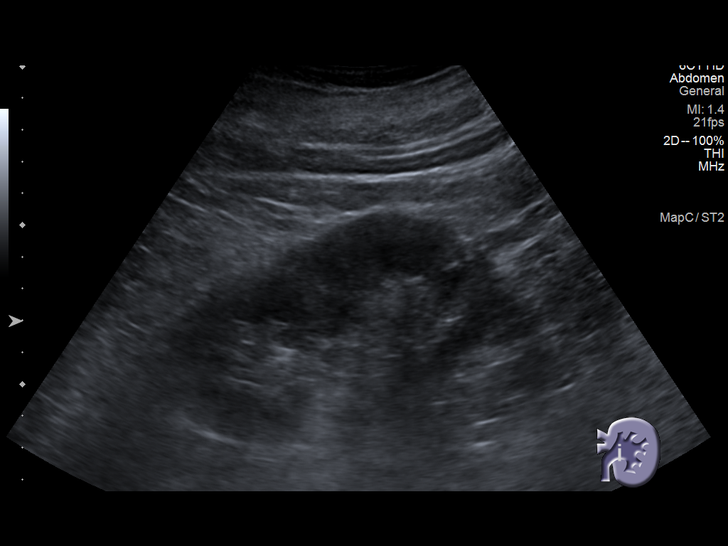
[im 17/26]
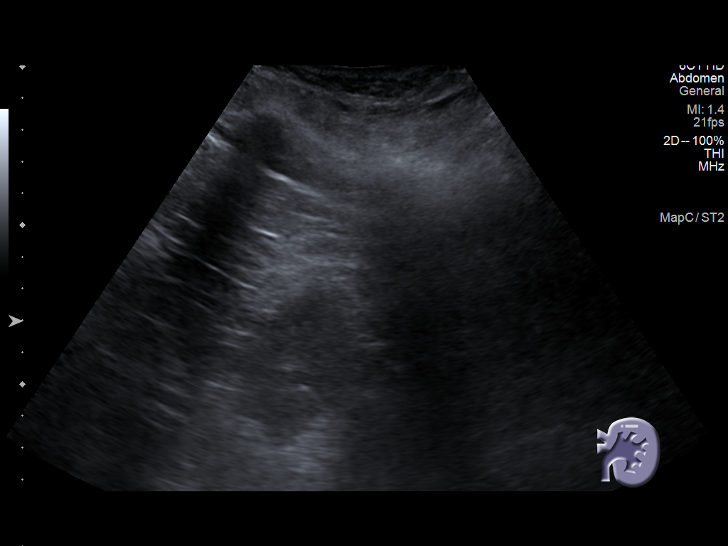
[im 19/26]
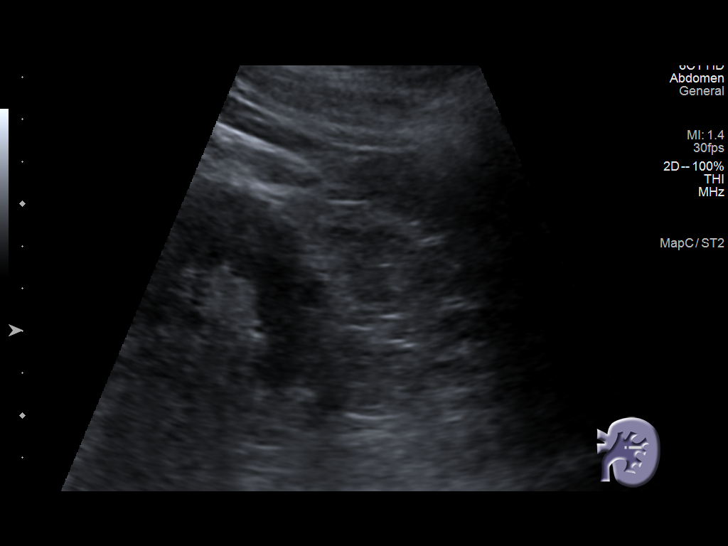
[im 21/26]
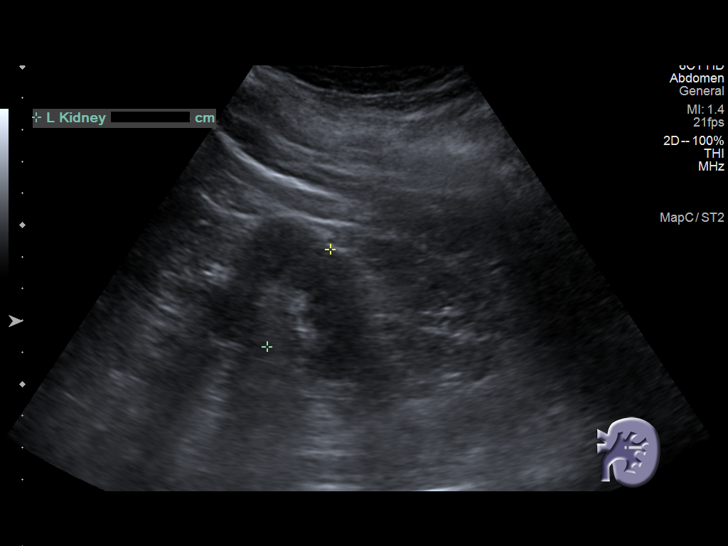
[im 23/26]
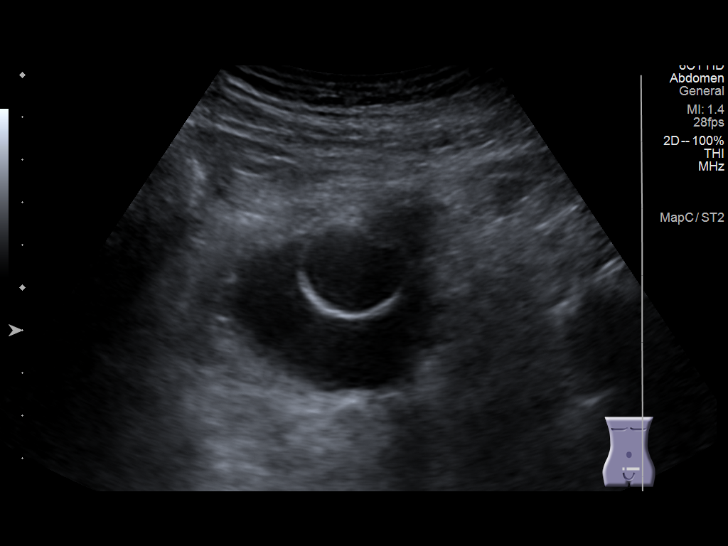
[im 26/26]
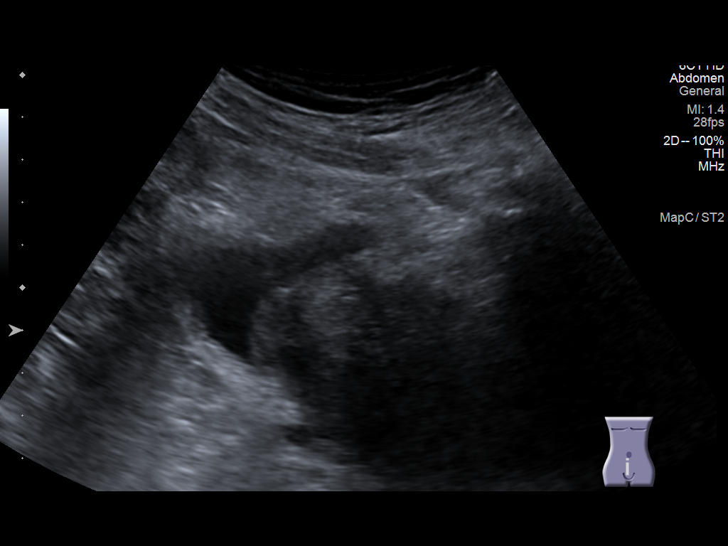

[14 of 25 positions shown; findings below may reference images not displayed]

FINDINGS: Right Kidney:

Renal measurements: 11.5 x 5.3 x 5.6 cm = volume: 177 mL. Cortex
appears slightly echogenic. No mass or hydronephrosis.

Left Kidney:

Renal measurements: 11.6 x 5.9 x 3.7 cm = volume: 131 mL. Cortex
appears slightly echogenic. No hydronephrosis. Tiny cyst at the
midpole measuring 8 mm.

Bladder:

Foley catheter in the bladder.

Other:

Prostatic enlargement.
IMPRESSION: 1. Negative for hydronephrosis. Cortex appears slightly echogenic
suggestive of medical renal disease
2. Enlarged prostate
